# Patient Record
Sex: Male | Born: 1955 | Hispanic: No | Marital: Married | Smoking: Current some day smoker
Health system: Southern US, Community
[De-identification: ages and names within clinical notes are randomized; demographics above are authoritative.]

## PROBLEM LIST (undated history)

## (undated) ENCOUNTER — Emergency Department (HOSPITAL_COMMUNITY): Admission: EM | Payer: 59 | Source: Home / Self Care

## (undated) DIAGNOSIS — R002 Palpitations: Secondary | ICD-10-CM

## (undated) DIAGNOSIS — J189 Pneumonia, unspecified organism: Secondary | ICD-10-CM

## (undated) DIAGNOSIS — N289 Disorder of kidney and ureter, unspecified: Secondary | ICD-10-CM

## (undated) DIAGNOSIS — K219 Gastro-esophageal reflux disease without esophagitis: Secondary | ICD-10-CM

## (undated) DIAGNOSIS — J45909 Unspecified asthma, uncomplicated: Secondary | ICD-10-CM

## (undated) DIAGNOSIS — E785 Hyperlipidemia, unspecified: Secondary | ICD-10-CM

## (undated) DIAGNOSIS — R338 Other retention of urine: Secondary | ICD-10-CM

## (undated) DIAGNOSIS — I10 Essential (primary) hypertension: Secondary | ICD-10-CM

## (undated) DIAGNOSIS — M19012 Primary osteoarthritis, left shoulder: Secondary | ICD-10-CM

## (undated) DIAGNOSIS — J449 Chronic obstructive pulmonary disease, unspecified: Secondary | ICD-10-CM

## (undated) DIAGNOSIS — Z86010 Personal history of colonic polyps: Secondary | ICD-10-CM

## (undated) DIAGNOSIS — M1990 Unspecified osteoarthritis, unspecified site: Secondary | ICD-10-CM

## (undated) HISTORY — DX: Personal history of colonic polyps: Z86.010

## (undated) HISTORY — DX: Hyperlipidemia, unspecified: E78.5

## (undated) HISTORY — PX: CERVICAL FUSION: SHX112

---
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
## 1999-05-14 ENCOUNTER — Emergency Department (HOSPITAL_COMMUNITY): Admission: EM | Admit: 1999-05-14 | Discharge: 1999-05-14 | Payer: Self-pay | Admitting: Emergency Medicine

## 1999-05-14 ENCOUNTER — Encounter: Payer: Self-pay | Admitting: Emergency Medicine

## 1999-12-25 ENCOUNTER — Encounter: Payer: Self-pay | Admitting: Emergency Medicine

## 1999-12-25 ENCOUNTER — Emergency Department (HOSPITAL_COMMUNITY): Admission: EM | Admit: 1999-12-25 | Discharge: 1999-12-25 | Payer: Self-pay | Admitting: Emergency Medicine

## 2000-06-12 ENCOUNTER — Emergency Department (HOSPITAL_COMMUNITY): Admission: EM | Admit: 2000-06-12 | Discharge: 2000-06-12 | Payer: Self-pay | Admitting: Emergency Medicine

## 2001-08-30 ENCOUNTER — Encounter: Payer: Self-pay | Admitting: Emergency Medicine

## 2001-08-30 ENCOUNTER — Emergency Department (HOSPITAL_COMMUNITY): Admission: EM | Admit: 2001-08-30 | Discharge: 2001-08-30 | Payer: Self-pay | Admitting: Emergency Medicine

## 2002-02-02 ENCOUNTER — Emergency Department (HOSPITAL_COMMUNITY): Admission: EM | Admit: 2002-02-02 | Discharge: 2002-02-02 | Payer: Self-pay | Admitting: Emergency Medicine

## 2002-02-04 ENCOUNTER — Emergency Department (HOSPITAL_COMMUNITY): Admission: EM | Admit: 2002-02-04 | Discharge: 2002-02-04 | Payer: Self-pay | Admitting: Emergency Medicine

## 2002-10-16 ENCOUNTER — Encounter: Payer: Self-pay | Admitting: *Deleted

## 2002-10-16 ENCOUNTER — Emergency Department (HOSPITAL_COMMUNITY): Admission: EM | Admit: 2002-10-16 | Discharge: 2002-10-16 | Payer: Self-pay | Admitting: *Deleted

## 2006-02-07 ENCOUNTER — Encounter (INDEPENDENT_AMBULATORY_CARE_PROVIDER_SITE_OTHER): Payer: Self-pay | Admitting: Cardiology

## 2006-02-07 ENCOUNTER — Ambulatory Visit: Payer: Self-pay | Admitting: *Deleted

## 2006-02-07 ENCOUNTER — Observation Stay (HOSPITAL_COMMUNITY): Admission: EM | Admit: 2006-02-07 | Discharge: 2006-02-08 | Payer: Self-pay | Admitting: Emergency Medicine

## 2006-02-21 ENCOUNTER — Ambulatory Visit: Payer: Self-pay | Admitting: *Deleted

## 2006-03-22 ENCOUNTER — Ambulatory Visit: Payer: Self-pay | Admitting: Cardiology

## 2006-03-22 ENCOUNTER — Ambulatory Visit: Payer: Self-pay

## 2006-05-05 ENCOUNTER — Ambulatory Visit: Payer: Self-pay | Admitting: Cardiology

## 2006-05-25 ENCOUNTER — Inpatient Hospital Stay (HOSPITAL_COMMUNITY): Admission: EM | Admit: 2006-05-25 | Discharge: 2006-05-29 | Payer: Self-pay | Admitting: Emergency Medicine

## 2006-05-25 ENCOUNTER — Ambulatory Visit: Payer: Self-pay | Admitting: Internal Medicine

## 2006-06-21 ENCOUNTER — Encounter (INDEPENDENT_AMBULATORY_CARE_PROVIDER_SITE_OTHER): Payer: Self-pay | Admitting: Internal Medicine

## 2006-06-21 ENCOUNTER — Ambulatory Visit: Payer: Self-pay | Admitting: Internal Medicine

## 2006-06-21 DIAGNOSIS — H524 Presbyopia: Secondary | ICD-10-CM

## 2006-06-21 DIAGNOSIS — G4700 Insomnia, unspecified: Secondary | ICD-10-CM | POA: Insufficient documentation

## 2006-06-21 DIAGNOSIS — I10 Essential (primary) hypertension: Secondary | ICD-10-CM | POA: Insufficient documentation

## 2006-06-21 DIAGNOSIS — L03319 Cellulitis of trunk, unspecified: Secondary | ICD-10-CM

## 2006-06-21 DIAGNOSIS — E785 Hyperlipidemia, unspecified: Secondary | ICD-10-CM | POA: Insufficient documentation

## 2006-06-21 DIAGNOSIS — F17200 Nicotine dependence, unspecified, uncomplicated: Secondary | ICD-10-CM

## 2006-06-21 DIAGNOSIS — L02219 Cutaneous abscess of trunk, unspecified: Secondary | ICD-10-CM | POA: Insufficient documentation

## 2006-06-21 LAB — CONVERTED CEMR LAB
BUN: 13 mg/dL (ref 6–23)
CO2: 23 meq/L (ref 19–32)
Creatinine, Ser: 1.49 mg/dL (ref 0.40–1.50)
Hemoglobin: 13 g/dL (ref 13.0–17.0)
MCHC: 33.9 g/dL (ref 30.0–36.0)
RBC: 4.26 M/uL (ref 4.22–5.81)
RDW: 14 % (ref 11.5–14.0)
WBC: 9.2 10*3/uL (ref 4.0–10.5)

## 2006-06-28 ENCOUNTER — Emergency Department (HOSPITAL_COMMUNITY): Admission: EM | Admit: 2006-06-28 | Discharge: 2006-06-29 | Payer: Self-pay | Admitting: Emergency Medicine

## 2006-07-05 ENCOUNTER — Ambulatory Visit: Payer: Self-pay | Admitting: Gastroenterology

## 2006-07-13 ENCOUNTER — Emergency Department (HOSPITAL_COMMUNITY): Admission: EM | Admit: 2006-07-13 | Discharge: 2006-07-13 | Payer: Self-pay | Admitting: Emergency Medicine

## 2006-10-13 ENCOUNTER — Emergency Department (HOSPITAL_COMMUNITY): Admission: EM | Admit: 2006-10-13 | Discharge: 2006-10-13 | Payer: Self-pay | Admitting: Family Medicine

## 2007-06-01 ENCOUNTER — Emergency Department (HOSPITAL_COMMUNITY): Admission: EM | Admit: 2007-06-01 | Discharge: 2007-06-01 | Payer: Self-pay | Admitting: Family Medicine

## 2007-06-20 ENCOUNTER — Emergency Department (HOSPITAL_COMMUNITY): Admission: EM | Admit: 2007-06-20 | Discharge: 2007-06-20 | Payer: Self-pay | Admitting: Emergency Medicine

## 2007-06-21 ENCOUNTER — Ambulatory Visit: Payer: Self-pay | Admitting: Internal Medicine

## 2007-06-26 ENCOUNTER — Encounter: Payer: Self-pay | Admitting: Internal Medicine

## 2007-06-26 ENCOUNTER — Encounter (INDEPENDENT_AMBULATORY_CARE_PROVIDER_SITE_OTHER): Payer: Self-pay | Admitting: Internal Medicine

## 2007-06-26 ENCOUNTER — Ambulatory Visit: Payer: Self-pay | Admitting: Internal Medicine

## 2007-08-27 ENCOUNTER — Emergency Department (HOSPITAL_COMMUNITY): Admission: EM | Admit: 2007-08-27 | Discharge: 2007-08-27 | Payer: Self-pay | Admitting: Family Medicine

## 2008-06-17 ENCOUNTER — Emergency Department (HOSPITAL_COMMUNITY): Admission: EM | Admit: 2008-06-17 | Discharge: 2008-06-17 | Payer: Self-pay | Admitting: Emergency Medicine

## 2008-09-02 ENCOUNTER — Emergency Department (HOSPITAL_COMMUNITY): Admission: EM | Admit: 2008-09-02 | Discharge: 2008-09-02 | Payer: Self-pay | Admitting: Emergency Medicine

## 2008-09-04 ENCOUNTER — Emergency Department (HOSPITAL_COMMUNITY): Admission: EM | Admit: 2008-09-04 | Discharge: 2008-09-04 | Payer: Self-pay | Admitting: Emergency Medicine

## 2010-05-04 ENCOUNTER — Emergency Department (HOSPITAL_COMMUNITY)
Admission: EM | Admit: 2010-05-04 | Discharge: 2010-05-04 | Payer: Self-pay | Source: Home / Self Care | Admitting: Emergency Medicine

## 2010-05-04 LAB — DIFFERENTIAL
Neutro Abs: 3.8 10*3/uL (ref 1.7–7.7)
Neutrophils Relative %: 42 % — ABNORMAL LOW (ref 43–77)

## 2010-05-04 LAB — COMPREHENSIVE METABOLIC PANEL
AST: 25 U/L (ref 0–37)
Albumin: 3.6 g/dL (ref 3.5–5.2)
Calcium: 9.3 mg/dL (ref 8.4–10.5)
Creatinine, Ser: 1.13 mg/dL (ref 0.4–1.5)
Glucose, Bld: 82 mg/dL (ref 70–99)
Potassium: 4 mEq/L (ref 3.5–5.1)
Sodium: 140 mEq/L (ref 135–145)
Total Bilirubin: 0.3 mg/dL (ref 0.3–1.2)

## 2010-05-04 LAB — LIPASE, BLOOD: Lipase: 23 U/L (ref 11–59)

## 2010-05-04 LAB — CBC: MCH: 32.6 pg (ref 26.0–34.0)

## 2010-05-05 ENCOUNTER — Encounter (HOSPITAL_COMMUNITY): Payer: Self-pay | Attending: Oncology

## 2010-05-05 DIAGNOSIS — K625 Hemorrhage of anus and rectum: Secondary | ICD-10-CM | POA: Insufficient documentation

## 2010-05-05 DIAGNOSIS — Z8711 Personal history of peptic ulcer disease: Secondary | ICD-10-CM | POA: Insufficient documentation

## 2010-07-12 LAB — URINALYSIS, ROUTINE W REFLEX MICROSCOPIC
Bilirubin Urine: NEGATIVE
Glucose, UA: NEGATIVE mg/dL
Hgb urine dipstick: NEGATIVE
Ketones, ur: NEGATIVE mg/dL
Nitrite: NEGATIVE
Protein, ur: NEGATIVE mg/dL
Specific Gravity, Urine: 1.015 (ref 1.005–1.030)
Urobilinogen, UA: 0.2 mg/dL (ref 0.0–1.0)
pH: 5.5 (ref 5.0–8.0)

## 2010-07-12 LAB — DIFFERENTIAL
Eosinophils Absolute: 0.7 10*3/uL (ref 0.0–0.7)
Lymphocytes Relative: 26 % (ref 12–46)
Monocytes Absolute: 0.5 10*3/uL (ref 0.1–1.0)
Monocytes Relative: 4 % (ref 3–12)
Neutrophils Relative %: 62 % (ref 43–77)

## 2010-07-12 LAB — CBC
HCT: 43.9 % (ref 39.0–52.0)
Platelets: 161 10*3/uL (ref 150–400)
RBC: 4.72 MIL/uL (ref 4.22–5.81)

## 2010-07-12 LAB — BASIC METABOLIC PANEL
BUN: 10 mg/dL (ref 6–23)
CO2: 29 mEq/L (ref 19–32)
Calcium: 9.6 mg/dL (ref 8.4–10.5)
Chloride: 100 mEq/L (ref 96–112)
Creatinine, Ser: 1.24 mg/dL (ref 0.4–1.5)
GFR calc Af Amer: >60 mL/min (ref >60)
GFR calc non Af Amer: >60 mL/min (ref >60)
Glucose, Bld: 93 mg/dL (ref 70–99)
Potassium: 4.2 mEq/L (ref 3.5–5.1)
Sodium: 136 mEq/L (ref 135–145)

## 2010-07-15 LAB — CBC
MCHC: 34.1 g/dL (ref 30.0–36.0)
MCV: 94.3 fL (ref 78.0–100.0)
WBC: 7.9 10*3/uL (ref 4.0–10.5)

## 2010-07-15 LAB — URINALYSIS, ROUTINE W REFLEX MICROSCOPIC
Glucose, UA: NEGATIVE mg/dL
Ketones, ur: NEGATIVE mg/dL
Leukocytes, UA: NEGATIVE
Specific Gravity, Urine: 1.02 (ref 1.005–1.030)
Urobilinogen, UA: 0.2 mg/dL (ref 0.0–1.0)

## 2010-07-15 LAB — DIFFERENTIAL
Basophils Absolute: 0 10*3/uL (ref 0.0–0.1)
Eosinophils Relative: 11 % — ABNORMAL HIGH (ref 0–5)
Monocytes Relative: 5 % (ref 3–12)

## 2010-07-15 LAB — BASIC METABOLIC PANEL
BUN: 10 mg/dL (ref 6–23)
GFR calc Af Amer: >60 mL/min (ref >60)
Glucose, Bld: 99 mg/dL (ref 70–99)
Potassium: 4.5 mEq/L (ref 3.5–5.1)
Sodium: 138 mEq/L (ref 135–145)

## 2010-07-15 LAB — LIPASE, BLOOD: Lipase: 15 U/L (ref 11–59)

## 2010-08-17 NOTE — Assessment & Plan Note (Signed)
[facility]                         GASTROENTEROLOGY OFFICE NOTE   JODEN, BONSALL                     MRN:          [id number]  DATE:06/21/2007                            DOB:          December 22, 1955    CHIEF COMPLAINT:  Blood in stool.   HISTORY:  This 55 year old African-American man was in the emergency  room last night, complaining of melena, or black stools.  In talking to  him, he has really been having some left-sided abdominal pain off and on  and some bloating and some difficulty moving his bowels.  He has had  bright red blood per rectum and darker red blood per rectum.  I am not  really sure he has had melena.  He has not been using any significant  amount of NSAIDs.  He has been using a colon cleanse, ordered from TV,  to help himself move his bowels.  His hemoglobin was 14 when he was in  the emergency department.  They called Dr. Juanda Chance, who was on call, and  he was told to come see [place] in the office today for further evaluation.  The ER records indicate he was having a burning epigastric pain.  His  INR was normal.  His BUN was 12 and his creatinine was 1.2.  He had been  on some Zantac.  He was prescribed Protonix and Percocet last night,  which he has not yet started.  He says he has a history of ulcer disease  in 29.   MEDICATIONS:  1. Protonix 40 mg daily, not yet started.  2. Vitamin E.  3. A cardio supplement.  4. Colon cleanse intermittently.  5. Percocet p.r.n.   ALLERGIES:  He has a SHELLFISH and BEE STING allergies, but no known  drug allergies.  Also IODINE and increased pruritus after administration  of CONTRAST for CT angio.   PAST MEDICAL HISTORY:  Hypertension, epididymitis, inguinal abscess,  cellulitis.  Multiple sexually-transmitted diseases.  Dyslipidemia.  Childhood asthma.   PAST SURGICAL HISTORY:  Left-wrist tendon repair, 1998.  Left  arthroscopic knee surgery, 2001.   FAMILY HISTORY:  Grandmother  and a brother have had some type of  bleeding problems, but I do not think that was a bleeding disorder; I  think they had bleeding.   Our review is otherwise negative.   SOCIAL HISTORY:  He indicates that he is separated.  Three sons, two  daughters.  He lives with another woman.  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  He smokes one pack per day.  He denies current drug use or  alcohol.   REVIEW OF SYSTEMS:  Sinus problems, cough, joint pain, insomnia,  palpitations, eyeglasses and visual difficulty.  All other systems are  negative.   PHYSICAL EXAM:  Height 5 feet 11, weight 229 pounds.  Blood pressure  130/78, pulse 64.  EYES:  Anicteric.  ENT:  Shows poor dentition, missing some, and those that remain are  carious.  NECK:  Supple without thyromegaly or mass.  LUNGS:  Clear.  HEART:  S1, S2, no murmurs or gallops.  ABDOMEN:  Soft, nontender, without organomegaly or mass, except for some  minimal left lower quadrant tenderness.  RECTAL EXAM:  Shows no significant stool.  There is perhaps a little  scanty, brown stool.  At the tip of my finger, there may be a polypoid  lesion or perhaps even a mass.  GU:  His prostate is normal.  LYMPHATIC:  No neck, supraclavicular or groin adenopathy.  EXTREMITIES:  Free of edema.  SKIN:  No acute rash.  NEUROLOGIC:  Cranial nerves II through XII intact.  PSYCHIATRIC:  He is alert and oriented times three, appropriate mood and  affect.   ASSESSMENT:  It sounds like he is having blood in his stool and some  change in bowels, for which he started the colon cleanse.  He has had  rectal bleeding off and on for a number of years.  With the normal BUN,  I am less suspicious of the history that was gotten over the phone that  he might have an upper GI bleed.   PLAN:  Schedule colonoscopy.  An EGD cold be indicated, pending those  results.  Risks, benefits, and indications are explained.  He  understands and agrees to proceed.     Iva Boop, MD,FACG  Electronically Signed    CEG/MedQ  DD: 06/22/2007  DT: 06/22/2007  Job #: [id number]

## 2010-08-20 NOTE — Assessment & Plan Note (Signed)
[facility]                            CARDIOLOGY OFFICE NOTE   Brandon Ferrell, Brandon Ferrell                     MRN:          [id number]  DATE:05/05/2006                            DOB:          09-15-1955    Today's date is May 05, 2006.   Brandon Ferrell returns today for follow-up of his hypertension and  hyperlipidemia. Please see my note from March 22, 2006.   He had an exercise treadmill which showed physical deconditioning and an  exaggerated blood pressure response to exercise. I placed him on  lisinopril & hydrochlorothiazide and also recommended a Statin. He is  taking lisinopril & hydrochlorothiazide 25/25 daily. He is on  Atorvastatin, unknown dose daily.   He has lost 7 pounds and has begun a work out. He seems very motivated.  He is a former high school wrestler.   VITAL SIGNS: His blood pressure today is 135/87, his pulse is 64 and  regular. He is in no acute distress.  LUNGS: Clear.  NECK: There is no JVD. Carotids are full without bruits.  HEART: Reveals a regular rate and rhythm.  ABDOMEN: Soft, good bowel sounds.  EXTREMITIES: Reveal no edema. Pulses are brisk.   EKG today shows sinus bradycardia with nonspecific ST segment changes.   I have reinforced [name] today to continue his medications. I am  delighted that he has undertaken an exercise program and he is losing  weight. He is set to get lipids and a comprehensive metabolic panel in 4  weeks. I will see him back in 6 weeks.     Thomas C. Daleen Squibb, MD, [facility]  Electronically Signed    TCW/MedQ  DD: 05/05/2006  DT: 05/05/2006  Job #: [id number]

## 2010-08-20 NOTE — Op Note (Signed)
NAME:  Brandon Ferrell, Brandon Ferrell NO.:  [id number]   MEDICAL RECORD NO.:  [id number]          PATIENT TYPE:  INP   LOCATION:  5123                         FACILITY:  [facility]   PHYSICIAN:  Alfonse Ras, MD   DATE OF BIRTH:  1955-12-06   DATE OF PROCEDURE:  05/25/2006  DATE OF DISCHARGE:                               OPERATIVE REPORT   SURGEON:  Alfonse Ras, MD.   PROCEDURE:  Incision and drainage of left groin abscess.   ANESTHESIA:  Local.   DESCRIPTION:  I was asked to see the patient by the family practice  teaching service.  The patient had a superficial abscess in the left  inguinal crease, which was anesthetized with 1% lidocaine, incised, and  a small amount of purulent material was drained.  It was packed with  0.25% Iodoform.  Wound care instructions were recommended to the  teaching service to remove the packing in 2 days, continue antibiotics,  and probably discharge home within the next 24 or 48 hours.  I would be  happy to see him back p.r.n.      Alfonse Ras, MD  Electronically Signed     KRE/MEDQ  D:  05/25/2006  T:  05/26/2006  Job:  [id number]

## 2010-08-20 NOTE — Procedures (Signed)
[facility]                              EXERCISE TREADMILL   Brandon Ferrell                       MRN:          [id number]  DATE:03/22/2006                            DOB:          1955/04/10    HISTORY OF PRESENT ILLNESS:  Brandon Ferrell is a 55 year old black  gentleman who was recently admitted to [facility] with chest  pain.  He ruled out for myocardial infarction.  He has multiple risk  factors for cardiovascular disease including age, male sex, cocaine use,  and heavy tobacco use.  He also was borderline hypertensive in the  hospital.   His echocardiogram was essentially normal.   He was set up for an exercise treadmill to rule out obstructive coronary  disease.   His baseline blood pressure is 149/108, pulse was 57 and regular.  He  had just smoked a cigarette and rushed to the office and was late.   His baseline EKG was normal.  We exercised him on the standard Bruce  protocol.  He exercised for a total of 5 minutes, reaching a maximum  heart rate of 148 which is 87% of predicted maximum heart rate.  His MET  level achieved was 8.0.  Blood pressure increased to 228/95.  In  recovery, it dropped to 176/90.   There were no electrocardiographic changes of ischemia or arrhythmias.   1. Negative exercise tolerance test.  2. Baseline hypertension with exaggerated blood pressure response to      exercise.  3. Tobacco use.  4. Drug abuse.   I have had a long talk with Brandon Ferrell.  I have recommended  lisinopril/hydrochlorothiazide for his blood pressure.  He was given a  prescription for lisinopril/hydrochlorothiazide 20/25 daily.  He can get  this for several dollars a month at [facility].  He is also on generic  simvastatin which he can receive for the same price.  I will plan on  seeing him back in 2-3 weeks.     Thomas C. Daleen Squibb, MD, [facility]  Electronically Signed    TCW/MedQ  DD: 03/22/2006  DT: 03/22/2006  Job #:  [id number]

## 2010-08-20 NOTE — Discharge Summary (Signed)
NAME:  Brandon Ferrell, Brandon Ferrell NO.:  [id number]   MEDICAL RECORD NO.:  [id number]          PATIENT TYPE:  INP   LOCATION:  5123                         FACILITY:  [facility]   PHYSICIAN:  Carlus Pavlov, M.D. DATE OF BIRTH:  17-Jun-1955   DATE OF ADMISSION:  05/25/2006  DATE OF DISCHARGE:  05/29/2006                               DISCHARGE SUMMARY   DISCHARGE DIAGNOSES:  1. Inguinal abscess/cellulitis.  2. Epididymitis.  3. Ongoing drug abuse (crack, smoked).  4. Tobacco abuse.  5. History of chest pain that was ruled out for acute myocardial      infarction in November 2007 (by 2D echo, by EKG, by normal cardiac      enzymes and by a negative Cardiolite).  6. History of bright red blood per rectum with normal hemoglobin.  7. History of recurrent postcoital left testicular pain.  8. History of multiple sexually transmitted diseases.  9. Hyperlipidemia.  10.Bradycardia.  11.History of childhood asthma.  12.Status post left wrist tendon repair in 1998.  13.Status post left arthroscopic knee surgery in 2001.   ALLERGIES:  IODINE, BEE STINGS, SHELLFISH.  HE HAD INCREASED PRURITUS  AFTER ADMINISTRATION OF CONTRAST SUBSTANCE FOR A CT ANGIO.   MEDICATIONS AT DISCHARGE:  1. Pravastatin 40 mg p.o. daily.  2. Percocet 1-2 tablets every 4 hours p.r.n. pain.  The patient was      advised to take 2 tablets 30 minutes before dress changes.  3. Lasix 40 mg p.o. daily.  4. Ibuprofen 800 mg p.o. every 6 hours p.r.n. pain (not to be taken      for more than one week).  5. Doxycycline 100 mg p.o. b.i.d. for 7 days.  The patient was      provided with samples of doxycycline.   FOLLOWUP:  The patient has an appointment with Dr. Elvera Lennox in the  outpatient clinic on June 21, 2006, at 1:45 pm.  At that time, Dr.  Elvera Lennox is advised to check a BMET and a CBC.  Also she should check  resolution of the inguinal abscess.  The patient was advised by surgery  how to proceed with wound care  and himself and his girlfriend received  precise instructions about the dressings.   PROCEDURE:  A CT of the abdomen with contrast showed no acute finding in  the abdomen, and CT of the pelvis with contrast showed anterior midline  symphysis and perineal soft tissue thickening, edema and inflammation  with adjacent mild left inguinal adenopathy suspicious for an  inflammatory infectious process, less likely trauma.  This also involves  the left hemiscrotum.  An ultrasound of the scrotum showed normal testicles, right epididymal  space, increased  color flow of the left epididymis consistent with  epididymitis and left varicocele.  All these studies were done May 25, 2006.   CONSULTATIONS:  Dr. Colin Benton with surgery.   BRIEF ADMITTING HISTORY AND PHYSICAL:  Mr. Sayed is a 55 year old  African-American male with a past medical history of furunculosis,  hyperlipidemia, crack cocaine abuse, recent history of chest pain was  recently ruled out for acute MI, and  with history of repeated STDs in  the past, now presenting with a week and a half of an inguinal furuncle  that he initially drained himself.  It drained a purulent fluid and then  blood.  Four days prior to admission, he started to feel a nut-sized  mass in the same place that was increasing in size day by day.  The  night prior to admission, the patient was awoken from sleep by 25/10  inguinal pain.  He felt fever, chills, and diaphoresis.  He came to the  ED in the morning.  He also complains that the skin of the of left  testicle being indurated and painful.  The pain is burning and radiates  down his left leg.  He feels a pulling sensation when he extends the  left toe.  Pain is better when scrotum is elevated. He has no discharge  from the penis and no testicle pain.   PHYSICAL EXAMINATION:  VITAL SIGNS:  On admission, temperature 99.3.  Blood pressure 146/94.  Pulse 76.  Respiration rate 18.  Oxygen  saturation 97% on  room air.  GENERAL:  The patient was resting in bed, complaining of pain, wincing  when turning in bed.  EYES:  PERRLA with myotic pupils (the patient received Dilaudid).  Extraocular movements intact.  Arcus senilis.  ENT:  Clear oropharyngeal and membranes.  Enlarged tonsils.  Poor  dentition.  NECK:  Supple, no thyromegaly.  RESPIRATORY:  Clear to auscultation bilaterally.  CARDIOVASCULAR:  Bradycardia, no murmurs, rubs or gallops.  GI:  Soft, nontender, nondistended.  Bowel sounds positive.  EXTREMITIES:  No edema.  GU:  No CVA tenderness.  Abscess in left inguinal fold, elongated,  indurated.  Skin was broken in one place, but not draining.  The skin on  scrotum was indurated.  The pain was better with lifting the scrotum  (Prehn sign).  Marland Kitchen  SKIN:  Moist.  MENTAL STATUS:  He was alert and oriented x3.  NEURO:  Cranial nerves II-XII grossly intact. Nonfocal.  Strength 5/5 in  all four.  MUSCULOSKELETAL:  Increased muscular tone.  He has a knot in the left  lumbar area which was painful to pressing.  PSYCH:  Appropriate.   LABORATORY DATA:  Sodium 138, potassium 3.9, chloride 104, bicarbonate  24, BUN 11, creatinine 1.31, glucose 96 and anion gap 10, AST 21, ALT  16, calcium 9.1, white blood cells 12.4 with an ANC of 7.2, hemoglobin  12.7, hematocrit 37.3, thrombocytes 214, MCV 93.  Urinalysis:  The urine  was clear, pH 6.5, density 1.028.  The rest was negative.  UDS was  positive for cocaine.  GC and chlamydia in urine were negative.  RPR was  nonreactive.  Blood cultures x2 were negative and urine culture x1 was  negative.   ASSESSMENT/PLAN:  1. Left inguinal abscess/cellulitis.  On clinical exam, the inguinal      region looked inflamed, with a punctiform break in the skin      overlying the abscess, however it was not draining; on palpation,      there is an indurated mass of about 1 cm per 4 cm.  It was felt     that this was an abscess as opposed to cellulitis.  However  on the      CT angiography, it looked like a cellulitis without an abscess      collection.  Due to the fact that it was in the inguinal region and  has likely spread to the scrotum, we were concerned for Fournier      gangrene and we called surgery for possible drainage.  We have      medicated the patient with Dilaudid p.r.n. and empiric antibiotics      initially with vancomycin and Zosyn and then we switched to p.o.      doxycycline.  The patient was in considerable pain.  We had      increased the dose of Dilaudid during his hospital stay and we have      alternated with Percocet.  The patient was discharged on Percocet.      Towards the end of his stay, his pain started to diminish.  He was      incised and drained by surgery who were able to express a small      quantity of pus from the abscess; however, cultures were not sent      so we do not have sensitivities and a specific bacteria to cover.      We have discharged the patient on doxycycline for concern of      methicillin-resistant Staphylococcus aureus.   1. Epididymitis.  This is possibly a consequence of the spreading      abscess; however, we have tested for [facility] and chlamydia since the      patient had repeated sexually transmitted diseases in the past.  We      have advised him to keep scrotum elevated and to use ice packings.      He received Solu-Medrol 125 mg intravenous in the emergency      department.  We also started him in ibuprofen 800 t.i.d. and      administered Dilaudid and Percocet.  Towards the end of his stay,      his edema decreased and his pain started to resolve.   1. Ongoing substance abuse (smokes crack).  Patient was advised about      risks of this behavior especially in the context of a previous      chest pain.  He says he tries to cut down.  He was, however,      advised to stop completely.  At previous admissions, he has been      referred to Narcotics Anonymous obviously without full  effect (UDS      positive for cocaine at this admission).   1. Tobacco abuse.  We asked for a smoking cessation consult.  We are      providing him with a NicoDerm patch.   1. History of chest pain.  He was admitted in November of last year      and he was ruled out for myocardial infarction by EKG, 2D echo,      cardiac enzymes and also negative Cardiolite in outpatient.  We      checked an EKG that showed sinus bradycardia and also specific ST      and T-wave abnormalities.  This was unchanged since his EKG in      November of 2007.  One set of cardiac enzymes were normal.  The      patient was bradycardic his entire admission and this is likely      patient's baseline as he exercises on a regular basis.   1. Hyperlipidemia.  We have checked a fasting lipid profile that      showed total cholesterol 216, LDL cholesterol of 156 and HDL      cholesterol of 44 and  triglycerides of 81.  We have continued his     home Lipitor in the hospital, 40 mg daily however at discharge we      have placed him on Pravastatin secondary to monetary problems.      Pravastatin is $4.00 at [facility].   1. Hypertension.  He is on Lasix at home and we will continue this      regimen 40 mg daily.  He was very well controlled in the hospital      on Lasix 40 mg daily.   1. Prophylaxis.  He was on Protonix and he did not necessitate Lovenox      since he was ambulating well.   VITALS AT DISCHARGE:  Temperature 98.7, pulse 56, respirations 16, blood  pressure 139/60, oxygen saturation 96 on room air.  White blood count  9.5, neutrophils 46%, hemoglobin 13.8, hematocrit 39.2, platelets 242,  BMET one day before admission showed sodium 135, potassium 3.5, chloride  101, bicarbonate 29, glucose 89, BUN 9, creatinine 1.15, calcium 8.1.      Carlus Pavlov, M.D.  Electronically Signed     CG/MEDQ  D:  05/31/2006  T:  06/01/2006  Job:  [id number]   cc:   Alfonse Ras, MD

## 2010-08-20 NOTE — Discharge Summary (Signed)
NAME:  Brandon Ferrell, Brandon Ferrell NO.:  [id number]   MEDICAL RECORD NO.:  [id number]          PATIENT TYPE:  INP   LOCATION:  4705                         FACILITY:  [facility]   PHYSICIAN:  Antony Contras, M.D.     DATE OF BIRTH:  1955-08-03   DATE OF ADMISSION:  02/07/2006  DATE OF DISCHARGE:  02/08/2006                                 DISCHARGE SUMMARY   DISCHARGE DIAGNOSES:  1. Chest pain, rule out for myocardial infarction.  Needs further workup      for potential unstable angina.  2. Crack cocaine abuse.  3. Ongoing tobacco abuse.  4. History of bright red blood per rectum, with normal hemoglobin.  5. History of recurrent postcoital left testicular pain.  6. Bradycardia, asymptomatic, and potentially the patient's baseline.  7. No established health care.   DISCHARGE MEDICATIONS:  1. Zocor 20 mg p.o. daily.  2. Aspirin 81 mg p.o. daily.  3. Nitroglycerin 0.4 mg sublingual every 5 minutes x3 p.r.n. chest pain.   DISPOSITION AND FOLLOWUP:  Brandon Ferrell is being discharged from [facility] in stable condition.  His chest pain has resolved.  He is to follow  up with Dr. Manning Charity in the [facility] outpatient clinic on February 21, 2006, at 3:00 p.m.  At that time, the stress test being coordinated by  [facility] will ideally be complete and the results available for  Dr. Lyda Perone to review.  The patient will need a colonocopy at some point as  well.  Also for  Dr. Lyda Perone to decide if the patient should remain on statin  therapy given his LDL level of 122 and the risk factors the patient has.   On the day of discharge, [facility] was contacted regarding the  patient having an outpatient stress test.  Their card master will arrange  with the patient to have this set up, ideally within the next 7 days.  [facility] will call the patient with the time and date of his  appointment.   PROCEDURES PERFORMED:  1. On February 07, 2006, the patient had  a chest x-ray that revealed no      active cardiopulmonary disease.  2. On February 07, 2006, the patient had a 2-D echocardiogram performed,      which revealed the following:      a.     Left ventricular systolic function was normal with an EF between       55% and 65%.      b.     No regional wall motion abnormality was identified, but could       not be completely excluded on the basis of this study.      c.     Left ventricular wall thickness moderately to markedly       increased.      d.     Left ventricular diastolic function parameters normal.      e.     Aortic root upper limits of normal in size.      f.     Atrial size  upper limits of normal.   CONSULTATIONS:  [facility] was contacted regarding establishment of  an outpatient stress test.   BRIEF ADMISSION HISTORY AND PHYSICAL:  Brandon Ferrell is a pleasant 55 year old  African American man with a past medical history of approximately 40-pack-  year smoking history, as well as ongoing crack cocaine abuse, who presents  with a 2-day history of chest pain.  The pain is located in the central  chest with radiation into the left shoulder and left upper arm.  The patient  first noticed the chest pain beginning approximately 1 month ago after  exerting himself physically.  The pain, the patient reports, lasts  approximately 10 to 15 minutes and typically resolves on its own.  One day  prior to arrival, the patient was lying in bed when he had 12 out of 10  chest pain radiating into the left arm and shoulder.  It resolved on its own  after about 10 minutes.  He went to sleep, but awoke the next morning with  cough, chest tightness, nausea, diaphoresis, and generalized malaise.  The  chest pain was now persistent.  It had been relieved by morphine  administrated in the emergency department by the time I evaluated the  patient.   On review of systems, he also reported shortness of breath during the chest  pain, as well as a  cough that is productive of dark sputum.  No  hemoptysis.  On further review of systems, the patient reports that he has  left testicular pain postcoitally.  There is no discharge and no dysuria.  He does have a history of multiple sexually transmitted infections in the  past.  The patient also reported 1 episode of bright red blood per rectum.  He has a history of hemorrhoidal bleeds.  He has never had a colonoscopy.  He quantifies the blood as drops of blood in the toilet following a normal  caliber stool and drops of blood on the toilet paper following cleaning  himself.  For further details, please see the chart.   PAST MEDICAL HISTORY:  The patient has childhood asthma that was resolved.  He has had a left wrist tendon repair in 1998.  He had a left arthroscopic  knee surgery in 2001.  No further past medical history.   PHYSICAL EXAMINATION:  VITAL SIGNS:  Temperature 97.6.  Blood pressure  139/89.  Pulse 57.  Respiratory rate 18.  O2 sat 98% on room air.  GENERAL:  This is a well-developed, well-nourished African American man  resting comfortable in bed in no apparent distress.  HEENT:  He had 1+ conjunctival injection.  Extraocular muscles intact.  Pupils equally round and reactive to light.  He had arcus senilis  bilaterally.  Oropharynx revealed very poor dentition.  He had no erythema  and no exudate.  NECK:  Supple and nontender.  He had no notable JVD or thyromegaly.  LUNGS:  Good air movement.  Clear to auscultation bilaterally.  No wheezes,  rhonchi, or rales.  CARDIOVASCULAR:  He was bradycardic, but regular.  S1, S2.  No murmurs,  rubs, or gallops.  ABDOMEN:  Normoactive bowel sounds, soft, nontender, and nondistended.  EXTREMITIES:  No cyanosis, clubbing, or edema.  SKIN:  Warm and dry.  LYMPHATIC:  He had no lymphadenopathy appreciated. NEUROLOGIC:  He was moving all 4 extremities spontaneously.  He was alert  and oriented x3.  He had no focal neurological deficits.   He had normal  sensation  throughout.  His mood was best described as anxious. His affect  was full.   ADMISSION LABORATORY DATA:  Point-of-care cardiac enzymes were normal.  Myoglobin 89.  CK-MB 1.8.  Troponin less than 0.05.  Sodium of 139.  Potassium 4.1.  Chloride 108.  Bicarb 27.2.  BUN 13.  Creatinine 1.4.  Glucose 89.  He had a white count of 8.8.  Hemoglobin 14.  Platelets 208.  A1c of 3.7.  ANC 3.7.  MCV of 93.  His urine drug screen was positive for  opiates, as well as cocaine.  His fecal occult blood negative.  Of note, the  patient did receive morphine in the ED approximately 1/2 of an hour before  the urine drug screen.   HOSPITAL COURSE:  1. Chest pain.  The patient was admitted for 23-hour observation to a      telemetry bed.  He had several sets of cardiac markers cycled, all of      which were normal.  He had several repeat EKGs, all of which were      normal.  There were no ST changes that were concerning.  He did have      some nonspecific T-wave changes in lead 3; otherwise, it was      unremarkable, other than the bradycardia, as mentioned.  The treatment      team felt comfortable, based on the patient's cardiac risk factors, for      allowing the patient to go home, which he was quite adamant about.      Given his history of crack cocaine abuse, as well as the story of being      so concerning for angina, we set him up for a stress test.  [facility] gratefully assisted in setting this up.  Ideally, those      results will be available by the time he sees Dr. Lyda Perone at the      outpatient clinic.   1. Bradycardia.  Throughout this hospitalization, the patient's heart rate      was noted to be in the mid to low 50s.  The patient was entirely stable      throughout his hospitalization and was asymptomatic from the      bradycardia.  One could presume this is from parasympathetic tone.  The      patient is notably in shape despite his ongoing crack  cocaine abuse.      He does work out several days a week.  I think this represents his      baseline.   1. Polysubstance abuse.  The patient was counseled while in-house by      alcohol and drug services and offered materials on drug abuse and      addiction.  He was also referred to Narcotics Anonymous.  The patient      reports that this hospitalization was a scare/wake-up call for him.  I      think his involvement in Narcotics Anonymous will represent his best      chance of remaining drug free.   1. Hyperlipidemia.  The patient was discharged on statin therapy.  A      fasting lipid panel while in-house revealed the following:  total      cholesterol of 210, LDL of 122, HCL of 35, and triglycerides of 260.  I      think depending on his risk factors, he could or could not stand to  be      on a statin.  Again, I will defer to his primary care physician, which     at this time is Dr. Lyda Perone; however, I will be more than happy to see him      as a continuity patient in the outpatient clinic.   1. Bright red blood per rectum.  The patient's description of the bleeding      is most consistent with hemorrhoidal bleeding; however, given his age      and the fact that he has never had any established health care, I think      it would be best to set the patient up for an outpatient colonoscopy.      Either Dr. Lyda Perone or myself can do this.  Of note, the patient's      hemoglobin was entirely normal throughout the course of his      hospitalization.  Discharge was 13.2.   DISCHARGE LABS:  White blood cell count 10.8.  Hemoglobin 13.2.  Platelets  208.  Sodium 139.  Potassium 3.8.  Chloride 112.  Bicarb 26.  BUN 12.  Creatinine 1.5.  Glucose 96.  Cardiac markers were all normal.  GC negative.  HIV nonreactive.  Chlamydia negative.  Syphilis RPR was negative.      Antony Contras, M.D.  Electronically Signed     GL/MEDQ  D:  02/09/2006  T:  02/10/2006  Job:  [id number]

## 2010-12-27 LAB — DIFFERENTIAL
Basophils Absolute: 0.1
Basophils Relative: 1
Monocytes Relative: 6
Neutro Abs: 4.7
Neutrophils Relative %: 49

## 2010-12-27 LAB — COMPREHENSIVE METABOLIC PANEL
Alkaline Phosphatase: 59
BUN: 12
Glucose, Bld: 116 — ABNORMAL HIGH
Potassium: 4.1
Total Bilirubin: 0.5
Total Protein: 6.5

## 2010-12-27 LAB — CBC
HCT: 40.7
Hemoglobin: 14.2
MCV: 92.6
RDW: 13.6

## 2010-12-27 LAB — PROTIME-INR
INR: 0.9
Prothrombin Time: 12.7

## 2011-04-05 HISTORY — PX: LUNG BIOPSY: SHX232

## 2011-06-21 ENCOUNTER — Encounter (HOSPITAL_COMMUNITY): Payer: Self-pay | Admitting: *Deleted

## 2011-06-21 ENCOUNTER — Emergency Department (HOSPITAL_COMMUNITY)
Admission: EM | Admit: 2011-06-21 | Discharge: 2011-06-21 | Disposition: A | Discharge status: Home / Self Care | Payer: Self-pay | Attending: Emergency Medicine | Admitting: Emergency Medicine

## 2011-06-21 ENCOUNTER — Emergency Department (HOSPITAL_COMMUNITY): Payer: Self-pay

## 2011-06-21 ENCOUNTER — Other Ambulatory Visit: Payer: Self-pay

## 2011-06-21 DIAGNOSIS — R112 Nausea with vomiting, unspecified: Secondary | ICD-10-CM | POA: Insufficient documentation

## 2011-06-21 DIAGNOSIS — R142 Eructation: Secondary | ICD-10-CM | POA: Insufficient documentation

## 2011-06-21 DIAGNOSIS — R197 Diarrhea, unspecified: Secondary | ICD-10-CM | POA: Insufficient documentation

## 2011-06-21 DIAGNOSIS — M545 Low back pain, unspecified: Secondary | ICD-10-CM | POA: Insufficient documentation

## 2011-06-21 DIAGNOSIS — R141 Gas pain: Secondary | ICD-10-CM | POA: Insufficient documentation

## 2011-06-21 DIAGNOSIS — R079 Chest pain, unspecified: Secondary | ICD-10-CM | POA: Insufficient documentation

## 2011-06-21 DIAGNOSIS — R109 Unspecified abdominal pain: Secondary | ICD-10-CM | POA: Insufficient documentation

## 2011-06-21 DIAGNOSIS — R61 Generalized hyperhidrosis: Secondary | ICD-10-CM | POA: Insufficient documentation

## 2011-06-21 DIAGNOSIS — H5710 Ocular pain, unspecified eye: Secondary | ICD-10-CM | POA: Insufficient documentation

## 2011-06-21 HISTORY — DX: Palpitations: R00.2

## 2011-06-21 LAB — DIFFERENTIAL
Basophils Absolute: 0 10*3/uL (ref 0.0–0.1)
Basophils Relative: 0 % (ref 0–1)
Neutro Abs: 6.4 10*3/uL (ref 1.7–7.7)
Neutrophils Relative %: 74 % (ref 43–77)

## 2011-06-21 LAB — URINALYSIS, ROUTINE W REFLEX MICROSCOPIC
Glucose, UA: NEGATIVE mg/dL
Ketones, ur: NEGATIVE mg/dL
Leukocytes, UA: NEGATIVE
pH: 6.5 (ref 5.0–8.0)

## 2011-06-21 LAB — COMPREHENSIVE METABOLIC PANEL
AST: 21 U/L (ref 0–37)
Albumin: 3.8 g/dL (ref 3.5–5.2)
Alkaline Phosphatase: 80 U/L (ref 39–117)
Chloride: 102 mEq/L (ref 96–112)
Creatinine, Ser: 1.1 mg/dL (ref 0.50–1.35)
Potassium: 4.3 mEq/L (ref 3.5–5.1)
Total Bilirubin: 0.4 mg/dL (ref 0.3–1.2)

## 2011-06-21 LAB — CBC
MCHC: 35.4 g/dL (ref 30.0–36.0)
Platelets: 149 10*3/uL — ABNORMAL LOW (ref 150–400)
RDW: 13.6 % (ref 11.5–15.5)

## 2011-06-21 LAB — TROPONIN I: Troponin I: <0.3 ng/mL (ref <0.30)

## 2011-06-21 MED ORDER — OMEPRAZOLE 20 MG PO CPDR: 20.0000 mg | DELAYED_RELEASE_CAPSULE | Freq: Every day | ORAL | Status: AC

## 2011-06-21 MED ORDER — HYDROMORPHONE HCL PF 1 MG/ML IJ SOLN
1.0000 mg | Freq: Once | INTRAMUSCULAR | Status: AC
  Administered 2011-06-21: 1 mg via INTRAVENOUS
  Filled 2011-06-21: qty 1

## 2011-06-21 MED ORDER — ONDANSETRON HCL 4 MG/2ML IJ SOLN
4.0000 mg | Freq: Once | INTRAMUSCULAR | Status: AC
  Administered 2011-06-21: 4 mg via INTRAVENOUS
  Filled 2011-06-21: qty 2

## 2011-06-21 MED ORDER — IOHEXOL 300 MG/ML  SOLN
100.0000 mL | Freq: Once | INTRAMUSCULAR | Status: AC | PRN
  Administered 2011-06-21: 100 mL via INTRAVENOUS

## 2011-06-21 MED ORDER — SODIUM CHLORIDE 0.9 % IV BOLUS (SEPSIS)
1000.0000 mL | Freq: Once | INTRAVENOUS | Status: AC
  Administered 2011-06-21: 1000 mL via INTRAVENOUS

## 2011-06-21 MED ORDER — ONDANSETRON HCL 4 MG PO TABS: 4.0000 mg | ORAL_TABLET | Freq: Four times a day (QID) | ORAL | Status: AC

## 2011-06-21 NOTE — ED Notes (Addendum)
Pt c/o chest tightness and vomiting since 5am last night

## 2011-06-21 NOTE — ED Notes (Signed)
Vomiting ,diarrhea since 5 am.  abd pain, back pain

## 2011-06-21 NOTE — ED Provider Notes (Signed)
History   This chart was scribed for Brandon Octave, MD by Brooks Sailors. The patient was seen in room APA08/APA08. Patient's care was started at 1152.   CSN: [id number]  Arrival date & time 06/21/11  1152   First MD Initiated Contact with Patient 06/21/11 1256      Chief Complaint  Patient presents with  . Emesis    (Consider location/radiation/quality/duration/timing/severity/associated sxs/prior treatment) HPI Brandon Ferrell is a 56 y.o. male who presents to the Emergency Department complaining of constant moderate nausea, vomiting and diarrhea onset this morning approximately 9 hours ago and persistent since with associated mild lower abdominal pain radiating to back. Patient has had four episodes of vomiting with associated cold sweats and pain behind the eyes. Patient's spouse recently had cold.  Patient also notes experiencing a temporary episode of sharp chest pain which began just prior to onset of diarrhea but subsided on its own. Denies h/o fever, alcohol/drug use, abdominal surgery, HTN, and pain in the testicles.  PCP Dr. Janna Arch  Past Medical History  Diagnosis Date  . Palpitations     History reviewed. No pertinent past surgical history.  History reviewed. No pertinent family history.  History  Substance Use Topics  . Smoking status: Current Everyday Smoker  . Smokeless tobacco: Not on file  . Alcohol Use: No      Review of Systems 10 Systems reviewed and are negative for acute change except as noted in the HPI.  Allergies  Bee venom and Shellfish allergy  Home Medications   Current Outpatient Rx  Name Route Sig Dispense Refill  . ADULT MULTIVITAMIN W/MINERALS CH Oral Take 1 tablet by mouth daily.    Marland Kitchen NAPROXEN SODIUM 220 MG PO TABS Oral Take 220 mg by mouth 2 (two) times daily as needed. Pain      BP 140/89  Pulse 72  Temp(Src) 98.8 F (37.1 C) (Oral)  Resp 16  Ht 5\' 11"  (1.803 m)  Wt 225 lb (102.059 kg)  BMI 31.38 kg/m2  SpO2  96%  Physical Exam  Nursing note and vitals reviewed. Constitutional: He is oriented to person, place, and time. He appears well-developed and well-nourished. No distress.  HENT:  Head: Normocephalic and atraumatic.  Eyes: EOM are normal. Pupils are equal, round, and reactive to light.  Neck: Neck supple. No tracheal deviation present.  Cardiovascular: Normal rate, regular rhythm, normal heart sounds and intact distal pulses.  Exam reveals no gallop and no friction rub.   No murmur heard. Pulmonary/Chest: Effort normal. No respiratory distress. He has no wheezes. He has no rales.  Abdominal: Soft. He exhibits distension. There is tenderness (mild diffuse).  Genitourinary: Penis normal.        Normal external genitalia.   Musculoskeletal: Normal range of motion. He exhibits no edema.  Neurological: He is alert and oriented to person, place, and time. No sensory deficit.  Skin: Skin is warm and dry.  Psychiatric: He has a normal mood and affect. His behavior is normal.    ED Course  Procedures (including critical care time) DIAGNOSTIC STUDIES: Oxygen Saturation is 100% on room air, normal by my interpretation.    COORDINATION OF CARE: 1.05PM-Patient informed of current plan for treatment and evaluation and agrees with plan at this time.  2:23PM- Patient reports nausea improved after administration of Zforan-4mg . Labs reviewed with patient. Still awaiting CT-Abdomen.    Labs Reviewed  CBC - Abnormal; Notable for the following:    Platelets 149 (*)  All other components within normal limits  DIFFERENTIAL - Abnormal; Notable for the following:    Eosinophils Relative 6 (*)    All other components within normal limits  COMPREHENSIVE METABOLIC PANEL - Abnormal; Notable for the following:    Glucose, Bld 102 (*)    GFR calc non Af Amer 74 (*)    GFR calc Af Amer 86 (*)    All other components within normal limits  URINALYSIS, ROUTINE W REFLEX MICROSCOPIC - Abnormal; Notable for  the following:    Color, Urine AMBER (*) BIOCHEMICALS MAY BE AFFECTED BY COLOR   All other components within normal limits  LIPASE, BLOOD  LACTIC ACID, PLASMA  TROPONIN I   Dg Chest 2 View  06/21/2011  *RADIOLOGY REPORT*  Clinical Data: Nausea, vomiting and diarrhea for the past day.  CHEST - 2 VIEW  Comparison: Chest x-ray 06/29/2006.  Findings: Lung volumes are normal.  No consolidative airspace disease.  No pleural effusions.  No pneumothorax.  No pulmonary nodule or mass noted.  Pulmonary vasculature and the cardiomediastinal silhouette are within normal limits.  IMPRESSION: 1. No radiographic evidence of acute cardiopulmonary disease.  Original Report Authenticated By: Florencia Reasons, M.D.   Ct Abdomen Pelvis W Contrast  06/21/2011  *RADIOLOGY REPORT*  Clinical Data: Nausea and vomiting today.  Cold sweats.  Sharp chest pain.  Hyper lipidemia.  Hypertension.  CT ABDOMEN AND PELVIS WITH CONTRAST  Technique:  Multidetector CT imaging of the abdomen and pelvis was performed following the standard protocol during bolus administration of intravenous contrast.  Contrast: OMNIPAQUE IOHEXOL 300 MG/ML IJ SOLN  Comparison: 09/02/2008  Findings: Clear lung bases.  Borderline cardiomegaly, without pericardial or pleural effusion.  The distal esophagus is underdistended.  Appears mildly thick-walled.  This is similar to on the 09/02/2008.  Normal liver.  A too small to characterize splenic lesion is unchanged on image 22.  Normal stomach, pancreas, gallbladder, biliary tract, right adrenal gland.  A hypoattenuating left adrenal nodule measures 7 mm on image 25 and is likely similar on the prior.  Normal kidneys. No retroperitoneal or retrocrural adenopathy.  Normal colon and terminal ileum.  The appendix is normal, including on images 63 and 64.  Small bowel loops measure up to 2.7 cm, upper normal.  No evidence of obstruction, ascites, or mesenteric abnormality.  No pelvic adenopathy.    Normal urinary  bladder and prostate.  No significant free fluid.  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  Marked facet arthropathy at the lumbosacral junction.  Disc bulges at L4-L5 and L5-S1.  IMPRESSION:  1. No acute process in the abdomen or pelvis. 2.  Apparent distal esophageal wall thickening.  Mild and at least partially  due to underdistension.  Similar to 09/02/2008. Correlate with symptoms to suggest esophagitis. 3.  Minimal left adrenal nodularity is unchanged and likely due to an underlying adenoma.  Present back to 05/25/2006.  Original Report Authenticated By: Consuello Bossier, M.D.     No diagnosis found.    MDM  Nausea, vomiting, diarrhea since 5 AM associated with diffuse abdominal pain. Nontoxic appearing, stable vitals. No sick contacts. Few minutes of sharp stabbing chest pain this morning now resolved. Is not associated with vomiting.  Labs reviewed and essentially unremarkable. Patient's nausea has improved. CT scan is not show any acute process but does show stable esophageal wall thickening.  EKG nonischemic with negative troponin. Patient's chest pain is atypical for angina.  I personally performed the  services described in this documentation, which was scribed in my presence.  The recorded information has been reviewed and considered.     Date: 06/21/2011  Rate: 72  Rhythm: normal sinus rhythm  QRS Axis: normal  Intervals: normal  ST/T Wave abnormalities: nonspecific ST/T changes  Conduction Disutrbances:none  Narrative Interpretation:   Old EKG Reviewed: unchanged    Brandon Octave, MD 06/21/11 1640

## 2011-06-21 NOTE — ED Notes (Signed)
Pt is aware we need a urine sample. 

## 2011-06-21 NOTE — ED Notes (Signed)
Pt is finished with contrast and CT notified.

## 2011-06-21 NOTE — Discharge Instructions (Signed)
Nausea and Vomiting  Nausea is a sick feeling that often comes before throwing up (vomiting). Vomiting is a reflex where stomach contents come out of your mouth. Vomiting can cause severe loss of body fluids (dehydration). Children and elderly adults can become dehydrated quickly, especially if they also have diarrhea. Nausea and vomiting are symptoms of a condition or disease. It is important to find the cause of your symptoms.  CAUSES    Direct irritation of the stomach lining. This irritation can result from increased acid production (gastroesophageal reflux disease), infection, food poisoning, taking certain medicines (such as nonsteroidal anti-inflammatory drugs), alcohol use, or tobacco use.   Signals from the brain.These signals could be caused by a headache, heat exposure, an inner ear disturbance, increased pressure in the brain from injury, infection, a tumor, or a concussion, pain, emotional stimulus, or metabolic problems.   An obstruction in the gastrointestinal tract (bowel obstruction).   Illnesses such as diabetes, hepatitis, gallbladder problems, appendicitis, kidney problems, cancer, sepsis, atypical symptoms of a heart attack, or eating disorders.   Medical treatments such as chemotherapy and radiation.   Receiving medicine that makes you sleep (general anesthetic) during surgery.  DIAGNOSIS  Your caregiver may ask for tests to be done if the problems do not improve after a few days. Tests may also be done if symptoms are severe or if the reason for the nausea and vomiting is not clear. Tests may include:   Urine tests.   Blood tests.   Stool tests.   Cultures (to look for evidence of infection).   X-rays or other imaging studies.  Test results can help your caregiver make decisions about treatment or the need for additional tests.  TREATMENT  You need to stay well hydrated. Drink frequently but in small amounts.You may wish to drink water, sports drinks, clear broth, or eat frozen  ice pops or gelatin dessert to help stay hydrated.When you eat, eating slowly may help prevent nausea.There are also some antinausea medicines that may help prevent nausea.  HOME CARE INSTRUCTIONS    Take all medicine as directed by your caregiver.   If you do not have an appetite, do not force yourself to eat. However, you must continue to drink fluids.   If you have an appetite, eat a normal diet unless your caregiver tells you differently.   Eat a variety of complex carbohydrates (rice, wheat, potatoes, bread), lean meats, yogurt, fruits, and vegetables.   Avoid high-fat foods because they are more difficult to digest.   Drink enough water and fluids to keep your urine clear or pale yellow.   If you are dehydrated, ask your caregiver for specific rehydration instructions. Signs of dehydration may include:   Severe thirst.   Dry lips and mouth.   Dizziness.   Dark urine.   Decreasing urine frequency and amount.   Confusion.   Rapid breathing or pulse.  SEEK IMMEDIATE MEDICAL CARE IF:    You have blood or brown flecks (like coffee grounds) in your vomit.   You have black or bloody stools.   You have a severe headache or stiff neck.   You are confused.   You have severe abdominal pain.   You have chest pain or trouble breathing.   You do not urinate at least once every 8 hours.   You develop cold or clammy skin.   You continue to vomit for longer than 24 to 48 hours.   You have a fever.  MAKE SURE YOU:      Understand these instructions.   Will watch your condition.   Will get help right away if you are not doing well or get worse.  Document Released: 03/21/2005 Document Revised: 03/10/2011 Document Reviewed: 08/18/2010  [employer] Patient Information 2012 [employer].

## 2012-11-10 DIAGNOSIS — R079 Chest pain, unspecified: Secondary | ICD-10-CM

## 2013-05-09 ENCOUNTER — Other Ambulatory Visit (HOSPITAL_COMMUNITY): Payer: Self-pay

## 2013-05-09 DIAGNOSIS — G4730 Sleep apnea, unspecified: Secondary | ICD-10-CM

## 2013-05-15 ENCOUNTER — Ambulatory Visit: Payer: Medicaid Other | Attending: Neurology | Admitting: Sleep Medicine

## 2013-05-15 VITALS — Ht 71.0 in | Wt 245.0 lb

## 2013-05-15 DIAGNOSIS — R0609 Other forms of dyspnea: Secondary | ICD-10-CM | POA: Insufficient documentation

## 2013-05-15 DIAGNOSIS — G4710 Hypersomnia, unspecified: Secondary | ICD-10-CM | POA: Insufficient documentation

## 2013-05-15 DIAGNOSIS — E669 Obesity, unspecified: Secondary | ICD-10-CM | POA: Insufficient documentation

## 2013-05-15 DIAGNOSIS — R0989 Other specified symptoms and signs involving the circulatory and respiratory systems: Secondary | ICD-10-CM | POA: Insufficient documentation

## 2013-05-15 DIAGNOSIS — G4730 Sleep apnea, unspecified: Secondary | ICD-10-CM

## 2013-05-21 NOTE — Sleep Study (Signed)
  [place] A. Merlene Laughter, MD     [email or web address]          LOCATION: SLEEP LAB FACILITY: [facility]  PHYSICIAN: Clyde Upshaw A. Merlene Laughter, M.D.   DATE OF STUDY: 05/15/2013  NOCTURNAL POLYSOMNOGRAM   REFERRING PHYSICIAN: Willodean Leven.  INDICATIONS: This is a 58 year old who presents with hypersomnia, obesity and snoring.  MEDICATIONS:  Prior to Admission medications   Medication Sig Start Date End Date Taking? Authorizing Provider  Multiple Vitamin (MULITIVITAMIN WITH MINERALS) TABS Take 1 tablet by mouth daily.    Historical Provider, MD  naproxen sodium (ALEVE) 220 MG tablet Take 220 mg by mouth 2 (two) times daily as needed. Pain    Historical Provider, MD      EPWORTH SLEEPINESS SCALE: 13.   BMI: 38.   ARCHITECTURAL SUMMARY: Total recording time was 403 minutes. Sleep efficiency 95 %. Sleep latency 1.5 minutes. REM latency 57 minutes. Stage NI 6 %, N2 59 % and N3 12 % and REM sleep 23 %.    RESPIRATORY DATA:  Baseline oxygen saturation is 96 %. The lowest saturation is 87 %. The diagnostic AHI is 3. The RDI is 3. The REM AHI is 4.  LIMB MOVEMENT SUMMARY: PLM index 4.   ELECTROCARDIOGRAM SUMMARY: Average heart rate is 65 with no significant  dysrhythmias observed.   IMPRESSION:  1. Unremarkable nocturnal postoperatively.  Thanks for this referral.  Itali Mckendry A. Merlene Laughter, M.D. Diplomat, Tax adviser of Sleep Medicine.

## 2013-08-02 HISTORY — PX: VASECTOMY: SHX75

## 2013-12-17 ENCOUNTER — Encounter: Payer: Self-pay | Admitting: Internal Medicine

## 2014-01-13 ENCOUNTER — Other Ambulatory Visit (HOSPITAL_COMMUNITY): Payer: Self-pay | Admitting: Family Medicine

## 2014-01-13 DIAGNOSIS — M545 Low back pain, unspecified: Secondary | ICD-10-CM

## 2014-01-13 DIAGNOSIS — M79605 Pain in left leg: Principal | ICD-10-CM

## 2014-01-16 ENCOUNTER — Ambulatory Visit (HOSPITAL_COMMUNITY)
Admission: RE | Admit: 2014-01-16 | Discharge: 2014-01-16 | Disposition: A | Discharge status: Home / Self Care | Payer: Medicaid Other | Source: Ambulatory Visit | Attending: Family Medicine | Admitting: Family Medicine

## 2014-01-16 DIAGNOSIS — M545 Low back pain, unspecified: Secondary | ICD-10-CM

## 2014-01-16 DIAGNOSIS — M79605 Pain in left leg: Secondary | ICD-10-CM

## 2014-01-16 DIAGNOSIS — M5126 Other intervertebral disc displacement, lumbar region: Secondary | ICD-10-CM | POA: Insufficient documentation

## 2014-01-16 DIAGNOSIS — M5136 Other intervertebral disc degeneration, lumbar region: Secondary | ICD-10-CM | POA: Diagnosis not present

## 2014-06-23 ENCOUNTER — Emergency Department (HOSPITAL_COMMUNITY)
Admission: EM | Admit: 2014-06-23 | Discharge: 2014-06-23 | Discharge status: Against Medical Advice | Payer: Medicare Other | Attending: Emergency Medicine | Admitting: Emergency Medicine

## 2014-06-23 ENCOUNTER — Encounter (HOSPITAL_COMMUNITY): Payer: Self-pay | Admitting: Emergency Medicine

## 2014-06-23 DIAGNOSIS — J449 Chronic obstructive pulmonary disease, unspecified: Secondary | ICD-10-CM | POA: Diagnosis not present

## 2014-06-23 DIAGNOSIS — I10 Essential (primary) hypertension: Secondary | ICD-10-CM | POA: Insufficient documentation

## 2014-06-23 DIAGNOSIS — Z720 Tobacco use: Secondary | ICD-10-CM | POA: Diagnosis not present

## 2014-06-23 DIAGNOSIS — R509 Fever, unspecified: Secondary | ICD-10-CM | POA: Insufficient documentation

## 2014-06-23 HISTORY — DX: Chronic obstructive pulmonary disease, unspecified: J44.9

## 2014-06-23 HISTORY — DX: Essential (primary) hypertension: I10

## 2014-06-23 NOTE — ED Notes (Signed)
Pt told registration he was leaving that he could not wait

## 2014-06-23 NOTE — ED Notes (Signed)
Pt reports fever,n/v/d since Friday. Pt reports seen for same at [facility] and diagnosed with a virus. Pt denies any improvement.

## 2014-10-07 ENCOUNTER — Other Ambulatory Visit (HOSPITAL_COMMUNITY): Payer: Medicare Other

## 2014-10-15 ENCOUNTER — Ambulatory Visit: Payer: Self-pay | Admitting: Physician Assistant

## 2014-10-15 NOTE — H&P (Signed)
TOTAL HIP ADMISSION H&P  Patient is admitted for right total hip arthroplasty.  Subjective:  Chief Complaint: right hip pain  HPI: Brandon Ferrell, 59 y.o. male, has a history of pain and functional disability in the right hip(s) due to arthritis and patient has failed non-surgical conservative treatments for greater than 12 weeks to include NSAID's and/or analgesics, use of assistive devices and activity modification.  Onset of symptoms was gradual starting 8 years ago with gradually worsening course since that time.The patient noted no past surgery on the right hip(s).  Patient currently rates pain in the right hip at 10 out of 10 with activity. Patient has night pain, worsening of pain with activity and weight bearing, trendelenberg gait, pain that interfers with activities of daily living, pain with passive range of motion, crepitus and joint swelling. Patient has evidence of periarticular osteophytes and joint space narrowing by imaging studies. This condition presents safety issues increasing the risk of falls.  There is no current active infection.  Patient Active Problem List   Diagnosis Date Noted  . HYPERLIPIDEMIA 06/21/2006  . TOBACCO ABUSE 06/21/2006  . PRESBYOPIA 06/21/2006  . HYPERTENSION 06/21/2006  . ABSCESS, GROIN 06/21/2006  . INSOMNIA 06/21/2006   Past Medical History  Diagnosis Date  . Palpitations   . COPD (chronic obstructive pulmonary disease)   . Hypertension     No past surgical history on file.   (Not in a hospital admission) Allergies  Allergen Reactions  . Bee Venom Anaphylaxis  . Shellfish Allergy Anaphylaxis and Hives    Lobester, Spider Bites    History  Substance Use Topics  . Smoking status: Current Every Day Smoker -- 1.00 packs/day  . Smokeless tobacco: Not on file  . Alcohol Use: No    No family history on file.   Review of Systems  Constitutional: Negative.   HENT: Negative.   Eyes: Negative.   Respiratory: Positive for shortness of  breath and wheezing. Negative for cough, hemoptysis and sputum production.   Cardiovascular: Negative.   Gastrointestinal: Negative.   Genitourinary: Negative.   Musculoskeletal: Positive for joint pain. Negative for falls.  Skin: Negative.   Neurological: Negative.   Endo/Heme/Allergies: Negative.   Psychiatric/Behavioral: Negative.     Objective:  Physical Exam  Constitutional: He is oriented to person, place, and time. He appears well-developed and well-nourished. No distress.  HENT:  Head: Normocephalic and atraumatic.  Nose: Nose normal.  Eyes: Conjunctivae and EOM are normal. Pupils are equal, round, and reactive to light.  Neck: Normal range of motion. Neck supple.  Cardiovascular: Normal rate, regular rhythm, normal heart sounds and intact distal pulses.   Respiratory: Effort normal and breath sounds normal. No respiratory distress. He has no wheezes.  GI: Soft. Bowel sounds are normal. He exhibits no distension. There is no tenderness.  Musculoskeletal:       Right hip: He exhibits decreased range of motion, tenderness, bony tenderness and swelling.  Lymphadenopathy:    He has no cervical adenopathy.  Neurological: He is alert and oriented to person, place, and time. No cranial nerve deficit.  Skin: Skin is warm and dry. No rash noted. No erythema.  Psychiatric: He has a normal mood and affect. His behavior is normal.    Vital signs in last 24 hours: @VSRANGES @  Labs:   Estimated body mass index is 30.70 kg/(m^2) as calculated from the following:   Height as of 06/23/14: 5\' 11"  (1.803 m).   Weight as of 06/23/14: 99.791 kg (220  lb).   Imaging Review Plain radiographs demonstrate severe degenerative joint disease of the right hip(s). The bone quality appears to be good for age and reported activity level.  Assessment/Plan:  End stage arthritis, right hip(s)  The patient history, physical examination, clinical judgement of the provider and imaging studies are  consistent with end stage degenerative joint disease of the right hip(s) and total hip arthroplasty is deemed medically necessary. The treatment options including medical management, injection therapy, arthroscopy and arthroplasty were discussed at length. The risks and benefits of total hip arthroplasty were presented and reviewed. The risks due to aseptic loosening, infection, stiffness, dislocation/subluxation,  thromboembolic complications and other imponderables were discussed.  The patient acknowledged the explanation, agreed to proceed with the plan and consent was signed. Patient is being admitted for inpatient treatment for surgery, pain control, PT, OT, prophylactic antibiotics, VTE prophylaxis, progressive ambulation and ADL's and discharge planning.The patient is planning to be discharged home with home health services

## 2014-10-27 ENCOUNTER — Encounter (HOSPITAL_COMMUNITY)
Admission: RE | Admit: 2014-10-27 | Discharge: 2014-10-27 | Disposition: A | Discharge status: Home / Self Care | Payer: Medicare Other | Source: Ambulatory Visit | Attending: Orthopedic Surgery | Admitting: Orthopedic Surgery

## 2014-10-27 ENCOUNTER — Ambulatory Visit (HOSPITAL_COMMUNITY)
Admission: RE | Admit: 2014-10-27 | Discharge: 2014-10-27 | Disposition: A | Discharge status: Home / Self Care | Payer: Medicare Other | Source: Ambulatory Visit | Attending: Physician Assistant | Admitting: Physician Assistant

## 2014-10-27 ENCOUNTER — Encounter (HOSPITAL_COMMUNITY): Payer: Self-pay

## 2014-10-27 DIAGNOSIS — Z01812 Encounter for preprocedural laboratory examination: Secondary | ICD-10-CM | POA: Diagnosis not present

## 2014-10-27 DIAGNOSIS — M1611 Unilateral primary osteoarthritis, right hip: Secondary | ICD-10-CM | POA: Insufficient documentation

## 2014-10-27 DIAGNOSIS — E785 Hyperlipidemia, unspecified: Secondary | ICD-10-CM | POA: Diagnosis not present

## 2014-10-27 DIAGNOSIS — F17200 Nicotine dependence, unspecified, uncomplicated: Secondary | ICD-10-CM | POA: Insufficient documentation

## 2014-10-27 DIAGNOSIS — I10 Essential (primary) hypertension: Secondary | ICD-10-CM | POA: Insufficient documentation

## 2014-10-27 DIAGNOSIS — Z01818 Encounter for other preprocedural examination: Secondary | ICD-10-CM | POA: Insufficient documentation

## 2014-10-27 DIAGNOSIS — Z01810 Encounter for preprocedural cardiovascular examination: Secondary | ICD-10-CM | POA: Insufficient documentation

## 2014-10-27 HISTORY — DX: Unspecified osteoarthritis, unspecified site: M19.90

## 2014-10-27 HISTORY — DX: Gastro-esophageal reflux disease without esophagitis: K21.9

## 2014-10-27 HISTORY — DX: Pneumonia, unspecified organism: J18.9

## 2014-10-27 LAB — URINE MICROSCOPIC-ADD ON

## 2014-10-27 LAB — CBC WITH DIFFERENTIAL/PLATELET
BASOS ABS: 0 10*3/uL (ref 0.0–0.1)
Basophils Relative: 1 % (ref 0–1)
EOS ABS: 0.8 10*3/uL — AB (ref 0.0–0.7)
Eosinophils Relative: 11 % — ABNORMAL HIGH (ref 0–5)
HCT: 41.1 % (ref 39.0–52.0)
HEMOGLOBIN: 14.4 g/dL (ref 13.0–17.0)
LYMPHS ABS: 3.5 10*3/uL (ref 0.7–4.0)
LYMPHS PCT: 44 % (ref 12–46)
MCH: 30.8 pg (ref 26.0–34.0)
MCHC: 35 g/dL (ref 30.0–36.0)
MCV: 88 fL (ref 78.0–100.0)
Monocytes Absolute: 0.5 10*3/uL (ref 0.1–1.0)
Monocytes Relative: 6 % (ref 3–12)
Neutro Abs: 2.9 10*3/uL (ref 1.7–7.7)
Neutrophils Relative %: 38 % — ABNORMAL LOW (ref 43–77)
Platelets: 196 10*3/uL (ref 150–400)
RBC: 4.67 MIL/uL (ref 4.22–5.81)
RDW: 13.5 % (ref 11.5–15.5)
WBC: 7.7 10*3/uL (ref 4.0–10.5)

## 2014-10-27 LAB — SURGICAL PCR SCREEN
MRSA, PCR: NEGATIVE
STAPHYLOCOCCUS AUREUS: NEGATIVE

## 2014-10-27 LAB — COMPREHENSIVE METABOLIC PANEL
ALT: 19 U/L (ref 17–63)
ANION GAP: 5 (ref 5–15)
AST: 48 U/L — ABNORMAL HIGH (ref 15–41)
Albumin: 3.6 g/dL (ref 3.5–5.0)
Alkaline Phosphatase: 66 U/L (ref 38–126)
BUN: 16 mg/dL (ref 6–20)
CALCIUM: 8.9 mg/dL (ref 8.9–10.3)
CO2: 26 mmol/L (ref 22–32)
Chloride: 106 mmol/L (ref 101–111)
Creatinine, Ser: 1.14 mg/dL (ref 0.61–1.24)
GFR calc Af Amer: >60 mL/min (ref >60)
GLUCOSE: 78 mg/dL (ref 65–99)
Potassium: 5.5 mmol/L — ABNORMAL HIGH (ref 3.5–5.1)
Sodium: 137 mmol/L (ref 135–145)
Total Bilirubin: 1.7 mg/dL — ABNORMAL HIGH (ref 0.3–1.2)
Total Protein: 6.1 g/dL — ABNORMAL LOW (ref 6.5–8.1)

## 2014-10-27 LAB — TYPE AND SCREEN
ABO/RH(D): A POS
ANTIBODY SCREEN: NEGATIVE

## 2014-10-27 LAB — URINALYSIS, ROUTINE W REFLEX MICROSCOPIC
BILIRUBIN URINE: NEGATIVE
Glucose, UA: NEGATIVE mg/dL
Ketones, ur: NEGATIVE mg/dL
Nitrite: NEGATIVE
PROTEIN: NEGATIVE mg/dL
SPECIFIC GRAVITY, URINE: 1.027 (ref 1.005–1.030)
Urobilinogen, UA: 0.2 mg/dL (ref 0.0–1.0)
pH: 5.5 (ref 5.0–8.0)

## 2014-10-27 LAB — ABO/RH: ABO/RH(D): A POS

## 2014-10-27 LAB — APTT: aPTT: 27 seconds (ref 24–37)

## 2014-10-27 LAB — PROTIME-INR
INR: 1.05 (ref 0.00–1.49)
Prothrombin Time: 13.9 seconds (ref 11.6–15.2)

## 2014-10-27 NOTE — Pre-Procedure Instructions (Addendum)
Brandon Ferrell  10/27/2014      [facility] - [place], [place] - [address] [place] [address] [place] [address] Phone: [phone] Fax: [phone]    Your procedure is scheduled on *11/07/14  Report to [facility] short stay admitting at 530 A.M.  Call this number if you have problems the morning of surgery:  [phone]   Remember:  Do not eat food or drink liquids after midnight.  Take these medicines the morning of surgery with A SIP OF WATER inhalers if needed,amlodipine,oxycodone ,zantac    STOP all herbel meds, nsaids (aleve,naproxen,advil,ibuprofen) 5 days prior to surgery starting 11/03/14 including vitamins,aspirin   Do not wear jewelry, make-up or nail polish.  Do not wear lotions, powders, or perfumes.  You may wear deodorant.  Do not shave 48 hours prior to surgery.  Men may shave face and neck.  Do not bring valuables to the hospital.  [facility] is not responsible for any belongings or valuables.  Contacts, dentures or bridgework may not be worn into surgery.  Leave your suitcase in the car.  After surgery it may be brought to your room.  For patients admitted to the hospital, discharge time will be determined by your treatment team.  Patients discharged the day of surgery will not be allowed to drive home.   Name and phone number of your driver:    Special instructions:   Special Instructions: [place] - Preparing for Surgery  Before surgery, you can play an important role.  Because skin is not sterile, your skin needs to be as free of germs as possible.  You can reduce the number of germs on you skin by washing with CHG (chlorahexidine gluconate) soap before surgery.  CHG is an antiseptic cleaner which kills germs and bonds with the skin to continue killing germs even after washing.  Please DO NOT use if you have an allergy to CHG or antibacterial soaps.  If your skin becomes reddened/irritated stop using the CHG and inform your  nurse when you arrive at Short Stay.  Do not shave (including legs and underarms) for at least 48 hours prior to the first CHG shower.  You may shave your face.  Please follow these instructions carefully:   1.  Shower with CHG Soap the night before surgery and the morning of Surgery.  2.  If you choose to wash your hair, wash your hair first as usual with your normal shampoo.  3.  After you shampoo, rinse your hair and body thoroughly to remove the Shampoo.  4.  Use CHG as you would any other liquid soap.  You can apply chg directly  to the skin and wash gently with scrungie or a clean washcloth.  5.  Apply the CHG Soap to your body ONLY FROM THE NECK DOWN.  Do not use on open wounds or open sores.  Avoid contact with your eyes ears, mouth and genitals (private parts).  Wash genitals (private parts)       with your normal soap.  6.  Wash thoroughly, paying special attention to the area where your surgery will be performed.  7.  Thoroughly rinse your body with warm water from the neck down.  8.  DO NOT shower/wash with your normal soap after using and rinsing off the CHG Soap.  9.  Pat yourself dry with a clean towel.            10.  Wear clean  pajamas.            11.  Place clean sheets on your bed the night of your first shower and do not sleep with pets.  Day of Surgery  Do not apply any lotions/deodorants the morning of surgery.  Please wear clean clothes to the hospital/surgery center.  Please read over the following fact sheets that you were given. Pain Booklet, Coughing and Deep Breathing, Blood Transfusion Information, MRSA Information and Surgical Site Infection Prevention

## 2014-10-27 NOTE — Progress Notes (Signed)
   10/27/14 1041  OBSTRUCTIVE SLEEP APNEA  Do you snore loudly (loud enough to be heard through closed doors)?  1  Do you often feel tired, fatigued, or sleepy during the daytime? 0  Has anyone observed you stop breathing during your sleep? 1  Do you have, or are you being treated for high blood pressure? 1  BMI more than 35 kg/m2? 0  Age over 59 years old? 1  Neck circumference greater than 40 cm/16 inches? 0  Gender: 1  Obstructive Sleep Apnea Score 4

## 2014-10-28 LAB — URINE CULTURE: CULTURE: NO GROWTH

## 2014-10-28 NOTE — Progress Notes (Signed)
Anesthesia Chart Review:  Pt is 59 year old male scheduled for R total hip arthroplasty on 11/07/2014 with Dr. French Ana.   PMH includes: HTN, palpitations, COPD, GERD. Current smoker. BMI 31.  Medications include: albuteriol, amlodipine, symbicort, lisinopril-hctz  Preoperative labs reviewed.  K 5.5, specimen hemolyzed.  Will get istat 4 DOS to recheck K.   Chest x-ray 10/27/2014 reviewed. No active cardiopulmonary disease.   EKG 10/27/2014: sinus bradycardia (52 bpm).   Echo 02/07/2006: - Overall left ventricular systolic function was normal. Left ventricular ejection fraction was estimated , range being 55% to 65 %. Although no diagnostic left ventricular regional wall motion abnormality was identified, this possibility cannot be completely excluded on the basis of this study. Left ventricular wall thickness was moderately to markedly increased. Left ventricular diastolic function parameters were normal. - The aortic root was at the upper limits of normal in size. - Left atrial size was at the upper limits of normal.  If labs acceptable DOS, I anticipate pt can proceed as scheduled.   Willeen Cass, FNP-BC [facility] Short Stay Surgical Center/Anesthesiology Phone: [phone] 10/28/2014 4:06 PM

## 2014-11-06 MED ORDER — TRANEXAMIC ACID 1000 MG/10ML IV SOLN
1000.0000 mg | INTRAVENOUS | Status: AC
  Administered 2014-11-07: 1000 mg via INTRAVENOUS
  Filled 2014-11-06 (×2): qty 10

## 2014-11-07 ENCOUNTER — Inpatient Hospital Stay (HOSPITAL_COMMUNITY): Payer: Medicare Other | Admitting: Emergency Medicine

## 2014-11-07 ENCOUNTER — Inpatient Hospital Stay (HOSPITAL_COMMUNITY): Payer: Medicare Other | Admitting: Anesthesiology

## 2014-11-07 ENCOUNTER — Encounter (HOSPITAL_COMMUNITY)
Admission: RE | Disposition: A | Discharge status: Home Health | Payer: Self-pay | Source: Ambulatory Visit | Attending: Orthopedic Surgery

## 2014-11-07 ENCOUNTER — Encounter (HOSPITAL_COMMUNITY): Payer: Self-pay | Admitting: *Deleted

## 2014-11-07 ENCOUNTER — Inpatient Hospital Stay (HOSPITAL_COMMUNITY)
Admission: RE | Admit: 2014-11-07 | Discharge: 2014-11-10 | DRG: 470 | Disposition: A | Discharge status: Home Health | Payer: Medicare Other | Source: Ambulatory Visit | Attending: Orthopedic Surgery | Admitting: Orthopedic Surgery

## 2014-11-07 ENCOUNTER — Inpatient Hospital Stay (HOSPITAL_COMMUNITY): Payer: Medicare Other

## 2014-11-07 DIAGNOSIS — F17210 Nicotine dependence, cigarettes, uncomplicated: Secondary | ICD-10-CM | POA: Diagnosis present

## 2014-11-07 DIAGNOSIS — Z6830 Body mass index (BMI) 30.0-30.9, adult: Secondary | ICD-10-CM

## 2014-11-07 DIAGNOSIS — M25551 Pain in right hip: Secondary | ICD-10-CM | POA: Diagnosis present

## 2014-11-07 DIAGNOSIS — Z79899 Other long term (current) drug therapy: Secondary | ICD-10-CM | POA: Diagnosis not present

## 2014-11-07 DIAGNOSIS — N9989 Other postprocedural complications and disorders of genitourinary system: Secondary | ICD-10-CM | POA: Diagnosis not present

## 2014-11-07 DIAGNOSIS — R338 Other retention of urine: Secondary | ICD-10-CM | POA: Diagnosis not present

## 2014-11-07 DIAGNOSIS — K5900 Constipation, unspecified: Secondary | ICD-10-CM | POA: Diagnosis not present

## 2014-11-07 DIAGNOSIS — E669 Obesity, unspecified: Secondary | ICD-10-CM | POA: Diagnosis not present

## 2014-11-07 DIAGNOSIS — I10 Essential (primary) hypertension: Secondary | ICD-10-CM | POA: Diagnosis present

## 2014-11-07 DIAGNOSIS — Z96641 Presence of right artificial hip joint: Secondary | ICD-10-CM

## 2014-11-07 DIAGNOSIS — K219 Gastro-esophageal reflux disease without esophagitis: Secondary | ICD-10-CM | POA: Diagnosis present

## 2014-11-07 DIAGNOSIS — M1611 Unilateral primary osteoarthritis, right hip: Secondary | ICD-10-CM | POA: Diagnosis not present

## 2014-11-07 DIAGNOSIS — J449 Chronic obstructive pulmonary disease, unspecified: Secondary | ICD-10-CM | POA: Diagnosis present

## 2014-11-07 HISTORY — PX: TOTAL HIP ARTHROPLASTY: SHX124

## 2014-11-07 LAB — POCT I-STAT 4, (NA,K, GLUC, HGB,HCT)
GLUCOSE: 90 mg/dL (ref 65–99)
HEMATOCRIT: 43 % (ref 39.0–52.0)
Hemoglobin: 14.6 g/dL (ref 13.0–17.0)
Potassium: 4.3 mmol/L (ref 3.5–5.1)
Sodium: 138 mmol/L (ref 135–145)

## 2014-11-07 SURGERY — ARTHROPLASTY, HIP, TOTAL,POSTERIOR APPROACH
Anesthesia: General | Site: Hip | Laterality: Right

## 2014-11-07 MED ORDER — PHENYLEPHRINE 40 MCG/ML (10ML) SYRINGE FOR IV PUSH (FOR BLOOD PRESSURE SUPPORT)
PREFILLED_SYRINGE | INTRAVENOUS | Status: AC
  Filled 2014-11-07: qty 10

## 2014-11-07 MED ORDER — LIDOCAINE HCL (CARDIAC) 20 MG/ML IV SOLN
INTRAVENOUS | Status: AC
  Filled 2014-11-07: qty 5

## 2014-11-07 MED ORDER — GLYCOPYRROLATE 0.2 MG/ML IJ SOLN
INTRAMUSCULAR | Status: AC
  Filled 2014-11-07: qty 2

## 2014-11-07 MED ORDER — LISINOPRIL-HYDROCHLOROTHIAZIDE 20-12.5 MG PO TABS: 1.0000 | ORAL_TABLET | Freq: Every day | ORAL | Status: DC

## 2014-11-07 MED ORDER — OXYCODONE HCL 5 MG PO TABS
5.0000 mg | ORAL_TABLET | ORAL | Status: DC | PRN
  Administered 2014-11-07 – 2014-11-10 (×21): 10 mg via ORAL
  Filled 2014-11-07 (×20): qty 2

## 2014-11-07 MED ORDER — ONDANSETRON HCL 4 MG PO TABS: 4.0000 mg | ORAL_TABLET | Freq: Four times a day (QID) | ORAL | Status: DC | PRN

## 2014-11-07 MED ORDER — SODIUM CHLORIDE 0.9 % IR SOLN
Status: DC | PRN
  Administered 2014-11-07: 1000 mL

## 2014-11-07 MED ORDER — ALUM & MAG HYDROXIDE-SIMETH 200-200-20 MG/5ML PO SUSP: 30.0000 mL | ORAL | Status: DC | PRN

## 2014-11-07 MED ORDER — EPHEDRINE SULFATE 50 MG/ML IJ SOLN
INTRAMUSCULAR | Status: DC | PRN
  Administered 2014-11-07: 20 mg via INTRAVENOUS

## 2014-11-07 MED ORDER — AMLODIPINE BESYLATE 5 MG PO TABS
5.0000 mg | ORAL_TABLET | Freq: Every day | ORAL | Status: DC
  Administered 2014-11-07 – 2014-11-10 (×4): 5 mg via ORAL
  Filled 2014-11-07 (×4): qty 1

## 2014-11-07 MED ORDER — MEPERIDINE HCL 25 MG/ML IJ SOLN: 6.2500 mg | INTRAMUSCULAR | Status: DC | PRN

## 2014-11-07 MED ORDER — MENTHOL 3 MG MT LOZG: 1.0000 | LOZENGE | OROMUCOSAL | Status: DC | PRN

## 2014-11-07 MED ORDER — BUDESONIDE-FORMOTEROL FUMARATE 160-4.5 MCG/ACT IN AERO
2.0000 | INHALATION_SPRAY | Freq: Two times a day (BID) | RESPIRATORY_TRACT | Status: DC
  Administered 2014-11-08 – 2014-11-10 (×5): 2 via RESPIRATORY_TRACT
  Filled 2014-11-07 (×3): qty 6

## 2014-11-07 MED ORDER — LIDOCAINE HCL (CARDIAC) 20 MG/ML IV SOLN
INTRAVENOUS | Status: DC | PRN
  Administered 2014-11-07: 100 mg via INTRAVENOUS

## 2014-11-07 MED ORDER — FENTANYL CITRATE (PF) 250 MCG/5ML IJ SOLN
INTRAMUSCULAR | Status: AC
  Filled 2014-11-07: qty 5

## 2014-11-07 MED ORDER — HYDROMORPHONE HCL 1 MG/ML IJ SOLN
1.0000 mg | INTRAMUSCULAR | Status: DC | PRN
  Administered 2014-11-07 (×4): 1 mg via INTRAVENOUS
  Filled 2014-11-07 (×3): qty 1

## 2014-11-07 MED ORDER — ALBUTEROL SULFATE (2.5 MG/3ML) 0.083% IN NEBU: 3.0000 mL | INHALATION_SOLUTION | Freq: Four times a day (QID) | RESPIRATORY_TRACT | Status: DC | PRN

## 2014-11-07 MED ORDER — PROPOFOL 10 MG/ML IV BOLUS
INTRAVENOUS | Status: AC
  Filled 2014-11-07: qty 20

## 2014-11-07 MED ORDER — ROCURONIUM BROMIDE 100 MG/10ML IV SOLN
INTRAVENOUS | Status: DC | PRN
  Administered 2014-11-07: 50 mg via INTRAVENOUS
  Administered 2014-11-07: 20 mg via INTRAVENOUS

## 2014-11-07 MED ORDER — EPHEDRINE SULFATE 50 MG/ML IJ SOLN
INTRAMUSCULAR | Status: AC
  Filled 2014-11-07: qty 1

## 2014-11-07 MED ORDER — NEOSTIGMINE METHYLSULFATE 10 MG/10ML IV SOLN
INTRAVENOUS | Status: AC
  Filled 2014-11-07: qty 1

## 2014-11-07 MED ORDER — OXYCODONE HCL 5 MG PO TABS
ORAL_TABLET | ORAL | Status: AC
  Filled 2014-11-07: qty 2

## 2014-11-07 MED ORDER — HYDROMORPHONE HCL 1 MG/ML IJ SOLN
0.2500 mg | INTRAMUSCULAR | Status: DC | PRN
  Administered 2014-11-07 (×2): 1 mg via INTRAVENOUS

## 2014-11-07 MED ORDER — LISINOPRIL 20 MG PO TABS
20.0000 mg | ORAL_TABLET | Freq: Every day | ORAL | Status: DC
  Administered 2014-11-07 – 2014-11-10 (×4): 20 mg via ORAL
  Filled 2014-11-07 (×4): qty 1

## 2014-11-07 MED ORDER — ONDANSETRON HCL 4 MG/2ML IJ SOLN: 4.0000 mg | Freq: Four times a day (QID) | INTRAMUSCULAR | Status: DC | PRN

## 2014-11-07 MED ORDER — GLYCOPYRROLATE 0.2 MG/ML IJ SOLN
INTRAMUSCULAR | Status: DC | PRN
  Administered 2014-11-07: 0.4 mg via INTRAVENOUS

## 2014-11-07 MED ORDER — FLEET ENEMA 7-19 GM/118ML RE ENEM: 1.0000 | ENEMA | Freq: Once | RECTAL | Status: AC | PRN

## 2014-11-07 MED ORDER — OXYCODONE-ACETAMINOPHEN 10-325 MG PO TABS: 1.0000 | ORAL_TABLET | ORAL | Status: DC | PRN

## 2014-11-07 MED ORDER — PROMETHAZINE HCL 25 MG/ML IJ SOLN
6.2500 mg | INTRAMUSCULAR | Status: DC | PRN
  Administered 2014-11-07: 6.25 mg via INTRAVENOUS

## 2014-11-07 MED ORDER — NEOSTIGMINE METHYLSULFATE 10 MG/10ML IV SOLN
INTRAVENOUS | Status: DC | PRN
  Administered 2014-11-07: 3 mg via INTRAVENOUS

## 2014-11-07 MED ORDER — SODIUM CHLORIDE 0.9 % IV SOLN: INTRAVENOUS | Status: DC

## 2014-11-07 MED ORDER — CHLORHEXIDINE GLUCONATE 4 % EX LIQD: 60.0000 mL | Freq: Once | CUTANEOUS | Status: DC

## 2014-11-07 MED ORDER — METOCLOPRAMIDE HCL 5 MG/ML IJ SOLN: 5.0000 mg | Freq: Three times a day (TID) | INTRAMUSCULAR | Status: DC | PRN

## 2014-11-07 MED ORDER — PROMETHAZINE HCL 25 MG/ML IJ SOLN
INTRAMUSCULAR | Status: AC
  Filled 2014-11-07: qty 1

## 2014-11-07 MED ORDER — FENTANYL CITRATE (PF) 100 MCG/2ML IJ SOLN
INTRAMUSCULAR | Status: DC | PRN
  Administered 2014-11-07: 50 ug via INTRAVENOUS
  Administered 2014-11-07: 100 ug via INTRAVENOUS
  Administered 2014-11-07: 50 ug via INTRAVENOUS
  Administered 2014-11-07 (×2): 100 ug via INTRAVENOUS

## 2014-11-07 MED ORDER — SODIUM CHLORIDE 0.9 % IV SOLN
INTRAVENOUS | Status: DC
  Administered 2014-11-07: 20:00:00 via INTRAVENOUS

## 2014-11-07 MED ORDER — ACETAMINOPHEN 325 MG PO TABS
650.0000 mg | ORAL_TABLET | Freq: Four times a day (QID) | ORAL | Status: DC | PRN
  Administered 2014-11-08 – 2014-11-10 (×4): 650 mg via ORAL
  Filled 2014-11-07 (×4): qty 2

## 2014-11-07 MED ORDER — ROCURONIUM BROMIDE 50 MG/5ML IV SOLN
INTRAVENOUS | Status: AC
  Filled 2014-11-07: qty 1

## 2014-11-07 MED ORDER — APIXABAN 2.5 MG PO TABS
2.5000 mg | ORAL_TABLET | Freq: Two times a day (BID) | ORAL | Status: DC
Start: 2014-11-08 — End: 2014-11-07

## 2014-11-07 MED ORDER — DIPHENHYDRAMINE HCL 25 MG PO TABS
25.0000 mg | ORAL_TABLET | Freq: Every day | ORAL | Status: DC
  Administered 2014-11-07 – 2014-11-09 (×3): 25 mg via ORAL
  Filled 2014-11-07 (×8): qty 1

## 2014-11-07 MED ORDER — LACTATED RINGERS IV SOLN
INTRAVENOUS | Status: DC | PRN
  Administered 2014-11-07: 07:00:00 via INTRAVENOUS

## 2014-11-07 MED ORDER — ONDANSETRON HCL 4 MG/2ML IJ SOLN
INTRAMUSCULAR | Status: DC | PRN
  Administered 2014-11-07: 4 mg via INTRAVENOUS

## 2014-11-07 MED ORDER — BISACODYL 10 MG RE SUPP
10.0000 mg | Freq: Every day | RECTAL | Status: DC | PRN
  Administered 2014-11-08: 10 mg via RECTAL
  Filled 2014-11-07: qty 1

## 2014-11-07 MED ORDER — SENNOSIDES-DOCUSATE SODIUM 8.6-50 MG PO TABS
1.0000 | ORAL_TABLET | Freq: Every evening | ORAL | Status: DC | PRN
  Administered 2014-11-09: 1 via ORAL
  Filled 2014-11-07: qty 1

## 2014-11-07 MED ORDER — DOCUSATE SODIUM 100 MG PO CAPS
100.0000 mg | ORAL_CAPSULE | Freq: Two times a day (BID) | ORAL | Status: DC
  Administered 2014-11-07 – 2014-11-10 (×7): 100 mg via ORAL
  Filled 2014-11-07 (×7): qty 1

## 2014-11-07 MED ORDER — ENOXAPARIN SODIUM 30 MG/0.3ML ~~LOC~~ SOLN: 30.0000 mg | Freq: Two times a day (BID) | SUBCUTANEOUS | Status: DC

## 2014-11-07 MED ORDER — PROPOFOL 10 MG/ML IV BOLUS
INTRAVENOUS | Status: DC | PRN
  Administered 2014-11-07: 200 mg via INTRAVENOUS

## 2014-11-07 MED ORDER — ENOXAPARIN SODIUM 30 MG/0.3ML ~~LOC~~ SOLN
30.0000 mg | Freq: Two times a day (BID) | SUBCUTANEOUS | Status: DC
  Administered 2014-11-08 – 2014-11-10 (×5): 30 mg via SUBCUTANEOUS
  Filled 2014-11-07 (×5): qty 0.3

## 2014-11-07 MED ORDER — ACETAMINOPHEN 650 MG RE SUPP: 650.0000 mg | Freq: Four times a day (QID) | RECTAL | Status: DC | PRN

## 2014-11-07 MED ORDER — MIDAZOLAM HCL 2 MG/2ML IJ SOLN
INTRAMUSCULAR | Status: AC
  Filled 2014-11-07: qty 4

## 2014-11-07 MED ORDER — CEFAZOLIN SODIUM-DEXTROSE 2-3 GM-% IV SOLR
2.0000 g | Freq: Four times a day (QID) | INTRAVENOUS | Status: AC
  Administered 2014-11-07 (×2): 2 g via INTRAVENOUS
  Filled 2014-11-07 (×2): qty 50

## 2014-11-07 MED ORDER — ACETAMINOPHEN 500 MG PO TABS
1000.0000 mg | ORAL_TABLET | Freq: Four times a day (QID) | ORAL | Status: AC
  Administered 2014-11-07 – 2014-11-08 (×4): 1000 mg via ORAL
  Filled 2014-11-07 (×4): qty 2

## 2014-11-07 MED ORDER — PHENYLEPHRINE HCL 10 MG/ML IJ SOLN
INTRAMUSCULAR | Status: DC | PRN
  Administered 2014-11-07 (×5): 80 ug via INTRAVENOUS

## 2014-11-07 MED ORDER — METOCLOPRAMIDE HCL 5 MG PO TABS: 5.0000 mg | ORAL_TABLET | Freq: Three times a day (TID) | ORAL | Status: DC | PRN

## 2014-11-07 MED ORDER — DIPHENHYDRAMINE HCL 25 MG PO CAPS
25.0000 mg | ORAL_CAPSULE | Freq: Four times a day (QID) | ORAL | Status: DC | PRN
  Administered 2014-11-07 – 2014-11-10 (×3): 25 mg via ORAL
  Filled 2014-11-07: qty 1

## 2014-11-07 MED ORDER — DEXTROSE 5 % IV SOLN
500.0000 mg | Freq: Four times a day (QID) | INTRAVENOUS | Status: DC | PRN
  Administered 2014-11-07: 500 mg via INTRAVENOUS
  Filled 2014-11-07 (×2): qty 5

## 2014-11-07 MED ORDER — MIDAZOLAM HCL 5 MG/5ML IJ SOLN
INTRAMUSCULAR | Status: DC | PRN
  Administered 2014-11-07: 2 mg via INTRAVENOUS

## 2014-11-07 MED ORDER — HYDROMORPHONE HCL 1 MG/ML IJ SOLN
INTRAMUSCULAR | Status: AC
  Filled 2014-11-07: qty 2

## 2014-11-07 MED ORDER — METHOCARBAMOL 500 MG PO TABS
500.0000 mg | ORAL_TABLET | Freq: Four times a day (QID) | ORAL | Status: DC | PRN
  Administered 2014-11-07 – 2014-11-10 (×8): 500 mg via ORAL
  Filled 2014-11-07 (×9): qty 1

## 2014-11-07 MED ORDER — SODIUM CHLORIDE 0.9 % IJ SOLN
INTRAMUSCULAR | Status: AC
  Filled 2014-11-07: qty 10

## 2014-11-07 MED ORDER — HYDROCHLOROTHIAZIDE 12.5 MG PO CAPS
12.5000 mg | ORAL_CAPSULE | Freq: Every day | ORAL | Status: DC
  Administered 2014-11-07 – 2014-11-10 (×4): 12.5 mg via ORAL
  Filled 2014-11-07 (×4): qty 1

## 2014-11-07 MED ORDER — CEFAZOLIN SODIUM-DEXTROSE 2-3 GM-% IV SOLR
2.0000 g | INTRAVENOUS | Status: AC
  Administered 2014-11-07: 2 g via INTRAVENOUS

## 2014-11-07 MED ORDER — PHENOL 1.4 % MT LIQD: 1.0000 | OROMUCOSAL | Status: DC | PRN

## 2014-11-07 MED ORDER — ONDANSETRON HCL 4 MG/2ML IJ SOLN
INTRAMUSCULAR | Status: AC
  Filled 2014-11-07: qty 2

## 2014-11-07 MED ORDER — HYDROMORPHONE HCL 1 MG/ML IJ SOLN
INTRAMUSCULAR | Status: AC
  Filled 2014-11-07: qty 1

## 2014-11-07 SURGICAL SUPPLY — 57 items
BLADE SAW SAG 73X25 THK (BLADE) ×2
BLADE SAW SGTL 73X25 THK (BLADE) ×1 IMPLANT
BRUSH FEMORAL CANAL (MISCELLANEOUS) IMPLANT
CAPT HIP TOTAL 2 ×2 IMPLANT
COVER SURGICAL LIGHT HANDLE (MISCELLANEOUS) ×3 IMPLANT
DRAPE IMP U-DRAPE 54X76 (DRAPES) ×3 IMPLANT
DRAPE INCISE IOBAN 66X45 STRL (DRAPES) IMPLANT
DRAPE ORTHO SPLIT 77X108 STRL (DRAPES) ×6
DRAPE SURG ORHT 6 SPLT 77X108 (DRAPES) ×2 IMPLANT
DRAPE U-SHAPE 47X51 STRL (DRAPES) ×3 IMPLANT
DRSG ADAPTIC 3X8 NADH LF (GAUZE/BANDAGES/DRESSINGS) ×3 IMPLANT
DRSG PAD ABDOMINAL 8X10 ST (GAUZE/BANDAGES/DRESSINGS) ×4 IMPLANT
DURAPREP 26ML APPLICATOR (WOUND CARE) ×3 IMPLANT
ELECT BLADE 6.5 EXT (BLADE) IMPLANT
ELECT CAUTERY BLADE 6.4 (BLADE) ×3 IMPLANT
ELECT REM PT RETURN 9FT ADLT (ELECTROSURGICAL) ×3
ELECTRODE REM PT RTRN 9FT ADLT (ELECTROSURGICAL) ×1 IMPLANT
FACESHIELD WRAPAROUND (MASK) ×6 IMPLANT
FACESHIELD WRAPAROUND OR TEAM (MASK) ×2 IMPLANT
GAUZE SPONGE 4X4 12PLY STRL (GAUZE/BANDAGES/DRESSINGS) ×3 IMPLANT
GLOVE BIOGEL PI IND STRL 8 (GLOVE) ×2 IMPLANT
GLOVE BIOGEL PI INDICATOR 8 (GLOVE) ×4
GLOVE ORTHO TXT STRL SZ7.5 (GLOVE) ×6 IMPLANT
GLOVE SURG ORTHO 8.0 STRL STRW (GLOVE) ×6 IMPLANT
GOWN STRL REUS W/ TWL LRG LVL3 (GOWN DISPOSABLE) ×1 IMPLANT
GOWN STRL REUS W/ TWL XL LVL3 (GOWN DISPOSABLE) ×1 IMPLANT
GOWN STRL REUS W/TWL 2XL LVL3 (GOWN DISPOSABLE) ×3 IMPLANT
GOWN STRL REUS W/TWL LRG LVL3 (GOWN DISPOSABLE) ×3
GOWN STRL REUS W/TWL XL LVL3 (GOWN DISPOSABLE) ×3
HANDPIECE INTERPULSE COAX TIP (DISPOSABLE)
HOOD PEEL AWAY FACE SHEILD DIS (HOOD) ×3 IMPLANT
IMMOBILIZER KNEE 22 UNIV (SOFTGOODS) ×3 IMPLANT
KIT BASIN OR (CUSTOM PROCEDURE TRAY) ×3 IMPLANT
KIT ROOM TURNOVER OR (KITS) ×3 IMPLANT
MANIFOLD NEPTUNE II (INSTRUMENTS) ×3 IMPLANT
NDL MAYO TROCAR (NEEDLE) IMPLANT
NEEDLE 22X1 1/2 (OR ONLY) (NEEDLE) ×3 IMPLANT
NEEDLE MAYO TROCAR (NEEDLE) IMPLANT
NS IRRIG 1000ML POUR BTL (IV SOLUTION) ×3 IMPLANT
PACK TOTAL JOINT (CUSTOM PROCEDURE TRAY) ×3 IMPLANT
PACK UNIVERSAL I (CUSTOM PROCEDURE TRAY) ×3 IMPLANT
PAD ARMBOARD 7.5X6 YLW CONV (MISCELLANEOUS) ×6 IMPLANT
PRESSURIZER FEMORAL UNIV (MISCELLANEOUS) IMPLANT
SET HNDPC FAN SPRY TIP SCT (DISPOSABLE) IMPLANT
STAPLER VISISTAT 35W (STAPLE) ×3 IMPLANT
SUCTION FRAZIER TIP 10 FR DISP (SUCTIONS) ×3 IMPLANT
SUT ETHIBOND 2 V 37 (SUTURE) ×6 IMPLANT
SUT VIC AB 0 CT1 27 (SUTURE) ×3
SUT VIC AB 0 CT1 27XBRD ANBCTR (SUTURE) ×1 IMPLANT
SUT VIC AB 2-0 CT1 27 (SUTURE) ×6
SUT VIC AB 2-0 CT1 TAPERPNT 27 (SUTURE) ×2 IMPLANT
SYR CONTROL 10ML LL (SYRINGE) ×3 IMPLANT
TAPE CLOTH SURG 6X10 WHT LF (GAUZE/BANDAGES/DRESSINGS) ×2 IMPLANT
TOWEL OR 17X24 6PK STRL BLUE (TOWEL DISPOSABLE) ×3 IMPLANT
TOWEL OR 17X26 10 PK STRL BLUE (TOWEL DISPOSABLE) ×3 IMPLANT
TOWER CARTRIDGE SMART MIX (DISPOSABLE) IMPLANT
WATER STERILE IRR 1000ML POUR (IV SOLUTION) ×3 IMPLANT

## 2014-11-07 NOTE — Op Note (Signed)
NAME:  KNOXX, BOEDING NO.:  [id number]  MEDICAL RECORD NO.:  [id number]  LOCATION:  5N13C                        FACILITY:  [facility]  PHYSICIAN:  Lockie Pares, M.D.    DATE OF BIRTH:  01-06-1956  DATE OF PROCEDURE:  11/07/2014 DATE OF DISCHARGE:                              OPERATIVE REPORT   PREOPERATIVE DIAGNOSIS:  Severe osteoarthritis, right hip.  POSTOPERATIVE DIAGNOSIS:  Severe osteoarthritis, right hip.  OPERATION:  Right total hip replacement (DePuy AML stem 12 mm small stature +8.5 mm neck length with 36 mm ceramic hip ball with 56 mm Gription acetabulum with 10-degree marathon poly liner, 56 mm cup).  SURGEON:  Lockie Pares, M.D.  ASSISTANT:  Chriss Czar, PA-C.  BLOOD LOSS:  Approximately 300.  DESCRIPTION OF PROCEDURE:  Posterior approach to the hip made with the patient in lateral position, splitting of the iliotibial band, gluteus maximus, fascia.  We did a T capsulotomy of the hip, split the short external rotators, and dislocated the hip.  Severe osteoarthritis was noted, cut the head about 1 fingerbreadth above the lesser trochanter. We then progressively reamed and rasped to accept a 12 mm small statured stem.  Attention was next directed to the acetabulum, acetabular retractors placed anteriorly and inferiorly with Wing retractors superiorly and posteriorly.  There was moderate erosion into the pelvic area, but still good medial wall was present.  We reamed for a 1 mm under ream on the acetabulum, 55 up to 56 mm cup, placed in about 50 degrees of abduction with 10-15 degrees of anteversion.  Final cup was inserted with a trial liner.  We then trialed off the broach, identified, excellent stability was noted with +8.5 mm neck length with very little tendency to dislocate even extreme flexion, internal rotation, adduction.  Trial components were removed with the exception of the cup, which had already been placed.  We then placed the  final cup liner.  Prior to this, we placed 1 acetabular screw into the iliac wing area for extra stability; however, the cup was stable even without the screw, 20 mm screw was used.  The final femoral component was inserted, we trialed off that again and accepted again the +8.5 mm neck length with excellent stability as noted above.  Wound was copiously irrigated.  We closed T capsulotomy with Ethibond followed by running #1 Ethibond on the gluteus maximus fascia, iliotibial band with the subcutaneous tissues with 2-0 Vicryl.  Skin clips on the skin.  Marcaine injected with epinephrine to the skin 0.5%. Taken to the recovery room in stable condition.     Lockie Pares, M.D.     WDC/MEDQ  D:  11/07/2014  T:  11/07/2014  Job:  [id number]

## 2014-11-07 NOTE — Transfer of Care (Signed)
Immediate Anesthesia Transfer of Care Note  Patient: Brandon Ferrell  Procedure(s) Performed: Procedure(s) with comments: RIGHT  TOTAL HIP ARTHROPLASTY (Right) - Would like to flip if possible    Patient Location: PACU  Anesthesia Type:General  Level of Consciousness: awake  Airway & Oxygen Therapy: Patient Spontanous Breathing  Post-op Assessment: Report given to RN and Post -op Vital signs reviewed and stable  Post vital signs: Reviewed and stable  Last Vitals:  Filed Vitals:   11/07/14 0613  BP: 130/57  Pulse: 61  Temp: 36.9 C  Resp: 20    Complications: No apparent anesthesia complications

## 2014-11-07 NOTE — Progress Notes (Signed)
Utilization review completed.  

## 2014-11-07 NOTE — H&P (View-Only) (Signed)
TOTAL HIP ADMISSION H&P  Patient is admitted for right total hip arthroplasty.  Subjective:  Chief Complaint: right hip pain  HPI: Brandon Ferrell, 59 y.o. male, has a history of pain and functional disability in the right hip(s) due to arthritis and patient has failed non-surgical conservative treatments for greater than 12 weeks to include NSAID's and/or analgesics, use of assistive devices and activity modification.  Onset of symptoms was gradual starting 8 years ago with gradually worsening course since that time.The patient noted no past surgery on the right hip(s).  Patient currently rates pain in the right hip at 10 out of 10 with activity. Patient has night pain, worsening of pain with activity and weight bearing, trendelenberg gait, pain that interfers with activities of daily living, pain with passive range of motion, crepitus and joint swelling. Patient has evidence of periarticular osteophytes and joint space narrowing by imaging studies. This condition presents safety issues increasing the risk of falls.  There is no current active infection.  Patient Active Problem List   Diagnosis Date Noted  . HYPERLIPIDEMIA 06/21/2006  . TOBACCO ABUSE 06/21/2006  . PRESBYOPIA 06/21/2006  . HYPERTENSION 06/21/2006  . ABSCESS, GROIN 06/21/2006  . INSOMNIA 06/21/2006   Past Medical History  Diagnosis Date  . Palpitations   . COPD (chronic obstructive pulmonary disease)   . Hypertension     No past surgical history on file.   (Not in a hospital admission) Allergies  Allergen Reactions  . Bee Venom Anaphylaxis  . Shellfish Allergy Anaphylaxis and Hives    Lobester, Spider Bites    History  Substance Use Topics  . Smoking status: Current Every Day Smoker -- 1.00 packs/day  . Smokeless tobacco: Not on file  . Alcohol Use: No    No family history on file.   Review of Systems  Constitutional: Negative.   HENT: Negative.   Eyes: Negative.   Respiratory: Positive for shortness of  breath and wheezing. Negative for cough, hemoptysis and sputum production.   Cardiovascular: Negative.   Gastrointestinal: Negative.   Genitourinary: Negative.   Musculoskeletal: Positive for joint pain. Negative for falls.  Skin: Negative.   Neurological: Negative.   Endo/Heme/Allergies: Negative.   Psychiatric/Behavioral: Negative.     Objective:  Physical Exam  Constitutional: He is oriented to person, place, and time. He appears well-developed and well-nourished. No distress.  HENT:  Head: Normocephalic and atraumatic.  Nose: Nose normal.  Eyes: Conjunctivae and EOM are normal. Pupils are equal, round, and reactive to light.  Neck: Normal range of motion. Neck supple.  Cardiovascular: Normal rate, regular rhythm, normal heart sounds and intact distal pulses.   Respiratory: Effort normal and breath sounds normal. No respiratory distress. He has no wheezes.  GI: Soft. Bowel sounds are normal. He exhibits no distension. There is no tenderness.  Musculoskeletal:       Right hip: He exhibits decreased range of motion, tenderness, bony tenderness and swelling.  Lymphadenopathy:    He has no cervical adenopathy.  Neurological: He is alert and oriented to person, place, and time. No cranial nerve deficit.  Skin: Skin is warm and dry. No rash noted. No erythema.  Psychiatric: He has a normal mood and affect. His behavior is normal.    Vital signs in last 24 hours: @VSRANGES @  Labs:   Estimated body mass index is 30.70 kg/(m^2) as calculated from the following:   Height as of 06/23/14: 5\' 11"  (1.803 m).   Weight as of 06/23/14: 99.791 kg (220  lb).   Imaging Review Plain radiographs demonstrate severe degenerative joint disease of the right hip(s). The bone quality appears to be good for age and reported activity level.  Assessment/Plan:  End stage arthritis, right hip(s)  The patient history, physical examination, clinical judgement of the provider and imaging studies are  consistent with end stage degenerative joint disease of the right hip(s) and total hip arthroplasty is deemed medically necessary. The treatment options including medical management, injection therapy, arthroscopy and arthroplasty were discussed at length. The risks and benefits of total hip arthroplasty were presented and reviewed. The risks due to aseptic loosening, infection, stiffness, dislocation/subluxation,  thromboembolic complications and other imponderables were discussed.  The patient acknowledged the explanation, agreed to proceed with the plan and consent was signed. Patient is being admitted for inpatient treatment for surgery, pain control, PT, OT, prophylactic antibiotics, VTE prophylaxis, progressive ambulation and ADL's and discharge planning.The patient is planning to be discharged home with home health services

## 2014-11-07 NOTE — Progress Notes (Signed)
Subjective: Day of Surgery Procedure(s) (LRB): RIGHT  TOTAL HIP ARTHROPLASTY (Right) Patient reports pain as severe.  Received call that pt severe and uncontrolled with ordered meds.  Objective: Vital signs in last 24 hours: Temp:  [97.6 F (36.4 C)-98.8 F (37.1 C)] 98.8 F (37.1 C) (08/05 1214) Pulse Rate:  [55-88] 60 (08/05 1214) Resp:  [13-20] 16 (08/05 1214) BP: (130-148)/(57-85) 148/84 mmHg (08/05 1214) SpO2:  [95 %-100 %] 100 % (08/05 1214) Weight:  [101.152 kg (223 lb)] 101.152 kg (223 lb) (08/05 0613)  Intake/Output from previous day:   Intake/Output this shift: Total I/O In: 1500 [I.V.:1500] Out: 100 [Blood:100]   Recent Labs  11/07/14 0620  HGB 14.6    Recent Labs  11/07/14 0620  HCT 43.0    Recent Labs  11/07/14 0620  NA 138  K 4.3  GLUCOSE 90   No results for input(s): LABPT, INR in the last 72 hours.  Intact pulses distally Incision: dressing C/D/I  Assessment/Plan: Day of Surgery Procedure(s) (LRB): RIGHT  TOTAL HIP ARTHROPLASTY (Right) Up with therapy  Patient sleeping upon entering room, easily arousable but unable to keep eyes open or carry on conversation or answer simple questions without drifting back to sleep.  On chronic home narcotic pain meds, bid 10mg  oxycodone, will continue on current pain regimen of offirmev, robaxin, q3 10mg  oxycodone and q2prn breakthrough 1mg  dilaudid, close monitoring from nursing staff, will evaluate again tomorrow morning, but not to increase current regimen.   Chriss Czar 11/07/2014, 4:32 PM

## 2014-11-07 NOTE — Interval H&P Note (Signed)
History and Physical Interval Note:  11/07/2014 7:26 AM  Brandon Ferrell  has presented today for surgery, with the diagnosis of OA RIGHT  HIP  The various methods of treatment have been discussed with the patient and family. After consideration of risks, benefits and other options for treatment, the patient has consented to  Procedure(s) with comments: RIGHT  TOTAL HIP ARTHROPLASTY (Right) - Would like to flip if possible   as a surgical intervention .  The patient's history has been reviewed, patient examined, no change in status, stable for surgery.  I have reviewed the patient's chart and labs.  Questions were answered to the patient's satisfaction.     [name],W D

## 2014-11-07 NOTE — Evaluation (Signed)
Occupational Therapy Evaluation Patient Details Name: Brandon Ferrell MRN: [id number] DOB: July 08, 1955 Today's Date: 11/07/2014    History of Present Illness Rt THA   Clinical Impression   Patient presenting with deconditioning, decreased I in self care, increased pain, decreased I in functional transfers/mobility. Patient independent PTA. Patient currently functioning at min A UB ADLs and total A for LB self care. Patient will benefit from acute OT to increase overall independence in the areas of ADLs, functional mobility, safety, and pt/family education in order to safely discharge home.    Follow Up Recommendations  Home health OT    Equipment Recommendations  3 in 1 bedside comode;Tub/shower bench    Recommendations for Other Services       Precautions / Restrictions Precautions Precautions: Posterior Hip;Fall Precaution Booklet Issued: Yes (comment) Precaution Comments: reviewed posterior precautions with pt and family Restrictions Weight Bearing Restrictions: Yes RLE Weight Bearing: Weight bearing as tolerated      Mobility Bed Mobility Overal bed mobility: Needs Assistance Bed Mobility: Sit to Supine     Supine to sit: Mod assist Sit to supine: Mod assist   General bed mobility comments: Pt requiring assistance for trunk and getting R LE into bed. Pt attempting to roll over onto L side and not wanting to put weight onto R hip at all. OT attempting to position pt comfortably in supine while explaining how to not break hip precautions while in bed.  His wife was present for education.  Transfers Overall transfer level: Needs assistance Equipment used: Rolling walker (2 wheeled) Transfers: Sit to/from Stand Sit to Stand: Mod assist         General transfer comment: Pt ambulating 6 steps to return to bed from recliner chair with RW and mod A for balance.     Balance Overall balance assessment: Needs assistance Sitting-balance support: Feet supported;Single  extremity supported Sitting balance-Leahy Scale: Poor Sitting balance - Comments: Pt having extreme difficult distributing weight evenly secondary to pain and leaning to L .   Standing balance support: Bilateral upper extremity supported Standing balance-Leahy Scale: Poor Standing balance comment: RW for support                             ADL Overall ADL's : Needs assistance/impaired         Upper Body Bathing: Minimal assitance;Sitting   Lower Body Bathing: Maximal assistance;Cueing for safety;Sit to/from stand;Total assistance   Upper Body Dressing : Set up;Sitting   Lower Body Dressing: Total assistance;Sit to/from stand   Toilet Transfer: Maximal assistance;BSC;RW;Comfort height toilet   Toileting- Clothing Manipulation and Hygiene: Total assistance;Sit to/from stand;With adaptive equipment         General ADL Comments: Pt currently in 10/10 pain this session. He is grimacing and crying out during assessment. Pt found reclined in recliner chair with wife and son present in room. Pt yelling outm "Please put me back to bed".      Vision     Perception     Praxis      Pertinent Vitals/Pain Pain Assessment: 0-10 Pain Score: 10-Worst pain ever Pain Location: R hip Pain Descriptors / Indicators: Aching;Sharp;Sore Pain Intervention(s): Patient requesting pain meds-RN notified;Monitored during session;Limited activity within patient's tolerance;Repositioned     Hand Dominance Right   Extremity/Trunk Assessment Upper Extremity Assessment Upper Extremity Assessment: Overall WFL for tasks assessed   Lower Extremity Assessment Lower Extremity Assessment: Defer to PT evaluation RLE Deficits / Details:  unable to move independently       Communication Communication Communication: No difficulties   Cognition Arousal/Alertness: Awake/alert Behavior During Therapy: Restless Overall Cognitive Status: Within Functional Limits for tasks assessed                      General Comments       Exercises       Shoulder Instructions      Home Living Family/patient expects to be discharged to:: Private residence Living Arrangements: Spouse/significant other;Children Available Help at Discharge: Family Type of Home: Mobile home Home Access: Ramped entrance     [place]: One level     Bathroom Shower/Tub: Tub/shower unit;Curtain Shower/tub characteristics: Architectural technologist: Standard Bathroom Accessibility: Yes How Accessible: Accessible via walker Home Equipment: None          Prior Functioning/Environment Level of Independence: Independent             OT Diagnosis: Generalized weakness;Acute pain   OT Problem List: Decreased knowledge of use of DME or AE;Decreased knowledge of precautions;Decreased activity tolerance;Pain;Decreased safety awareness;Impaired balance (sitting and/or standing)   OT Treatment/Interventions: Self-care/ADL training;Balance training;Therapeutic exercise;Therapeutic activities;Energy conservation;DME and/or AE instruction;Patient/family education    OT Goals(Current goals can be found in the care plan section) Acute Rehab OT Goals Patient Stated Goal: Get back to moving better than before OT Goal Formulation: With patient Time For Goal Achievement: 11/21/14 Potential to Achieve Goals: Good ADL Goals Pt Will Perform Upper Body Bathing: with modified independence;sitting Pt Will Perform Lower Body Bathing: with supervision;with adaptive equipment;sit to/from stand Pt Will Perform Upper Body Dressing: with modified independence;sitting Pt Will Perform Lower Body Dressing: with supervision;with adaptive equipment;sit to/from stand Pt Will Transfer to Toilet: bedside commode;ambulating;stand pivot transfer;with modified independence Pt Will Perform Toileting - Clothing Manipulation and hygiene: with supervision;sit to/from stand Additional ADL Goal #1: Pt will verbalize posterior  hip precautions with 100% accuracy.  OT Frequency: Min 2X/week   Barriers to D/C:  (none known)          Co-evaluation              End of Session Equipment Utilized During Treatment: Rolling walker  Activity Tolerance: Patient limited by pain Patient left: in bed;with call bell/phone within reach;with nursing/sitter in room;with family/visitor present   Time: 3818-2993 OT Time Calculation (min): 14 min Charges:  OT General Charges $OT Visit: 1 Procedure OT Evaluation $Initial OT Evaluation Tier I: 1 Procedure  Phineas Semen, MS, OTR/L 11/07/2014, 3:59 PM

## 2014-11-07 NOTE — Brief Op Note (Signed)
11/07/2014  9:50 AM  PATIENT:  Brandon Ferrell  59 y.o. male  PRE-OPERATIVE DIAGNOSIS:  OA RIGHT  HIP  POST-OPERATIVE DIAGNOSIS:  OA RIGHT  HIP  PROCEDURE:  Procedure(s) with comments: RIGHT  TOTAL HIP ARTHROPLASTY (Right) - Would like to flip if possible    SURGEON:  Surgeon(s) and Role:    * Earlie Server, MD - Primary  PHYSICIAN ASSISTANT: Chriss Czar, PA-C  ASSISTANTS: x1   ANESTHESIA:   general  EBL:  Total I/O In: -  Out: 100 [Blood:100]  BLOOD ADMINISTERED:none  DRAINS: none   LOCAL MEDICATIONS USED:  NONE  SPECIMEN:  No Specimen  DISPOSITION OF SPECIMEN:  N/A  COUNTS:  YES  TOURNIQUET:  * No tourniquets in log *  DICTATION: .Other Dictation: Dictation Number unknown  PLAN OF CARE: Admit to inpatient   PATIENT DISPOSITION:  PACU - hemodynamically stable.   Delay start of Pharmacological VTE agent (>24hrs) due to surgical blood loss or risk of bleeding: yes

## 2014-11-07 NOTE — Discharge Instructions (Signed)

## 2014-11-07 NOTE — Anesthesia Preprocedure Evaluation (Addendum)
Anesthesia Evaluation  Patient identified by MRN, date of birth, ID band Patient awake    Reviewed: Allergy & Precautions, NPO status , Patient's Chart, lab work & pertinent test results  Airway Mallampati: II  TM Distance: >3 FB Neck ROM: Full    Dental no notable dental hx. (+) Edentulous Upper, Edentulous Lower   Pulmonary pneumonia -, resolved, COPDCurrent Smoker,  breath sounds clear to auscultation  Pulmonary exam normal       Cardiovascular hypertension, Pt. on medications Normal cardiovascular examRhythm:Regular Rate:Normal     Neuro/Psych PSYCHIATRIC DISORDERS negative neurological ROS  negative psych ROS   GI/Hepatic Neg liver ROS, GERD-  ,  Endo/Other  negative endocrine ROS  Renal/GU negative Renal ROS     Musculoskeletal  (+) Arthritis -,   Abdominal (+) + obese,   Peds  Hematology negative hematology ROS (+)   Anesthesia Other Findings   Reproductive/Obstetrics negative OB ROS                           Anesthesia Physical Anesthesia Plan  ASA: II  Anesthesia Plan: General and Spinal   Post-op Pain Management:    Induction: Intravenous  Airway Management Planned:   Additional Equipment:   Intra-op Plan:   Post-operative Plan: Extubation in OR  Informed Consent: I have reviewed the patients History and Physical, chart, labs and discussed the procedure including the risks, benefits and alternatives for the proposed anesthesia with the patient or authorized representative who has indicated his/her understanding and acceptance.   Dental advisory given  Plan Discussed with: CRNA  Anesthesia Plan Comments:         Anesthesia Quick Evaluation

## 2014-11-07 NOTE — Anesthesia Postprocedure Evaluation (Signed)
  Anesthesia Post-op Note  Patient: Brandon Ferrell  Procedure(s) Performed: Procedure(s) with comments: RIGHT  TOTAL HIP ARTHROPLASTY (Right) - Would like to flip if possible    Patient Location: PACU  Anesthesia Type:General  Level of Consciousness: awake  Airway and Oxygen Therapy: Patient Spontanous Breathing  Post-op Pain: mild  Post-op Assessment: Post-op Vital signs reviewed              Post-op Vital Signs: Reviewed  Last Vitals:  Filed Vitals:   11/07/14 1130  BP: 145/85  Pulse: 57  Temp:   Resp: 13    Complications: No apparent anesthesia complications

## 2014-11-07 NOTE — Anesthesia Procedure Notes (Signed)
Procedure Name: Intubation Date/Time: 11/07/2014 7:45 AM Performed by: Kyung Rudd Pre-anesthesia Checklist: Patient identified, Emergency Drugs available, Suction available, Patient being monitored and Timeout performed Patient Re-evaluated:Patient Re-evaluated prior to inductionOxygen Delivery Method: Circle system utilized Preoxygenation: Pre-oxygenation with 100% oxygen Intubation Type: IV induction Ventilation: Mask ventilation without difficulty and Oral airway inserted - appropriate to patient size Laryngoscope Size: Mac and 4 Grade View: Grade I Tube type: Oral Tube size: 7.5 mm Number of attempts: 1 Airway Equipment and Method: Stylet Placement Confirmation: ETT inserted through vocal cords under direct vision,  positive ETCO2 and breath sounds checked- equal and bilateral Secured at: 22 cm Tube secured with: Tape Dental Injury: Teeth and Oropharynx as per pre-operative assessment

## 2014-11-07 NOTE — Evaluation (Signed)
Physical Therapy Evaluation Patient Details Name: Brandon Ferrell MRN: [id number] DOB: Jul 10, 1955 Today's Date: 11/07/2014   History of Present Illness  Rt THA  Clinical Impression  Pt is s/p THA resulting in the deficits listed below (see PT Problem List). Pt will benefit from skilled PT to increase their independence and safety with mobility to allow discharge to home with family assistance. Patient's mobility currently limited by pain.      Follow Up Recommendations Home health PT    Equipment Recommendations  Rolling walker with 5" wheels    Recommendations for Other Services       Precautions / Restrictions Precautions Precautions: Posterior Hip;Fall Precaution Booklet Issued: Yes (comment) Precaution Comments: reviewed with patient and family Restrictions Weight Bearing Restrictions: Yes RLE Weight Bearing: Weight bearing as tolerated      Mobility  Bed Mobility Overal bed mobility: Needs Assistance Bed Mobility: Supine to Sit     Supine to sit: Mod assist     General bed mobility comments: slow mobility due to reports of pain  Transfers Overall transfer level: Needs assistance Equipment used: Rolling walker (2 wheeled) Transfers: Sit to/from Stand Sit to Stand: Min assist         General transfer comment: cues for hand placement and precautions  Ambulation/Gait Ambulation/Gait assistance: Min assist Ambulation Distance (Feet): 5 Feet Assistive device: Rolling walker (2 wheeled) Gait Pattern/deviations: Step-to pattern;Decreased step length - right        Stairs            Wheelchair Mobility    Modified Rankin (Stroke Patients Only)       Balance Overall balance assessment: Needs assistance Sitting-balance support: Feet supported;Single extremity supported Sitting balance-Leahy Scale: Poor     Standing balance support: Bilateral upper extremity supported Standing balance-Leahy Scale: Poor Standing balance comment: using rw for  support                             Pertinent Vitals/Pain Pain Assessment: 0-10 Pain Score: 10-Worst pain ever Pain Location: Rt hip Pain Descriptors / Indicators: Sharp Pain Intervention(s): Monitored during session;Repositioned;Patient requesting pain meds-RN notified;RN gave pain meds during session    Home Living Family/patient expects to be discharged to:: Private residence Living Arrangements: Spouse/significant other Available Help at Discharge: Family Type of Home: Mobile home Home Access: Ramped entrance     Home Layout: One level Home Equipment: None      Prior Function Level of Independence: Independent               Hand Dominance        Extremity/Trunk Assessment               Lower Extremity Assessment: Overall WFL for tasks assessed;RLE deficits/detail (LLE) RLE Deficits / Details: unable to move independently       Communication   Communication: No difficulties  Cognition Arousal/Alertness: Awake/alert Behavior During Therapy: Restless Overall Cognitive Status: Within Functional Limits for tasks assessed                      General Comments      Exercises        Assessment/Plan    PT Assessment Patient needs continued PT services  PT Diagnosis Difficulty walking   PT Problem List Decreased strength;Decreased range of motion;Decreased activity tolerance;Decreased balance;Decreased mobility;Decreased knowledge of use of DME;Pain  PT Treatment Interventions DME instruction;Gait training;Stair training;Functional mobility training;Therapeutic activities;Therapeutic  exercise;Balance training;Patient/family education   PT Goals (Current goals can be found in the Care Plan section) Acute Rehab PT Goals Patient Stated Goal: Get back to moving better than before PT Goal Formulation: With patient Time For Goal Achievement: 11/21/14 Potential to Achieve Goals: Good    Frequency 7X/week   Barriers to discharge         Co-evaluation               End of Session Equipment Utilized During Treatment: Gait belt;Right knee immobilizer (immobilizer on in bed. ) Activity Tolerance: Patient limited by pain Patient left: in chair;with call bell/phone within reach;with nursing/sitter in room;with family/visitor present Nurse Communication: Mobility status;Precautions;Weight bearing status         Time: 1434-1501 PT Time Calculation (min) (ACUTE ONLY): 27 min   Charges:   PT Evaluation $Initial PT Evaluation Tier I: 1 Procedure PT Treatments $Gait Training: 8-22 mins   PT G Codes:        Cassell Clement, PT, CSCS Pager [phone] Office [phone]  11/07/2014, 3:11 PM

## 2014-11-08 LAB — BASIC METABOLIC PANEL
Anion gap: 8 (ref 5–15)
BUN: 16 mg/dL (ref 6–20)
CALCIUM: 8.4 mg/dL — AB (ref 8.9–10.3)
CO2: 24 mmol/L (ref 22–32)
Chloride: 103 mmol/L (ref 101–111)
Creatinine, Ser: 1.58 mg/dL — ABNORMAL HIGH (ref 0.61–1.24)
GFR calc Af Amer: 54 mL/min — ABNORMAL LOW (ref >60)
GFR calc non Af Amer: 46 mL/min — ABNORMAL LOW (ref >60)
Glucose, Bld: 119 mg/dL — ABNORMAL HIGH (ref 65–99)
Potassium: 3.7 mmol/L (ref 3.5–5.1)
Sodium: 135 mmol/L (ref 135–145)

## 2014-11-08 LAB — CBC
HEMATOCRIT: 34.2 % — AB (ref 39.0–52.0)
Hemoglobin: 11.7 g/dL — ABNORMAL LOW (ref 13.0–17.0)
MCH: 30.5 pg (ref 26.0–34.0)
MCHC: 34.2 g/dL (ref 30.0–36.0)
MCV: 89.1 fL (ref 78.0–100.0)
Platelets: 129 10*3/uL — ABNORMAL LOW (ref 150–400)
RBC: 3.84 MIL/uL — ABNORMAL LOW (ref 4.22–5.81)
RDW: 13.9 % (ref 11.5–15.5)
WBC: 10 10*3/uL (ref 4.0–10.5)

## 2014-11-08 NOTE — Progress Notes (Signed)
Patient's abdomen distended; positive bowel sounds. Suppository given @1245 . According to the patient, he stated that, "he had two small clumps" and was passing gas while sitting on the toilet. Patient also stated that, "his abdomen went down but started to get distended again".  Patient no longer passing gas since having the BM. Patient continues to have positive bowel sounds. PA notified of continued distention with bowel sounds. Soap suds enema given per order. Patient also had bladder scan of >999. In and out cath done 1100 ml removed. Will continue to monitor both the voiding and the bowel issues. Patient is aware that he might have to have a foley placed if he is unable to void sufficiently throughout the night.

## 2014-11-08 NOTE — Progress Notes (Signed)
Physical Therapy Treatment Patient Details Name: Brandon Ferrell MRN: [id number] DOB: 09-Jun-1955 Today's Date: 11/08/2014    History of Present Illness Rt THA    PT Comments    Pt progressing with ambulation. Able to ambulate out into hall with min A. Pt in need of cues for safety during ambulation. Pt c/o of pain @ 10/10. RN notified about pain rating. Continue with current POC.  Follow Up Recommendations  Home health PT     Equipment Recommendations  Rolling walker with 5" wheels    Recommendations for Other Services       Precautions / Restrictions Precautions Precautions: Posterior Hip;Fall Precaution Booklet Issued: Yes (comment) Precaution Comments: reviewed posterior precautions with pt Restrictions Weight Bearing Restrictions: Yes RLE Weight Bearing: Weight bearing as tolerated    Mobility  Bed Mobility Overal bed mobility: Needs Assistance Bed Mobility: Sit to Supine       Sit to supine: Min assist   General bed mobility comments: Pt requring min A to manage RLE into bed. VC for technique and sequence.  Transfers Overall transfer level: Needs assistance Equipment used: Rolling walker (2 wheeled) Transfers: Sit to/from Stand Sit to Stand: Min assist         General transfer comment: Min A for powerup into standing from recliner. Cues for hand placement.  Ambulation/Gait Ambulation/Gait assistance: Min assist Ambulation Distance (Feet): 125 Feet Assistive device: Rolling walker (2 wheeled) Gait Pattern/deviations: Step-through pattern;Decreased step length - left;Decreased stance time - right;Trunk flexed   Gait velocity interpretation: Below normal speed for age/gender General Gait Details: Pt requires cues for safety with RW. Cues for upright posture. Min A for safety.   Stairs            Wheelchair Mobility    Modified Rankin (Stroke Patients Only)       Balance                                    Cognition  Arousal/Alertness: Awake/alert Behavior During Therapy: WFL for tasks assessed/performed Overall Cognitive Status: Within Functional Limits for tasks assessed                      Exercises Total Joint Exercises Gluteal Sets: Strengthening;Both;10 reps;Supine Hip ABduction/ADduction: AAROM;Right;5 reps;Supine    General Comments        Pertinent Vitals/Pain Pain Assessment: 0-10 Pain Score: 10-Worst pain ever Pain Location: R hip Pain Descriptors / Indicators: Aching;Constant;Sore Pain Intervention(s): Monitored during session;Repositioned;Patient requesting pain meds-RN notified    Home Living                      Prior Function            PT Goals (current goals can now be found in the care plan section) Progress towards PT goals: Progressing toward goals    Frequency  7X/week    PT Plan Current plan remains appropriate    Co-evaluation             End of Session Equipment Utilized During Treatment: Gait belt Activity Tolerance: Patient tolerated treatment well Patient left: with call bell/phone within reach;in bed     Time: 0912-0937 PT Time Calculation (min) (ACUTE ONLY): 25 min  Charges:  $Gait Training: 8-22 mins $Therapeutic Exercise: 8-22 mins  G Codes:      Jacqualyn Posey 11/08/2014, 9:51 AM

## 2014-11-08 NOTE — Care Management Note (Signed)
Case Management Note  Patient Details  Name: Brandon Ferrell MRN: [id number] Date of Birth: Aug 06, 1955  Subjective/Objective:   60 yr old male s/o right total hip arthroplasty                 Action/Plan:  Case manager spoke with patient concerning home health and DME needs. Patient was preoperatively setup with [facility], no changes. Patient will needs RW and 3in1. Case manager has ordered. Patient has family support at discharge.    Expected Discharge Date:    11/09/14              Expected Discharge Plan:  [place]  In-House Referral:  NA  Discharge planning Services  CM Consult  Post Acute Care Choice:  Durable Medical Equipment Choice offered to:  Patient  DME Arranged:  3-N-1, Walker rolling DME Agency:  [place]:  PT [place]:  [place]  Status of Service:  Completed, signed off  Medicare Important Message Given:    Date Medicare IM Given:    Medicare IM give by:    Date Additional Medicare IM Given:    Additional Medicare Important Message give by:     If discussed at [place] of Stay Meetings, dates discussed:    Additional Comments:  Ninfa Meeker, RN 11/08/2014, 11:31 AM

## 2014-11-08 NOTE — Progress Notes (Signed)
PT Cancellation Note  Patient Details Name: RAJVEER HANDLER MRN: [id number] DOB: 22-May-1955   Cancelled Treatment:    Reason Eval/Treat Not Completed: Pain limiting ability to participate. Patient stated that he was having "gas" pain and was unable to walk at this time. Patient stated that they just got done positioning in him in bed. Requested for therapy to come back in AM.    [place], Tonia Brooms 11/08/2014, 1:21 PM

## 2014-11-08 NOTE — Progress Notes (Signed)
Subjective: 1 Day Post-Op Procedure(s) (LRB): RIGHT  TOTAL HIP ARTHROPLASTY (Right) Patient reports pain as moderate and severe.    Objective: Vital signs in last 24 hours: Temp:  [97.6 F (36.4 C)-99.8 F (37.7 C)] 99.3 F (37.4 C) (08/06 0622) Pulse Rate:  [55-88] 74 (08/06 0622) Resp:  [13-19] 17 (08/06 0622) BP: (125-148)/(60-85) 137/70 mmHg (08/06 0622) SpO2:  [95 %-100 %] 96 % (08/06 0622)  Intake/Output from previous day: 08/05 0701 - 08/06 0700 In: 1500 [I.V.:1500] Out: 800 [Urine:700; Blood:100] Intake/Output this shift: Total I/O In: -  Out: 250 [Urine:250]   Recent Labs  11/07/14 0620 11/08/14 0353  HGB 14.6 11.7*    Recent Labs  11/07/14 0620 11/08/14 0353  WBC  --  10.0  RBC  --  3.84*  HCT 43.0 34.2*  PLT  --  129*    Recent Labs  11/07/14 0620 11/08/14 0353  NA 138 135  K 4.3 3.7  CL  --  103  CO2  --  24  BUN  --  16  CREATININE  --  1.58*  GLUCOSE 90 119*  CALCIUM  --  8.4*   No results for input(s): LABPT, INR in the last 72 hours.  ABD soft Neurovascular intact Sensation intact distally Intact pulses distally Dorsiflexion/Plantar flexion intact Incision: dressing C/D/I positive bowl sounds all four quadrants  Abdomen with mild-moderate distention nonTTP  Assessment/Plan: 1 Day Post-Op Procedure(s) (LRB): RIGHT  TOTAL HIP ARTHROPLASTY (Right) Up with therapy  Constipation- give suppository as ordered Encouraged plenty of po fluids Try to avoid the IV dilaudid today as much as possible  Hopeful d/c home tomorrow with HHPT lovenox teaching for home dvt proph  Chriss Czar 11/08/2014, 9:46 AM

## 2014-11-08 NOTE — Progress Notes (Signed)
CSW order received to assist patient with SNF placement.  Review of chart and PT note indicates recommendation for home health PT. Unit RNCM has arranged Home Health with [facility]. CSW services will sign off as no CSW needs identified at this time. Lorie [place]. Pauline Good, [place] (weekend coverage)

## 2014-11-08 NOTE — Progress Notes (Signed)
OT Cancellation Note  Patient Details Name: [name] MRN: [id number] DOB: [birth year]   Cancelled Treatment:    Reason Eval/Treat Not Completed: Other (comment).  Noted pt declined PT less than 15 minutes ago due to pain and just getting back to bed. Will check back later today or tomorrow.  Darrall Strey 11/08/2014, 1:35 PM  Lesle Chris, OTR/L [phone] 11/08/2014

## 2014-11-09 LAB — CBC
HCT: 32.5 % — ABNORMAL LOW (ref 39.0–52.0)
HEMOGLOBIN: 11 g/dL — AB (ref 13.0–17.0)
MCH: 29.4 pg (ref 26.0–34.0)
MCHC: 33.8 g/dL (ref 30.0–36.0)
MCV: 86.9 fL (ref 78.0–100.0)
Platelets: 122 10*3/uL — ABNORMAL LOW (ref 150–400)
RBC: 3.74 MIL/uL — ABNORMAL LOW (ref 4.22–5.81)
RDW: 13.9 % (ref 11.5–15.5)
WBC: 10.1 10*3/uL (ref 4.0–10.5)

## 2014-11-09 LAB — BASIC METABOLIC PANEL
ANION GAP: 10 (ref 5–15)
BUN: 14 mg/dL (ref 6–20)
CO2: 23 mmol/L (ref 22–32)
CREATININE: 1.29 mg/dL — AB (ref 0.61–1.24)
Calcium: 8.8 mg/dL — ABNORMAL LOW (ref 8.9–10.3)
Chloride: 98 mmol/L — ABNORMAL LOW (ref 101–111)
GFR calc Af Amer: >60 mL/min (ref >60)
GFR calc non Af Amer: 59 mL/min — ABNORMAL LOW (ref >60)
Glucose, Bld: 106 mg/dL — ABNORMAL HIGH (ref 65–99)
Potassium: 3.9 mmol/L (ref 3.5–5.1)
Sodium: 131 mmol/L — ABNORMAL LOW (ref 135–145)

## 2014-11-09 MED ORDER — TAMSULOSIN HCL 0.4 MG PO CAPS
0.4000 mg | ORAL_CAPSULE | Freq: Every day | ORAL | Status: DC
  Administered 2014-11-09 – 2014-11-10 (×2): 0.4 mg via ORAL
  Filled 2014-11-09 (×2): qty 1

## 2014-11-09 MED ORDER — DOCUSATE SODIUM 100 MG PO CAPS
100.0000 mg | ORAL_CAPSULE | Freq: Two times a day (BID) | ORAL | Status: DC
Start: 2014-11-09 — End: 2015-07-02

## 2014-11-09 NOTE — Progress Notes (Signed)
Occupational Therapy Treatment Patient Details Name: Brandon Ferrell MRN: [id number] DOB: 1955/11/26 Today's Date: 11/09/2014    History of present illness Rt THA   OT comments  Patient making good, steady progress towards OT goals. Continue plan of care for now. Pt with less pain this session, than previous OT session. Educated patient on AE (hip kit) and encouraged patient to purchase one for home use. Pt able to verbalize 2/3 posterior hip precautions, needed min verbal cues to remember no bending past 90*.    Follow Up Recommendations  Home health OT;Supervision - Intermittent    Equipment Recommendations  3 in 1 bedside comode;Tub/shower bench;Other (comment) (AE - hip kit)    Recommendations for Other Services  None at this time    Precautions / Restrictions Precautions Precautions: Posterior Hip;Fall Precaution Comments: reviewed posterior precautions with pt, handout given Restrictions Weight Bearing Restrictions: Yes RLE Weight Bearing: Weight bearing as tolerated    Mobility Bed Mobility Overal bed mobility: Needs Assistance Bed Mobility: Sit to Supine     Supine to sit: Min assist     General bed mobility comments: Min assist for RLE management. Cues to maintain hip precautions. Once in bed donned KI to prevent from right hip internal rotation  Transfers Overall transfer level: Needs assistance Equipment used: Rolling walker (2 wheeled) Transfers: Sit to/from Stand Sit to Stand: Supervision General transfer comment: Supervision for safety.     Balance Overall balance assessment: Needs assistance Sitting-balance support: No upper extremity supported;Feet supported Sitting balance-Leahy Scale: Good Sitting balance - Comments: improved sitting balance and weight distribution with less guarding/protective postures over right hip   Standing balance support: Bilateral upper extremity supported;During functional activity Standing balance-Leahy Scale:  Fair Standing balance comment: able to stand bilateral stance without support short time, but unable to single leg stand on operated leg >5 sec and highly dependent on UE support, primarily for stability but also for pain   ADL Overall ADL's : Needs assistance/impaired General ADL Comments: Introduced, educated, demonstrated, and had patient return demonstrate use of AE to increase independence with LB ADLs. Pt receptive and good carryover with education regarding AE. Encouraged pt to purchase kit downstairs, handout given. Pt ambulated <> BR with min guard assistance. Discussed importance of patient not getting up without assistance. Pt states his wife will be home to assist, recommending [facility] for safety at home.      Cognition   Behavior During Therapy: WFL for tasks assessed/performed Overall Cognitive Status: Within Functional Limits for tasks assessed      Exercises Total Joint Exercises Ankle Circles/Pumps: AROM;Strengthening;Right;10 reps;Seated Quad Sets: AROM;Strengthening;10 reps;Seated Hip ABduction/ADduction: AROM;Strengthening;Right;10 reps;Standing (abduction only in RW in standing) Long Arc Quad: AROM;Right;Strengthening;10 reps;Seated Knee Flexion: AROM;Right;5 reps;10 reps;Standing Standing Hip Extension: AROM;Right;10 reps;Standing           Pertinent Vitals/ Pain       Pain Assessment: 0-10 Pain Score: 7  Pain Location: right hip Pain Descriptors / Indicators: Aching;Discomfort;Grimacing Pain Intervention(s): Limited activity within patient's tolerance;Monitored during session;Repositioned   Frequency Min 2X/week     Progress Toward Goals  OT Goals(current goals can now befound in the care plan section)  Progress towards OT goals: Progressing toward goals  Acute Rehab OT Goals Patient Stated Goal: Get back to moving better than before  Plan Discharge plan remains appropriate    End of Session Equipment Utilized During Treatment: Rolling walker;Right knee  immobilizer (R KI at end of session to prevent breaking hip precautions while in bed)  Activity Tolerance Patient tolerated treatment well   Patient Left in bed;with call bell/phone within reach    Time: 4155-1614 OT Time Calculation (min): 20 min  Charges: OT General Charges $OT Visit: 1 Procedure OT Treatments $Self Care/Home Management : 8-22 mins  Drae Mitzel , MS, OTR/L, CLT Pager: [phone]  11/09/2014, 11:43 AM

## 2014-11-09 NOTE — Progress Notes (Signed)
Physical Therapy Treatment Patient Details Name: Brandon Ferrell MRN: [id number] DOB: 06/12/1955 Today's Date: 11/09/2014    History of Present Illness Rt THA    PT Comments    Pt with less restrictions due to pain today. Improving with mobility and gait mechanics. See below for details of session and progress.  Ready for d/c from PT perspective, await medical readiness.  Thanks,   Follow Up Recommendations  Home health PT     Equipment Recommendations       Recommendations for Other Services       Precautions / Restrictions Precautions Precautions: Posterior Hip;Fall Precaution Comments: reviewed posterior precautions with pt Restrictions Weight Bearing Restrictions: Yes RLE Weight Bearing: Weight bearing as tolerated    Mobility  Bed Mobility Overal bed mobility:  (up in /back to chair)                Transfers Overall transfer level: Modified independent Equipment used: Rolling walker (2 wheeled) Transfers: Sit to/from Stand Sit to Stand: Supervision;Modified independent (Device/Increase time)         General transfer comment: supportive and confirmatory cues for technique to stand from/sit to recliner, uses armrests, protects surgical hip, controlled up and down  Ambulation/Gait Ambulation/Gait assistance: Supervision Ambulation Distance (Feet): 225 Feet Assistive device: Rolling walker (2 wheeled) Gait Pattern/deviations: Step-through pattern;Decreased stance time - right;Decreased stride length (decr pelvic rotation right) Gait velocity: still decr   General Gait Details: gait retraining instruction focused on posture, proximity to RW, adjust height of RW to promote shoulder depression and prevent shoulder/neck injury; noted improved WB tolerance with time, pt able to begin rudimentary pelvic rotation and more normalized gait mechanics esp at knee/ankle;    Stairs            Wheelchair Mobility    Modified Rankin (Stroke Patients Only)        Balance Overall balance assessment: Needs assistance Sitting-balance support: Feet supported;No upper extremity supported Sitting balance-Leahy Scale: Good Sitting balance - Comments: improved sitting balance and weight distribution with less guarding/protective postures over right hip   Standing balance support: Bilateral upper extremity supported;During functional activity Standing balance-Leahy Scale: Fair Standing balance comment: able to stand bilateral stance without support short time, but unable to single leg stand on operated leg >5 sec and highly dependent on UE support, primarily for stability but also for pain                    Cognition Arousal/Alertness: Awake/alert Behavior During Therapy: WFL for tasks assessed/performed Overall Cognitive Status: Within Functional Limits for tasks assessed                      Exercises Total Joint Exercises Ankle Circles/Pumps: AROM;Strengthening;Right;10 reps;Seated Quad Sets: AROM;Strengthening;10 reps;Seated Hip ABduction/ADduction: AROM;Strengthening;Right;10 reps;Standing (abduction only in RW in standing) Long Arc Quad: AROM;Right;Strengthening;10 reps;Seated Knee Flexion: AROM;Right;5 reps;10 reps;Standing Standing Hip Extension: AROM;Right;10 reps;Standing    General Comments        Pertinent Vitals/Pain Pain Assessment: 0-10 Pain Score: 7  Pain Location: right hip>abdomen Pain Intervention(s): Limited activity within patient's tolerance;Monitored during session;Premedicated before session (sched meds 5-10 min before session)    Home Living                      Prior Function            PT Goals (current goals can now be found in the care plan section) Acute Rehab PT  Goals Patient Stated Goal: Get back to moving better than before Progress towards PT goals: Progressing toward goals    Frequency  7X/week    PT Plan Current plan remains appropriate    Co-evaluation              End of Session Equipment Utilized During Treatment: Gait belt Activity Tolerance: Patient tolerated treatment well Patient left: with call bell/phone within reach;in chair     Time: 6256-3893 PT Time Calculation (min) (ACUTE ONLY): 31 min  Charges:  $Gait Training: 8-22 mins $Therapeutic Exercise: 8-22 mins                    G Codes:      [name] 11/09/2014, 10:35 AM

## 2014-11-09 NOTE — Progress Notes (Signed)
Subjective: 2 Days Post-Op Procedure(s) (LRB): RIGHT  TOTAL HIP ARTHROPLASTY (Right) Patient reports pain as moderate.   No BM today, abdomen feeling better, +flatus, still with urinary retention currently foley.    Objective: Vital signs in last 24 hours: Temp:  [98.5 F (36.9 C)-99.5 F (37.5 C)] 98.9 F (37.2 C) (08/07 0558) Pulse Rate:  [82-85] 84 (08/07 0558) Resp:  [16-18] 16 (08/07 0558) BP: (115-136)/(68-69) 126/69 mmHg (08/07 0558) SpO2:  [94 %] 94 % (08/07 0914)  Intake/Output from previous day: 08/06 0701 - 08/07 0700 In: 720 [P.O.:720] Out: 2750 [Urine:2750] Intake/Output this shift: Total I/O In: 240 [P.O.:240] Out: -    Recent Labs  11/07/14 0620 11/08/14 0353 11/09/14 0556  HGB 14.6 11.7* 11.0*    Recent Labs  11/08/14 0353 11/09/14 0556  WBC 10.0 10.1  RBC 3.84* 3.74*  HCT 34.2* 32.5*  PLT 129* 122*    Recent Labs  11/07/14 0620 11/08/14 0353  NA 138 135  K 4.3 3.7  CL  --  103  CO2  --  24  BUN  --  16  CREATININE  --  1.58*  GLUCOSE 90 119*  CALCIUM  --  8.4*   No results for input(s): LABPT, INR in the last 72 hours.  Neurovascular intact Sensation intact distally Intact pulses distally Dorsiflexion/Plantar flexion intact Incision: scant drainage and dressing changed No cellulitis present  Assessment/Plan: 2 Days Post-Op Procedure(s) (LRB): RIGHT  TOTAL HIP ARTHROPLASTY (Right)  Urinary retention post op  Up with therapy Plan for discharge tomorrow Discharge home with home health  D/c foley voiding trials Start flomax for retention    Chriss Czar 11/09/2014, 12:44 PM

## 2014-11-09 NOTE — Progress Notes (Signed)
Patient voided and had a more substantial BM on 11/08/14 at 2150 however he did not void again even though he ambulated and we placed a warm pack on his lower abd. Bladder scan done at 0012. Showed 525 ml of urine.  Patient abd was slightly uncomfortable and distended.  Foley catheter placed at 0045 and over 550 ml of urine came out.  His abd is more comfortable and less distended.

## 2014-11-09 NOTE — Discharge Summary (Signed)
PATIENT ID: Brandon Ferrell        MRN:  [id number]          DOB/AGE: 1956-02-23 / 59 y.o.    DISCHARGE SUMMARY  ADMISSION DATE:    11/07/2014 DISCHARGE DATE:   11/10/2014  ADMISSION DIAGNOSIS: OA LEFT HIP    DISCHARGE DIAGNOSIS:  OA RIGHT  HIP    ADDITIONAL DIAGNOSIS: Active Problems:   Primary localized osteoarthritis of right hip Post op urinary retention  Past Medical History  Diagnosis Date  . Palpitations   . Hypertension   . COPD (chronic obstructive pulmonary disease)   . Pneumonia     hx  . GERD (gastroesophageal reflux disease)     occ zantac  . Arthritis     PROCEDURE: Procedure(s): RIGHT  TOTAL HIP ARTHROPLASTY Right on 11/07/2014  CONSULTS: PT/OT      HISTORY:  See H&P in chart  HOSPITAL COURSE:  Brandon Ferrell is a 59 y.o. admitted on 11/07/2014 and found to have a diagnosis of OA RIGHT  HIP.  After appropriate laboratory studies were obtained  they were taken to the operating room on 11/07/2014 and underwent  Procedure(s): RIGHT  TOTAL HIP ARTHROPLASTY  Right.   They were given perioperative antibiotics:      Anti-infectives    Start     Dose/Rate Route Frequency Ordered Stop   11/07/14 1400  ceFAZolin (ANCEF) IVPB 2 g/50 mL premix     2 g 100 mL/hr over 30 Minutes Intravenous Every 6 hours 11/07/14 1156 11/07/14 2054   11/07/14 0608  ceFAZolin (ANCEF) IVPB 2 g/50 mL premix     2 g 100 mL/hr over 30 Minutes Intravenous On call to O.R. 11/07/14 9211 11/07/14 0758    .  Tolerated the procedure well.    POD #1, allowed out of bed to a chair.  PT for ambulation and exercise program.  Constipation and urine retention, given suppository and enema +BM and flatus abdomen distention improved.  IV saline locked.  O2 discontionued.  POD #2, continued PT and ambulation.  Still with urinary retention placed with foley overnight.  Foley d/c voiding trials.   Marland Kitchen POD#3, voiding better, no abdominal pain. The remainder of the hospital course was dedicated to  ambulation and strengthening.   The patient was discharged on 3 days post op in  Stable condition.  Blood products given:none  DIAGNOSTIC STUDIES: Recent vital signs:  Patient Vitals for the past 24 hrs:  BP Temp Temp src Pulse Resp SpO2  11/10/14 0541 139/69 mmHg 99.4 F (37.4 C) Oral 80 16 99 %  11/09/14 2108 - - - 88 18 -  11/09/14 2053 128/75 mmHg 99.8 F (37.7 C) Oral 89 18 95 %  11/09/14 1315 116/68 mmHg 98.8 F (37.1 C) - 82 16 94 %  11/09/14 0914 - - - - - 94 %       Recent laboratory studies:  Recent Labs  11/07/14 0620 11/08/14 0353 11/09/14 0556 11/10/14 0556  WBC  --  10.0 10.1 8.6  HGB 14.6 11.7* 11.0* 10.9*  HCT 43.0 34.2* 32.5* 32.3*  PLT  --  129* 122* 132*    Recent Labs  11/07/14 0620 11/08/14 0353 11/09/14 1420  NA 138 135 131*  K 4.3 3.7 3.9  CL  --  103 98*  CO2  --  24 23  BUN  --  16 14  CREATININE  --  1.58* 1.29*  GLUCOSE 90 119* 106*  CALCIUM  --  8.4* 8.8*   Lab Results  Component Value Date   INR 1.05 10/27/2014   INR 0.9 06/20/2007     Recent Radiographic Studies :  Dg Chest 2 View  10/27/2014   CLINICAL DATA:  Preop osteoarthritis right hip.  EXAM: CHEST  2 VIEW  COMPARISON:  06/20/2014  FINDINGS: The heart size and mediastinal contours are within normal limits. Both lungs are clear. The visualized skeletal structures are unremarkable.  IMPRESSION: No active cardiopulmonary disease.   Electronically Signed   By: Rolm Baptise M.D.   On: 10/27/2014 12:15   Dg Hip Port Unilat With Pelvis 1v Right  11/07/2014   CLINICAL DATA:  Post right total hip replacement  EXAM: DG HIP (WITH OR WITHOUT PELVIS) 1V PORT RIGHT  COMPARISON:  None.  FINDINGS: Right hip replacement. Normal alignment. No hardware or bony complicating feature. Moderate degenerative changes in the left hip.  IMPRESSION: Right hip replacement without complicating feature.   Electronically Signed   By: Rolm Baptise M.D.   On: 11/07/2014 10:58    DISCHARGE  INSTRUCTIONS:   DISCHARGE MEDICATIONS:     Medication List    STOP taking these medications        ALEVE 220 MG tablet  Generic drug:  naproxen sodium     Oxycodone HCl 10 MG Tabs      TAKE these medications        albuterol 108 (90 BASE) MCG/ACT inhaler  Commonly known as:  PROVENTIL HFA;VENTOLIN HFA  Inhale 2 puffs into the lungs every 6 (six) hours as needed for wheezing or shortness of breath.     amLODipine 5 MG tablet  Commonly known as:  NORVASC  Take 5 mg by mouth daily.     budesonide-formoterol 160-4.5 MCG/ACT inhaler  Commonly known as:  SYMBICORT  Inhale 2 puffs into the lungs 2 (two) times daily.     diphenhydrAMINE 25 MG tablet  Commonly known as:  BENADRYL  Take 25 mg by mouth at bedtime.     docusate sodium 100 MG capsule  Commonly known as:  COLACE  Take 1 capsule (100 mg total) by mouth 2 (two) times daily.     enoxaparin 30 MG/0.3ML injection  Commonly known as:  LOVENOX  Inject 0.3 mLs (30 mg total) into the skin every 12 (twelve) hours.     lisinopril-hydrochlorothiazide 20-12.5 MG per tablet  Commonly known as:  PRINZIDE,ZESTORETIC  Take 1 tablet by mouth daily.     oxyCODONE-acetaminophen 10-325 MG per tablet  Commonly known as:  PERCOCET  Take 1 tablet by mouth every 4 (four) hours as needed for pain.        FOLLOW UP VISIT:   Follow-up Information    Follow up with CAFFREY JR,W D, MD. Schedule an appointment as soon as possible for a visit in 2 weeks.   Specialty:  Orthopedic Surgery   Contact information:   [place] [address] [phone]       Follow up with [facility].   Why:  Someone from [facility] will consult you concerning start date and time for therapy.   Contact information:   [address] [place] [address] [phone]       DISPOSITION:   Home  CONDITION:  Stable   Chriss Czar, PA-C  11/10/2014 8:46 AM

## 2014-11-09 NOTE — Progress Notes (Signed)
While assessing the pt his E-cigarette fell to the floor I informed him that this is against hospital policy. He wanted me to place in bag I told him no because day shift RN had this conversation with him and gave it to his wife with the promise she would not give it to him. I told him that it will be at front desk when discharged. I labeled it place in bag and told charge RN that on discharge it is at front desk.

## 2014-11-10 ENCOUNTER — Encounter (HOSPITAL_COMMUNITY): Payer: Self-pay | Admitting: Orthopedic Surgery

## 2014-11-10 LAB — CBC
HCT: 32.3 % — ABNORMAL LOW (ref 39.0–52.0)
HEMOGLOBIN: 10.9 g/dL — AB (ref 13.0–17.0)
MCH: 29.6 pg (ref 26.0–34.0)
MCHC: 33.7 g/dL (ref 30.0–36.0)
MCV: 87.8 fL (ref 78.0–100.0)
PLATELETS: 132 10*3/uL — AB (ref 150–400)
RBC: 3.68 MIL/uL — AB (ref 4.22–5.81)
RDW: 13.8 % (ref 11.5–15.5)
WBC: 8.6 10*3/uL (ref 4.0–10.5)

## 2014-11-10 MED ORDER — TAMSULOSIN HCL 0.4 MG PO CAPS: 0.4000 mg | ORAL_CAPSULE | Freq: Every day | ORAL | Status: DC

## 2014-11-10 MED ORDER — POLYETHYLENE GLYCOL 3350 17 G PO PACK
17.0000 g | PACK | Freq: Every day | ORAL | Status: DC
  Administered 2014-11-10: 17 g via ORAL
  Filled 2014-11-10: qty 1

## 2014-11-10 MED ORDER — ENOXAPARIN (LOVENOX) PATIENT EDUCATION KIT
PACK | Freq: Once | Status: AC
  Administered 2014-11-10: 15:00:00
  Filled 2014-11-10: qty 1

## 2014-11-10 NOTE — Progress Notes (Signed)
Reviewed d/c print out regarding medications, instructions, exercises and additional information with patient and his wife along with d/c Rx's - answered all questions. Provided and reviewed Lovenox instruction box and demonstration/ return demonstration r/t Lovenox self administration; took Lovenox box teaching tool with personal belongings. States they have a family relative who is an Therapist, sports they could call if they should have questions. All personal items collected. Changed dressing d/t small amount serosanguineous drainage - surgical site intact, well proxmated and w/o SxS of infection. Medicated for pain for the ride home. Taken by RN with home equipment via w/c to [employer] exit to meet his wife and son for drive home.

## 2014-11-10 NOTE — Progress Notes (Signed)
Physical Therapy Treatment Patient Details Name: Brandon Ferrell MRN: [id number] DOB: [birth year] Today's Date: 11/10/2014    History of Present Illness Rt THA    PT Comments    Patient is making good progress with PT.  From a mobility standpoint anticipate patient will be ready for DC home. Patient denies any questions or concerns.     Follow Up Recommendations  Home health PT     Equipment Recommendations  Rolling walker with 5" wheels    Recommendations for Other Services       Precautions / Restrictions Precautions Precautions: Posterior Hip Precaution Booklet Issued: Yes (comment) Precaution Comments: precautions/HEP, patient 3/3 with precautions recall Restrictions Weight Bearing Restrictions: Yes RLE Weight Bearing: Weight bearing as tolerated    Mobility  Bed Mobility                  Transfers Overall transfer level: Needs assistance Equipment used: Rolling walker (2 wheeled) Transfers: Sit to/from Stand Sit to Stand: Modified independent (Device/Increase time)            Ambulation/Gait Ambulation/Gait assistance: Supervision Ambulation Distance (Feet): 250 Feet Assistive device: Rolling walker (2 wheeled) Gait Pattern/deviations: Step-through pattern         Stairs Stairs:  (declined, has ramp to enter)          Wheelchair Mobility    Modified Rankin (Stroke Patients Only)       Balance Overall balance assessment: Needs assistance Sitting-balance support: No upper extremity supported Sitting balance-Leahy Scale: Normal     Standing balance support: No upper extremity supported Standing balance-Leahy Scale: Fair                      Cognition Arousal/Alertness: Awake/alert Behavior During Therapy: WFL for tasks assessed/performed Overall Cognitive Status: Within Functional Limits for tasks assessed                      Exercises Total Joint Exercises Ankle Circles/Pumps: AROM;Strengthening;Right;10  reps;Seated Quad Sets: AROM;Strengthening;10 reps;Seated Short Arc Quad: Strengthening;Right;5 reps Heel Slides: AROM;Right;10 reps Hip ABduction/ADduction: Strengthening;Right;10 reps Long Arc Quad:  (verbal review) Marching in Standing: 5 reps;Right;Strengthening Standing Hip Extension:  (verbal review)    General Comments        Pertinent Vitals/Pain Pain Assessment: 0-10 Pain Score: 7  Pain Descriptors / Indicators: Aching;Sharp Pain Intervention(s): Monitored during session    Home Living                      Prior Function            PT Goals (current goals can now be found in the care plan section) Acute Rehab PT Goals Patient Stated Goal: Go home PT Goal Formulation: With patient Time For Goal Achievement: 11/21/14 Potential to Achieve Goals: Good Progress towards PT goals: Progressing toward goals    Frequency  7X/week    PT Plan Current plan remains appropriate    Co-evaluation             End of Session Equipment Utilized During Treatment: Gait belt Activity Tolerance: Patient tolerated treatment well Patient left: with call bell/phone within reach;in chair     Time: 8099-8338 PT Time Calculation (min) (ACUTE ONLY): 25 min  Charges:  $Gait Training: 8-22 mins $Therapeutic Exercise: 8-22 mins                    G Codes:      Marland Kitchen  Cassell Clement, PT, CSCS Pager [phone] Office [phone]  11/10/2014, 10:05 AM

## 2014-11-10 NOTE — Progress Notes (Signed)
Subjective: 3 Days Post-Op Procedure(s) (LRB): RIGHT  TOTAL HIP ARTHROPLASTY (Right) Patient reports pain as mild and moderate.  +flatus, no abdominal pain but still with some constipation, voiding better, states he is not having burning pain with urination but does have some groin pain on the right.  Objective: Vital signs in last 24 hours: Temp:  [98.8 F (37.1 C)-99.8 F (37.7 C)] 99.4 F (37.4 C) (08/08 0541) Pulse Rate:  [80-89] 80 (08/08 0541) Resp:  [16-18] 16 (08/08 0541) BP: (116-139)/(68-75) 139/69 mmHg (08/08 0541) SpO2:  [94 %-99 %] 99 % (08/08 0541)  Intake/Output from previous day: 08/07 0701 - 08/08 0700 In: 920 [P.O.:920] Out: 1750 [Urine:1750] Intake/Output this shift:     Recent Labs  11/08/14 0353 11/09/14 0556 11/10/14 0556  HGB 11.7* 11.0* 10.9*    Recent Labs  11/09/14 0556 11/10/14 0556  WBC 10.1 8.6  RBC 3.74* 3.68*  HCT 32.5* 32.3*  PLT 122* 132*    Recent Labs  11/08/14 0353 11/09/14 1420  NA 135 131*  K 3.7 3.9  CL 103 98*  CO2 24 23  BUN 16 14  CREATININE 1.58* 1.29*  GLUCOSE 119* 106*  CALCIUM 8.4* 8.8*   No results for input(s): LABPT, INR in the last 72 hours.  Neurovascular intact Sensation intact distally Intact pulses distally Dorsiflexion/Plantar flexion intact Incision: dressing C/D/I Compartment soft  Assessment/Plan: 3 Days Post-Op Procedure(s) (LRB): RIGHT  TOTAL HIP ARTHROPLASTY (Right) Up with therapy Discharge home with home health  lovenox teaching miralax for bowels before he goes today   Chriss Czar 11/10/2014, 8:41 AM

## 2014-11-10 NOTE — Progress Notes (Signed)
OT Cancellation Note  Patient Details Name: ACETON KINNEAR MRN: [id number] DOB: 05-09-1955   Cancelled Treatment:    Reason Eval/Treat Not Completed: Patient declined, no reason specified. Pt found in supine like position in recliner. Attempted to see patient for education on transferring in/out of tub/shower using BSC vs tub transfer bench, and further education on posterior hip precautions & use of AE for LB ADLs. Pt refused education and OT therapy session secondary to "my bowels are acting up" and "I'm sleepy". Therapist encouraged patient to work for safety education. Patient continued to refuse. Encouraged patient not to attempt shower transfer until therapist assisted him and educated him at home, pt verbalized understanding of this. Discharge orders are in chart and signed, will attempt to see patient as time allows.   Phynix Horton , MS, OTR/L, CLT Pager: [phone]  11/10/2014, 9:28 AM

## 2014-11-26 ENCOUNTER — Ambulatory Visit: Payer: Medicare Other | Attending: Orthopedic Surgery | Admitting: Physical Therapy

## 2014-11-26 DIAGNOSIS — R29898 Other symptoms and signs involving the musculoskeletal system: Secondary | ICD-10-CM | POA: Insufficient documentation

## 2014-11-26 DIAGNOSIS — M25551 Pain in right hip: Secondary | ICD-10-CM | POA: Diagnosis not present

## 2014-11-26 NOTE — Therapy (Signed)
[facility] [place], [place], [address] Phone: [phone]   Fax:  [phone]  Physical Therapy Evaluation  Patient Details  Name: Brandon Ferrell MRN: [id number] Date of Birth: Dec 24, 1955 Referring Provider:  Earlie Server, MD  Encounter Date: 11/26/2014      PT End of Session - 11/26/14 1913    Visit Number 1   Number of Visits 12   Date for PT Re-Evaluation 01/07/15   PT Start Time 0946   PT Stop Time 1032   PT Time Calculation (min) 46 min   Activity Tolerance Patient tolerated treatment well   Behavior During Therapy [facility] for tasks assessed/performed      Past Medical History  Diagnosis Date  . Palpitations   . Hypertension   . COPD (chronic obstructive pulmonary disease)   . Pneumonia     hx  . GERD (gastroesophageal reflux disease)     occ zantac  . Arthritis     Past Surgical History  Procedure Laterality Date  . Lung biopsy  13    pneum, dx copd  . Vasectomy  5/15  . 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 Total hip arthroplasty Right 11/07/2014    Procedure: RIGHT  TOTAL HIP ARTHROPLASTY;  Surgeon: Earlie Server, MD;  Location: [place];  Service: Orthopedics;  Laterality: Right;  Would like to flip if possible      There were no vitals filed for this visit.  Visit Diagnosis:  Right hip pain - Plan: PT plan of care cert/re-cert  Weakness of right hip - Plan: PT plan of care cert/re-cert      Subjective Assessment - 11/26/14 1911    Subjective I need surgery on my left hip also.   Limitations Walking   Patient Stated Goals Walk and do things without pain.   Pain Score 6    Pain Location Hip   Pain Orientation Right   Pain Descriptors / Indicators Aching   Pain Type Surgical pain   Pain Frequency Intermittent            [facility] PT Assessment - 11/26/14 0001    Assessment   Medical Diagnosis Right total hip replacement.   Onset Date/Surgical Date --  11/07/14.   Precautions   Precaution Comments Hip precautions.  No ultrasound.   Restrictions   Weight Bearing Restrictions No   Balance Screen   Has the patient fallen in the past 6 months No   Has the patient had a decrease in activity level because of a fear of falling?  Yes   Is the patient reluctant to leave their home because of a fear of falling?  No   Home Environment   Living Environment Private residence   ROM / Strength   AROM / PROM / Strength Strength   Strength   Overall Strength Comments Right hip abduction= 3+ to 4-/5.   Palpation   Palpation comment Tender on either side of the patient's right hip incisonal site.   Special Tests    Special Tests --  Equal leg lengths.   Ambulation/Gait   Gait Comments Antalgic Trendelenburg gait pattern.                   [facility] Adult PT Treatment/Exercise - 11/26/14 0001    Modalities   Modalities Electrical Stimulation   Electrical Stimulation   Electrical Stimulation Location Constant IFC to right hip x 15 mins on either side of incisional site.  PT Short Term Goals - 12-12-14 1921    PT SHORT TERM GOAL #1   Title Ind with HEP.   Time 2   Period Weeks   Status New           PT Long Term Goals - 2014-12-12 1921    PT LONG TERM GOAL #1   Title Walk without a Trendelenburg gait pattern.   Time 6   Period Weeks   Status New   PT LONG TERM GOAL #2   Title Perform ADL's with right hip pain not > 3/10.               Plan - 2014/12/12 1914    Clinical Impression Statement The patient underwent a right total hip replacement on 11/07/14.  His current pain-level is a 6/10.  He has progressed to using a cane now.  He is well educated regarding his hip precautions.   Pt will benefit from skilled therapeutic intervention in order to improve on the following deficits Pain;Decreased activity tolerance;Decreased strength   PT Frequency 2x / week   PT Duration 6 weeks   PT Treatment/Interventions Electrical  Stimulation;Cryotherapy;Therapeutic activities;Therapeutic exercise;Neuromuscular re-education   PT Next Visit Plan Right hip strengthening and gait related activities.   Consulted and Agree with Plan of Care Patient          G-Codes - December 12, 2014 1922    Functional Assessment Tool Used FOTO.   Functional Limitation Mobility: Walking and moving around   Mobility: Walking and Moving Around Current Status [phone]) At least 40 percent but less than 60 percent impaired, limited or restricted   Mobility: Walking and Moving Around Goal Status [phone]) At least 1 percent but less than 20 percent impaired, limited or restricted       Problem List Patient Active Problem List   Diagnosis Date Noted  . Primary localized osteoarthritis of right hip 11/07/2014  . HYPERLIPIDEMIA 06/21/2006  . TOBACCO ABUSE 06/21/2006  . PRESBYOPIA 06/21/2006  . HYPERTENSION 06/21/2006  . ABSCESS, GROIN 06/21/2006  . INSOMNIA 06/21/2006    Janith Nielson, [place]  MPT  12/12/14, 7:25 PM  [facility] [address] [place], [place], [address] Phone: [phone]   Fax:  [phone]

## 2014-11-27 ENCOUNTER — Ambulatory Visit: Payer: Medicare Other | Admitting: *Deleted

## 2014-11-27 ENCOUNTER — Encounter: Payer: Self-pay | Admitting: *Deleted

## 2014-11-27 DIAGNOSIS — M25551 Pain in right hip: Secondary | ICD-10-CM | POA: Diagnosis not present

## 2014-11-27 DIAGNOSIS — R29898 Other symptoms and signs involving the musculoskeletal system: Secondary | ICD-10-CM

## 2014-11-27 NOTE — Therapy (Signed)
[facility] [place], [place], [address] Phone: [phone]   Fax:  [phone]  Physical Therapy Treatment  Patient Details  Name: ADALID BECKMANN MRN: [id number] Date of Birth: [birth year] Referring Provider:  Lucia Gaskins, MD  Encounter Date: 11/27/2014      PT End of Session - 11/27/14 1446    Visit Number 2   Number of Visits 12   Date for PT Re-Evaluation 01/07/15   PT Start Time 6962   PT Stop Time 1435   PT Time Calculation (min) 47 min      Past Medical History  Diagnosis Date  . Palpitations   . Hypertension   . COPD (chronic obstructive pulmonary disease)   . Pneumonia     hx  . GERD (gastroesophageal reflux disease)     occ zantac  . Arthritis     Past Surgical History  Procedure Laterality Date  . Lung biopsy  13    pneum, dx copd  . Vasectomy  5/15  . 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 Total hip arthroplasty Right 11/07/2014    Procedure: RIGHT  TOTAL HIP ARTHROPLASTY;  Surgeon: Earlie Server, MD;  Location: [place];  Service: Orthopedics;  Laterality: Right;  Would like to flip if possible      There were no vitals filed for this visit.  Visit Diagnosis:  Right hip pain  Weakness of right hip      Subjective Assessment - 11/27/14 1353    Subjective I need surgery on my left hip also., RT hip 6/10. electrodes helped   Limitations Walking   Patient Stated Goals Walk and do things without pain.   Pain Score 6    Pain Orientation Right   Pain Descriptors / Indicators Aching   Pain Type Surgical pain   Pain Frequency Intermittent                         [facility] Adult PT Treatment/Exercise - 11/27/14 0001    Exercises   Exercises Knee/Hip   Knee/Hip Exercises: Aerobic   Nustep L5,6,7 x 10 mins seat 13 V/Cs not to overdue it   Knee/Hip Exercises: Standing   Heel Raises Both;3 sets;10 reps   Hip Flexion Both;3 sets;10 reps   Hip ADduction  Strengthening;Right;3 sets;10 reps   Forward Step Up Right;Step Height: 6";3 sets;10 reps   Rocker Board 5 minutes;3 minutes  calf stretching/ balance, F/B and side to side   Modalities   Modalities Electrical Stimulation   Electrical Stimulation   Electrical Stimulation Location Constant IFC to right hip x 6 mins on either side of incisional site.                  PT Short Term Goals - 11/26/14 1921    PT SHORT TERM GOAL #1   Title Ind with HEP.   Time 2   Period Weeks   Status New           PT Long Term Goals - 11/26/14 1921    PT LONG TERM GOAL #1   Title Walk without a Trendelenburg gait pattern.   Time 6   Period Weeks   Status New   PT LONG TERM GOAL #2   Title Perform ADL's with right hip pain not > 3/10.               Plan - 11/27/14 1447    Clinical Impression Statement The pt  did fairly well with todays Rx, but goes fairly intense with ex.'s and needs a reminder that he is only 3 weeks out from surgery. He is independent with current HEP   Pt will benefit from skilled therapeutic intervention in order to improve on the following deficits Pain;Decreased activity tolerance;Decreased strength   PT Frequency 2x / week   PT Duration 6 weeks   PT Treatment/Interventions Electrical Stimulation;Cryotherapy;Therapeutic activities;Therapeutic exercise;Neuromuscular re-education   PT Next Visit Plan Right hip strengthening and gait related activities. No charge for IFC due to only 6 mins pt had to leave early   Consulted and Agree with Plan of Care Patient          G-Codes - 12-07-2014 1922    Functional Assessment Tool Used FOTO.   Functional Limitation Mobility: Walking and moving around   Mobility: Walking and Moving Around Current Status [phone]) At least 40 percent but less than 60 percent impaired, limited or restricted   Mobility: Walking and Moving Around Goal Status [phone]) At least 1 percent but less than 20 percent impaired, limited or  restricted      Problem List Patient Active Problem List   Diagnosis Date Noted  . Primary localized osteoarthritis of right hip 11/07/2014  . HYPERLIPIDEMIA 06/21/2006  . TOBACCO ABUSE 06/21/2006  . PRESBYOPIA 06/21/2006  . HYPERTENSION 06/21/2006  . ABSCESS, GROIN 06/21/2006  . INSOMNIA 06/21/2006    Chey Rachels,CHRIS, PTA 11/27/2014, 2:59 PM  [facility] [address] [place], [place], [address] Phone: [phone]   Fax:  [phone]

## 2014-12-02 ENCOUNTER — Encounter: Payer: Self-pay | Admitting: Physical Therapy

## 2014-12-02 ENCOUNTER — Ambulatory Visit: Payer: Medicare Other | Admitting: Physical Therapy

## 2014-12-02 DIAGNOSIS — R29898 Other symptoms and signs involving the musculoskeletal system: Secondary | ICD-10-CM

## 2014-12-02 DIAGNOSIS — M25551 Pain in right hip: Secondary | ICD-10-CM

## 2014-12-02 NOTE — Therapy (Signed)
[facility] [place], [place], [address] Phone: [phone]   Fax:  [phone]  Physical Therapy Treatment  Patient Details  Name: Brandon Ferrell MRN: [id number] Date of Birth: 16-Mar-1956 Referring Provider:  Lucia Gaskins, MD  Encounter Date: 12/02/2014      PT End of Session - 12/02/14 1215    Visit Number 3   Number of Visits 12   Date for PT Re-Evaluation 01/07/15   PT Start Time 0949   PT Stop Time 1036   PT Time Calculation (min) 47 min   Activity Tolerance Patient tolerated treatment well   Behavior During Therapy [facility] for tasks assessed/performed      Past Medical History  Diagnosis Date  . Palpitations   . Hypertension   . COPD (chronic obstructive pulmonary disease)   . Pneumonia     hx  . GERD (gastroesophageal reflux disease)     occ zantac  . Arthritis     Past Surgical History  Procedure Laterality Date  . Lung biopsy  13    pneum, dx copd  . Vasectomy  5/15  . 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  Visit Diagnosis:  Right hip pain  Weakness of right hip      Subjective Assessment - 12/02/14 1213    Subjective States that today he hurts mostly in R anterior thigh and that his balance is improving.   Limitations Walking   Patient Stated Goals Walk and do things without pain.   Currently in Pain? Yes   Pain Score 6    Pain Location Hip   Pain Orientation Right;Anterior   Pain Descriptors / Indicators Dull;Aching   Pain Type Surgical pain            [facility] PT Assessment - 12/02/14 0001    Assessment   Medical Diagnosis Right total hip replacement.   Onset Date/Surgical Date 11/07/14   Next MD Visit 12/15/2014   Precautions   Precaution Comments Hip precautions.  No ultrasound.   Restrictions   Weight Bearing Restrictions No                     [facility] Adult PT Treatment/Exercise - 12/02/14 0001    Knee/Hip Exercises: Aerobic   Nustep L5,6,7 x 10 mins seat 13   Knee/Hip Exercises: Standing   Heel Raises Both;3 sets;10 reps   Hip Flexion AROM;Right;3 sets;10 reps;Knee bent;Other (comment)  Below 90 deg per posterior hip precautions   Hip Abduction AROM;Right;3 sets;10 reps;Knee straight   Forward Step Up Right;3 sets;10 reps;Hand Hold: 2;Step Height: 6";Other (comment)  2x10 reps on 6" step; 1x10 rep on 10" step that was <90 deg   Forward Step Up Limitations Patient requested to try 1x10 reps on 10" step but was less than 90 deg R hip flexion   Rocker Board 3 minutes   Knee/Hip Exercises: Seated   Long Arc Quad Strengthening;Right;3 sets;10 reps;Weights   Long Arc Quad Weight 4 lbs.   Ball Squeeze x30 reps   Modalities   Modalities Buyer, retail Surrounding R hip incision   Herbalist  Parameters 1-10 Hz x15 min   Electrical Stimulation Goals Pain                  PT Short Term Goals - 12/02/14 1218    PT SHORT TERM GOAL #1   Title Ind with HEP.   Time 2   Period Weeks   Status Achieved           PT Long Term Goals - 12/02/14 1219    PT LONG TERM GOAL #1   Title Walk without a Trendelenburg gait pattern.   Time 6   Period Weeks   Status On-going   PT LONG TERM GOAL #2   Title Perform ADL's with right hip pain not > 3/10.   Status On-going               Plan - 12/02/14 1215    Clinical Impression Statement Patient tolerated treatment well today although he had to be instructed in being careful with activities secondary to hip precautions. Completed all exercises well following initial instructions in technique and was cautioned  regarding not being as intense with exercises. Normal modalities response noted following removal of the modalities. Had pain patch applied over the R hip incision. Epxerienced 5/10 soreness in R hip joint following treatment.   Pt will benefit from skilled therapeutic intervention in order to improve on the following deficits Pain;Decreased activity tolerance;Decreased strength   PT Frequency 2x / week   PT Duration 6 weeks   PT Treatment/Interventions Electrical Stimulation;Cryotherapy;Therapeutic activities;Therapeutic exercise;Neuromuscular re-education   PT Next Visit Plan Right hip strengthening and gait related activities.    Consulted and Agree with Plan of Care Patient        Problem List Patient Active Problem List   Diagnosis Date Noted  . Primary localized osteoarthritis of right hip 11/07/2014  . HYPERLIPIDEMIA 06/21/2006  . TOBACCO ABUSE 06/21/2006  . PRESBYOPIA 06/21/2006  . HYPERTENSION 06/21/2006  . ABSCESS, GROIN 06/21/2006  . INSOMNIA 06/21/2006    Wynelle Fanny, PTA 12/02/2014, 12:24 PM  [facility] [address] [place], [place], [address] Phone: [phone]   Fax:  [phone]

## 2014-12-04 ENCOUNTER — Ambulatory Visit: Payer: Medicare Other | Attending: Orthopedic Surgery | Admitting: Physical Therapy

## 2014-12-04 DIAGNOSIS — M25551 Pain in right hip: Secondary | ICD-10-CM

## 2014-12-04 DIAGNOSIS — R29898 Other symptoms and signs involving the musculoskeletal system: Secondary | ICD-10-CM | POA: Diagnosis present

## 2014-12-04 NOTE — Therapy (Signed)
[facility] [place], [place], [address] Phone: [phone]   Fax:  [phone]  Physical Therapy Treatment  Patient Details  Name: Brandon Ferrell MRN: [id number] Date of Birth: February 21, 1956 Referring Provider:  Lucia Gaskins, MD  Encounter Date: 12/04/2014      PT End of Session - 12/04/14 0947    Visit Number 4   Number of Visits 12   Date for PT Re-Evaluation 01/07/15   PT Start Time 0947   PT Stop Time 1038   PT Time Calculation (min) 51 min      Past Medical History  Diagnosis Date  . Palpitations   . Hypertension   . COPD (chronic obstructive pulmonary disease)   . Pneumonia     hx  . GERD (gastroesophageal reflux disease)     occ zantac  . Arthritis     Past Surgical History  Procedure Laterality Date  . Lung biopsy  13    pneum, dx copd  . Vasectomy  5/15  . 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 Total hip arthroplasty Right 11/07/2014    Procedure: RIGHT  TOTAL HIP ARTHROPLASTY;  Surgeon: Earlie Server, MD;  Location: [place];  Service: Orthopedics;  Laterality: Right;  Would like to flip if possible      There were no vitals filed for this visit.  Visit Diagnosis:  Weakness of right hip  Right hip pain      Subjective Assessment - 12/04/14 0950    Subjective States pain is about a 5/10 today.   Currently in Pain? Yes   Pain Score 5    Pain Location Hip   Pain Orientation Right;Anterior   Pain Descriptors / Indicators Aching;Dull   Pain Type Surgical pain                         [facility] Adult PT Treatment/Exercise - 12/04/14 0001    Knee/Hip Exercises: Aerobic   Nustep L6,  x 10   Knee/Hip Exercises: Standing   Heel Raises Both;3 sets;10 reps  toes straight and toes pointed outward   Hip Flexion AROM;Right;5 sets;10 reps;Knee bent;Other (comment)  Below 90 deg per posterior hip precautions   Hip Abduction AROM;Right;3 sets;10 reps;Knee straight  bil   Forward  Step Up Right;3 sets;10 reps;Hand Hold: 0;Other (comment)  12 inch step within hip flexion precautions   SLS on blue foam, no hand hold; 2 trials   Walking with Sports Cord sideways bil with pink x 5 ea; fwd/bwd with orange x 10 ea   Knee/Hip Exercises: Supine   Other Supine Knee/Hip Exercises Bridge 10 second hold x 20  while on stim   Modalities   Modalities Electrical Stimulation   Electrical Stimulation   Electrical Stimulation Location Surrounding R hip incision   Electrical Stimulation Action IFC   Electrical Stimulation Parameters 80-150 Hz   Electrical Stimulation Goals Pain                  PT Short Term Goals - 12/02/14 1218    PT SHORT TERM GOAL #1   Title Ind with HEP.   Time 2   Period Weeks   Status Achieved           PT Long Term Goals - 12/02/14 1219    PT LONG TERM GOAL #1   Title Walk without a Trendelenburg gait pattern.   Time 6   Period Weeks   Status On-going  PT LONG TERM GOAL #2   Title Perform ADL's with right hip pain not > 3/10.   Status On-going               Plan - 12/04/14 1027    Clinical Impression Statement Patient did well with new therex. He has good balance on foam and could progress to more advanced balance activities. Decreased pain to 3.5-4/10 after exercise. No LTGs met at this time.   PT Next Visit Plan Right hip strengthening and gait related activities. Can add advanced balance activites and functional sit to stand.        Problem List Patient Active Problem List   Diagnosis Date Noted  . Primary localized osteoarthritis of right hip 11/07/2014  . HYPERLIPIDEMIA 06/21/2006  . TOBACCO ABUSE 06/21/2006  . PRESBYOPIA 06/21/2006  . HYPERTENSION 06/21/2006  . ABSCESS, GROIN 06/21/2006  . INSOMNIA 06/21/2006    Madelyn Flavors PT  12/04/2014, 10:32 AM  [facility] [address] [place], [place], [address] Phone: [phone]   Fax:  [phone]

## 2014-12-11 ENCOUNTER — Ambulatory Visit: Payer: Medicare Other | Admitting: *Deleted

## 2014-12-11 ENCOUNTER — Encounter: Payer: Self-pay | Admitting: *Deleted

## 2014-12-11 DIAGNOSIS — R29898 Other symptoms and signs involving the musculoskeletal system: Secondary | ICD-10-CM

## 2014-12-11 DIAGNOSIS — M25551 Pain in right hip: Secondary | ICD-10-CM

## 2014-12-11 NOTE — Therapy (Signed)
[facility] [place], [place], [address] Phone: [phone]   Fax:  [phone]  Physical Therapy Treatment  Patient Details  Name: CHUKWUEBUKA CHURCHILL MRN: [id number] Date of Birth: 28-Jan-1956 Referring Provider:  Lucia Gaskins, MD  Encounter Date: 12/11/2014      PT End of Session - 12/11/14 0900    Visit Number 5   Number of Visits 12   Date for PT Re-Evaluation 01/07/15   PT Start Time 0900   PT Stop Time 0958   PT Time Calculation (min) 58 min      Past Medical History  Diagnosis Date  . Palpitations   . Hypertension   . COPD (chronic obstructive pulmonary disease)   . Pneumonia     hx  . GERD (gastroesophageal reflux disease)     occ zantac  . Arthritis     Past Surgical History  Procedure Laterality Date  . Lung biopsy  13    pneum, dx copd  . Vasectomy  5/15  . 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 Total hip arthroplasty Right 11/07/2014    Procedure: RIGHT  TOTAL HIP ARTHROPLASTY;  Surgeon: Earlie Server, MD;  Location: [place];  Service: Orthopedics;  Laterality: Right;  Would like to flip if possible      There were no vitals filed for this visit.  Visit Diagnosis:  Weakness of right hip  Right hip pain      Subjective Assessment - 12/11/14 0858    Subjective States pain is about a 5/10 today. RT hip stays sore   Limitations Walking   Patient Stated Goals Walk and do things without pain.   Currently in Pain? Yes   Pain Score 5    Pain Location Hip   Pain Orientation Right;Anterior   Pain Descriptors / Indicators Aching;Dull   Pain Type Surgical pain   Pain Frequency Intermittent   Aggravating Factors  Doing too much   Pain Relieving Factors Estim, ice                         [facility] Adult PT Treatment/Exercise - 12/11/14 0001    Exercises   Exercises Knee/Hip   Knee/Hip Exercises: Aerobic   Nustep L6,  x 19m with seat at 12 for hip precautions   Knee/Hip  Exercises: Standing   Hip Flexion AROM;Right;5 sets;10 reps;Knee bent;Other (comment)  Below 90 deg per posterior hip precautions   Hip Abduction AROM;Right;3 sets;10 reps;Knee straight  bil   Forward Step Up Right;3 sets;10 reps;Hand Hold: 0;Other (comment)  12 inch step within hip flexion precautions   Rocker Board 3 minutes   SLS on blue foam, no hand hold; 2 trials   Walking with Sports Cord sideways bil with pink x 5 ea; fwd/bwd with orange x 10 ea   Modalities   Modalities Electrical Stimulation   Electrical Stimulation   Electrical Stimulation Location Surrounding R hip incision IFC x 15 mins 80-150hz    Electrical Stimulation Goals Pain  Hip abduction with 2# and balance on BOSU              PT Short Term Goals - 12/02/14 1218    PT SHORT TERM GOAL #1   Title Ind with HEP.   Time 2   Period Weeks   Status Achieved           PT Long Term Goals - 12/02/14 1219    PT LONG TERM GOAL #1   Title Walk without a Trendelenburg gait pattern.   Time 6   Period Weeks   Status On-going   PT LONG TERM GOAL #2   Title Perform ADL's with right hip pain not > 3/10.   Status On-going               Plan - 12/11/14 0900    Clinical Impression Statement Pt did well again with Rx. He still has pain 5/10 at times and was unable to meet    Pt will benefit from skilled therapeutic intervention in order to improve on the following deficits Pain;Decreased activity tolerance;Decreased strength   PT Frequency 2x / week   PT Duration 6 weeks   PT Treatment/Interventions Electrical Stimulation;Cryotherapy;Therapeutic activities;Therapeutic exercise;Neuromuscular re-education   PT Next Visit Plan Right hip strengthening and gait related activities. Can add advanced balance activites and functional sit to stand.   Consulted and Agree with Plan of Care Patient        Problem  List Patient Active Problem List   Diagnosis Date Noted  . Primary localized osteoarthritis of right hip 11/07/2014  . HYPERLIPIDEMIA 06/21/2006  . TOBACCO ABUSE 06/21/2006  . PRESBYOPIA 06/21/2006  . HYPERTENSION 06/21/2006  . ABSCESS, GROIN 06/21/2006  . INSOMNIA 06/21/2006    RAMSEUR,CHRIS, PTA 12/11/2014, 10:28 AM  [facility] [address] [place], [place], [address] Phone: [phone]   Fax:  [phone]

## 2014-12-12 ENCOUNTER — Encounter: Payer: Medicare Other | Admitting: Physical Therapy

## 2014-12-15 ENCOUNTER — Ambulatory Visit: Payer: Medicare Other | Admitting: Physical Therapy

## 2014-12-15 ENCOUNTER — Encounter: Payer: Self-pay | Admitting: Physical Therapy

## 2014-12-15 DIAGNOSIS — R29898 Other symptoms and signs involving the musculoskeletal system: Secondary | ICD-10-CM

## 2014-12-15 DIAGNOSIS — M25551 Pain in right hip: Secondary | ICD-10-CM

## 2014-12-15 NOTE — Therapy (Signed)
[facility] [place], [place], [address] Phone: [phone]   Fax:  [phone]  Physical Therapy Treatment  Patient Details  Name: Brandon Ferrell MRN: [id number] Date of Birth: [birth year] Referring Provider:  Lucia Gaskins, MD  Encounter Date: 12/15/2014      PT End of Session - 12/15/14 1446    Visit Number 6   Number of Visits 12   Date for PT Re-Evaluation 01/07/15   PT Start Time 5009   PT Stop Time 1529   PT Time Calculation (min) 55 min   Activity Tolerance Patient tolerated treatment well   Behavior During Therapy [facility] for tasks assessed/performed      Past Medical History  Diagnosis Date  . Palpitations   . Hypertension   . COPD (chronic obstructive pulmonary disease)   . Pneumonia     hx  . GERD (gastroesophageal reflux disease)     occ zantac  . Arthritis     Past Surgical History  Procedure Laterality Date  . Lung biopsy  13    pneum, dx copd  . Vasectomy  5/15  . 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 Total hip arthroplasty Right 11/07/2014    Procedure: RIGHT  TOTAL HIP ARTHROPLASTY;  Surgeon: Earlie Server, MD;  Location: [place];  Service: Orthopedics;  Laterality: Right;  Would like to flip if possible      There were no vitals filed for this visit.  Visit Diagnosis:  Weakness of right hip  Right hip pain      Subjective Assessment - 12/15/14 1442    Subjective tolerated treatment well today   Limitations Walking   Patient Stated Goals Walk and do things without pain.   Currently in Pain? Yes   Pain Score 4    Pain Location Hip   Pain Orientation Right;Anterior   Pain Descriptors / Indicators Nagging   Pain Type Surgical pain   Aggravating Factors  increased activity   Pain Relieving Factors rest                         [facility] Adult PT Treatment/Exercise - 12/15/14 0001    Knee/Hip Exercises: Aerobic   Elliptical L5 R6 x 67min   Nustep L6,  x 44m  with seat at 12 for hip precautions   Knee/Hip Exercises: Standing   Other Standing Knee Exercises mini squat with upside down bosu 3x10   Other Standing Knee Exercises 4" heel dot 2x10   Knee/Hip Exercises: Supine   Straight Leg Raises Strengthening;Right;3 sets;10 reps  2#   Knee/Hip Exercises: Sidelying   Hip ABduction Strengthening;Right;3 sets;10 reps  2   Electrical Stimulation   Electrical Stimulation Location Surrounding R hip incision IFC x 15 mins 80-150hz    Electrical Stimulation Action IFC   Electrical Stimulation Parameters 1-10hz    Electrical Stimulation Goals Pain                  PT Short Term Goals - 12/02/14 1218    PT SHORT TERM GOAL #1   Title Ind with HEP.   Time 2   Period Weeks   Status Achieved           PT Long Term Goals - 12/02/14 1219    PT LONG TERM GOAL #1   Title Walk without a Trendelenburg gait pattern.   Time 6   Period Weeks   Status On-going   PT LONG TERM GOAL #2  Title Perform ADL's with right hip pain not > 3/10.   Status On-going               Plan - 12/15/14 1504    Clinical Impression Statement Patient progressing with strength activities and has progressed with Elliptical today per MPT. Patient has reported no pain with exercises today. Patient continues to abide by hip precations. Goals ongoing due to pain deficits.   Pt will benefit from skilled therapeutic intervention in order to improve on the following deficits Pain;Decreased activity tolerance;Decreased strength   PT Frequency 2x / week   PT Duration 6 weeks   PT Treatment/Interventions Electrical Stimulation;Cryotherapy;Therapeutic activities;Therapeutic exercise;Neuromuscular re-education   PT Next Visit Plan Right hip strengthening and gait related activities. Can add advanced balance activites and functional sit to stand.   Consulted and Agree with Plan of Care Patient        Problem List Patient Active Problem List   Diagnosis Date Noted   . Primary localized osteoarthritis of right hip 11/07/2014  . HYPERLIPIDEMIA 06/21/2006  . TOBACCO ABUSE 06/21/2006  . PRESBYOPIA 06/21/2006  . HYPERTENSION 06/21/2006  . ABSCESS, GROIN 06/21/2006  . INSOMNIA 06/21/2006    Tobi Groesbeck P, PTA 12/15/2014, 3:29 PM  [facility] [address] [place], [place], [address] Phone: [phone]   Fax:  [phone]

## 2014-12-17 ENCOUNTER — Encounter: Payer: Medicare Other | Admitting: Physical Therapy

## 2015-02-18 DIAGNOSIS — M545 Low back pain: Secondary | ICD-10-CM | POA: Diagnosis not present

## 2015-02-18 DIAGNOSIS — M7552 Bursitis of left shoulder: Secondary | ICD-10-CM | POA: Diagnosis not present

## 2015-02-18 DIAGNOSIS — M2550 Pain in unspecified joint: Secondary | ICD-10-CM | POA: Diagnosis not present

## 2015-02-18 DIAGNOSIS — J45909 Unspecified asthma, uncomplicated: Secondary | ICD-10-CM | POA: Diagnosis not present

## 2015-03-16 ENCOUNTER — Other Ambulatory Visit (HOSPITAL_COMMUNITY): Payer: Self-pay | Admitting: Family Medicine

## 2015-03-16 ENCOUNTER — Ambulatory Visit (HOSPITAL_COMMUNITY)
Admission: RE | Admit: 2015-03-16 | Discharge: 2015-03-16 | Disposition: A | Discharge status: Home / Self Care | Payer: Medicare Other | Source: Ambulatory Visit | Attending: Family Medicine | Admitting: Family Medicine

## 2015-03-16 DIAGNOSIS — M150 Primary generalized (osteo)arthritis: Principal | ICD-10-CM

## 2015-03-16 DIAGNOSIS — M159 Polyosteoarthritis, unspecified: Secondary | ICD-10-CM

## 2015-03-16 DIAGNOSIS — M25512 Pain in left shoulder: Secondary | ICD-10-CM | POA: Diagnosis not present

## 2015-03-16 DIAGNOSIS — S4992XA Unspecified injury of left shoulder and upper arm, initial encounter: Secondary | ICD-10-CM | POA: Diagnosis not present

## 2015-03-19 DIAGNOSIS — E784 Other hyperlipidemia: Secondary | ICD-10-CM | POA: Diagnosis not present

## 2015-03-19 DIAGNOSIS — M2550 Pain in unspecified joint: Secondary | ICD-10-CM | POA: Diagnosis not present

## 2015-03-19 DIAGNOSIS — M545 Low back pain: Secondary | ICD-10-CM | POA: Diagnosis not present

## 2015-03-19 DIAGNOSIS — J45909 Unspecified asthma, uncomplicated: Secondary | ICD-10-CM | POA: Diagnosis not present

## 2015-04-03 ENCOUNTER — Encounter (HOSPITAL_COMMUNITY): Payer: Self-pay | Admitting: *Deleted

## 2015-04-03 ENCOUNTER — Emergency Department (HOSPITAL_COMMUNITY)
Admission: EM | Admit: 2015-04-03 | Discharge: 2015-04-03 | Disposition: A | Discharge status: Home / Self Care | Payer: Medicare Other | Attending: Emergency Medicine | Admitting: Emergency Medicine

## 2015-04-03 DIAGNOSIS — J449 Chronic obstructive pulmonary disease, unspecified: Secondary | ICD-10-CM | POA: Diagnosis not present

## 2015-04-03 DIAGNOSIS — F17210 Nicotine dependence, cigarettes, uncomplicated: Secondary | ICD-10-CM | POA: Insufficient documentation

## 2015-04-03 DIAGNOSIS — Z8701 Personal history of pneumonia (recurrent): Secondary | ICD-10-CM | POA: Insufficient documentation

## 2015-04-03 DIAGNOSIS — Z8719 Personal history of other diseases of the digestive system: Secondary | ICD-10-CM | POA: Insufficient documentation

## 2015-04-03 DIAGNOSIS — Z79899 Other long term (current) drug therapy: Secondary | ICD-10-CM | POA: Diagnosis not present

## 2015-04-03 DIAGNOSIS — Z7951 Long term (current) use of inhaled steroids: Secondary | ICD-10-CM | POA: Diagnosis not present

## 2015-04-03 DIAGNOSIS — I10 Essential (primary) hypertension: Secondary | ICD-10-CM | POA: Diagnosis not present

## 2015-04-03 DIAGNOSIS — L02211 Cutaneous abscess of abdominal wall: Secondary | ICD-10-CM | POA: Diagnosis not present

## 2015-04-03 DIAGNOSIS — Z8739 Personal history of other diseases of the musculoskeletal system and connective tissue: Secondary | ICD-10-CM | POA: Diagnosis not present

## 2015-04-03 DIAGNOSIS — Z7901 Long term (current) use of anticoagulants: Secondary | ICD-10-CM | POA: Insufficient documentation

## 2015-04-03 MED ORDER — DOXYCYCLINE HYCLATE 100 MG PO CAPS: 100.0000 mg | ORAL_CAPSULE | Freq: Two times a day (BID) | ORAL | Status: DC

## 2015-04-03 MED ORDER — HYDROCODONE-ACETAMINOPHEN 5-325 MG PO TABS: 1.0000 | ORAL_TABLET | ORAL | Status: DC | PRN

## 2015-04-03 MED ORDER — DOXYCYCLINE HYCLATE 100 MG PO TABS
100.0000 mg | ORAL_TABLET | Freq: Once | ORAL | Status: AC
  Administered 2015-04-03: 100 mg via ORAL
  Filled 2015-04-03: qty 1

## 2015-04-03 MED ORDER — ONDANSETRON HCL 4 MG PO TABS
4.0000 mg | ORAL_TABLET | Freq: Once | ORAL | Status: AC
  Administered 2015-04-03: 4 mg via ORAL
  Filled 2015-04-03: qty 1

## 2015-04-03 MED ORDER — HYDROCODONE-ACETAMINOPHEN 5-325 MG PO TABS
2.0000 | ORAL_TABLET | Freq: Once | ORAL | Status: AC
  Administered 2015-04-03: 2 via ORAL
  Filled 2015-04-03: qty 2

## 2015-04-03 NOTE — Discharge Instructions (Signed)
Your abscess area is not a candidate for incision and drainage in the emergency department at this time. Your vital signs are well within normal limits. Please see Dr. Arnoldo Morale for surgical evaluation. Please use warm tub soaks daily until seen by the surgeon. Please use doxycycline 2 times daily with food until all taken. Use Tylenol or ibuprofen for mild pain, use Norco for more severe pain. Norco may cause drowsiness, please use with caution. Abscess An abscess (boil or furuncle) is an infected area on or under the skin. This area is filled with yellowish-white fluid (pus) and other material (debris). HOME CARE   Only take medicines as told by your doctor.  If you were given antibiotic medicine, take it as directed. Finish the medicine even if you start to feel better.  If gauze is used, follow your doctor's directions for changing the gauze.  To avoid spreading the infection:  Keep your abscess covered with a bandage.  Wash your hands well.  Do not share personal care items, towels, or whirlpools with others.  Avoid skin contact with others.  Keep your skin and clothes clean around the abscess.  Keep all doctor visits as told. GET HELP RIGHT AWAY IF:   You have more pain, puffiness (swelling), or redness in the wound site.  You have more fluid or blood coming from the wound site.  You have muscle aches, chills, or you feel sick.  You have a fever. MAKE SURE YOU:   Understand these instructions.  Will watch your condition.  Will get help right away if you are not doing well or get worse.   This information is not intended to replace advice given to you by your health care provider. Make sure you discuss any questions you have with your health care provider.   Document Released: 09/07/2007 Document Revised: 09/20/2011 Document Reviewed: 06/04/2011 Elsevier Interactive Patient Education [employer].

## 2015-04-03 NOTE — ED Provider Notes (Signed)
CSN: [id number]     Arrival date & time 04/03/15  2107 History   None    Chief Complaint  Patient presents with  . Abscess     (Consider location/radiation/quality/duration/timing/severity/associated sxs/prior Treatment) HPI Comments: Patient is a 59 year old male who presents to the emergency department with a complaint of abscess of the abdominal wall. Patient states that he has had a small abscess that is recurrent on the lower abdomen wall along his belt line. He states that if the area gets irritated will swell, and will usually go down on its own. Occasionally it will drain on its own. The patient states that time was causing him some discomfort, and he thought he should get it checked. He's not had any high fever. He has not noted any red streaks noted. The patient denies being a diabetic, and he has no medical conditions that would interfere with his immune system.`    The history is provided by the patient.    Past Medical History  Diagnosis Date  . Palpitations   . Hypertension   . COPD (chronic obstructive pulmonary disease) ([place])   . Pneumonia     hx  . GERD (gastroesophageal reflux disease)     occ zantac  . Arthritis    Past Surgical History  Procedure Laterality Date  . Lung biopsy  13    pneum, dx copd  . Vasectomy  5/15  . 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 No pertinent family history. Social History  Substance Use Topics  . Smoking status: Current Every Day Smoker -- 1.00 packs/day for 40 years    Types: Cigarettes  . Smokeless tobacco: Never Used  . Alcohol Use: No    Review of Systems  Skin:       Abscess  All other systems reviewed and are negative.     Allergies  Bee venom and Shellfish allergy  Home  Medications   Prior to Admission medications   Medication Sig Start Date End Date Taking? Authorizing Provider  albuterol (PROVENTIL HFA;VENTOLIN HFA) 108 (90 BASE) MCG/ACT inhaler Inhale 2 puffs into the lungs every 6 (six) hours as needed for wheezing or shortness of breath.    Historical Provider, MD  amLODipine (NORVASC) 5 MG tablet Take 5 mg by mouth daily.    Historical Provider, MD  budesonide-formoterol (SYMBICORT) 160-4.5 MCG/ACT inhaler Inhale 2 puffs into the lungs 2 (two) times daily.    Historical Provider, MD  diphenhydrAMINE (BENADRYL) 25 MG tablet Take 25 mg by mouth at bedtime.    Historical Provider, MD  docusate sodium (COLACE) 100 MG capsule Take 1 capsule (100 mg total) by mouth 2 (two) times daily. 11/09/14   Joshua Chadwell, PA-C  enoxaparin (LOVENOX) 30 MG/0.3ML injection Inject 0.3 mLs (30 mg total) into the skin every 12 (twelve) hours. 11/07/14   Chriss Czar, PA-C  lisinopril-hydrochlorothiazide (PRINZIDE,ZESTORETIC) 20-12.5 MG per tablet Take 1 tablet by mouth daily.    Historical Provider, MD  oxyCODONE-acetaminophen (PERCOCET) 10-325 MG per tablet Take 1 tablet by mouth every 4 (four) hours as needed for pain. 11/07/14   Chriss Czar, PA-C  tamsulosin (FLOMAX) 0.4 MG CAPS capsule Take 1 capsule (0.4 mg total) by mouth daily after lunch. 11/10/14   Chriss Czar, PA-C  BP 160/88 mmHg  Pulse 73  Temp(Src) 98.3 F (36.8 C) (Oral)  Resp 18  Ht 5\' 11"  (1.803 m)  Wt 106.595 kg  BMI 32.79 kg/m2  SpO2 98% Physical Exam  Constitutional: He is oriented to person, place, and time. He appears well-developed and well-nourished.  Non-toxic appearance.  HENT:  Head: Normocephalic.  Right Ear: Tympanic membrane and external ear normal.  Left Ear: Tympanic membrane and external ear normal.  Eyes: EOM and lids are normal. Pupils are equal, round, and reactive to light.  Neck: Normal range of motion. Neck supple. Carotid bruit is not present.  Cardiovascular: Normal rate,  regular rhythm, normal heart sounds, intact distal pulses and normal pulses.   Pulmonary/Chest: Breath sounds normal. No respiratory distress.  Abdominal: Soft. Bowel sounds are normal. There is no tenderness. There is no guarding.  Musculoskeletal: Normal range of motion.  Lymphadenopathy:       Head (right side): No submandibular adenopathy present.       Head (left side): No submandibular adenopathy present.    He has no cervical adenopathy.  Neurological: He is alert and oriented to person, place, and time. He has normal strength. No cranial nerve deficit or sensory deficit.  Skin: Skin is warm and dry.  There is a small nonfluctuant abscess at the mid lower abdominal wall along the belt line. The area is tender to palpation. No red streaking noted. No drainage at this time.  Psychiatric: He has a normal mood and affect. His speech is normal.  Nursing note and vitals reviewed.   ED Course  Procedures (including critical care time) Labs Review Labs Reviewed - No data to display  Imaging Review No results found. I have personally reviewed and evaluated these images and lab results as part of my medical decision-making.   EKG Interpretation None      MDM  The patient has a small abscess of the mid lower abdominal wall along the belt line. The abscess is not a candidate for incision and drainage at this time. The plan at this time is for the patient to use warm tub soaks daily. Prescription is given for doxycycline. The patient is referred to Dr. Arnoldo Morale for surgical evaluation. Patient will use Tylenol or ibuprofen for mild pain, use Norco for more severe pain.    Final diagnoses:  Abscess of abdominal wall    **I have reviewed nursing notes, vital signs, and all appropriate lab and imaging results for this patient.Lily Kocher, PA-C 04/03/15 [place], MD 04/04/15 [phone]

## 2015-04-03 NOTE — ED Notes (Signed)
Pt reporting abscess on abdomen for a couple weeks.  Reporting increased redness and swelling.

## 2015-04-05 DIAGNOSIS — J189 Pneumonia, unspecified organism: Secondary | ICD-10-CM

## 2015-04-05 HISTORY — DX: Pneumonia, unspecified organism: J18.9

## 2015-04-16 DIAGNOSIS — M2550 Pain in unspecified joint: Secondary | ICD-10-CM | POA: Diagnosis not present

## 2015-04-16 DIAGNOSIS — J45909 Unspecified asthma, uncomplicated: Secondary | ICD-10-CM | POA: Diagnosis not present

## 2015-04-16 DIAGNOSIS — M545 Low back pain: Secondary | ICD-10-CM | POA: Diagnosis not present

## 2015-04-16 DIAGNOSIS — E662 Morbid (severe) obesity with alveolar hypoventilation: Secondary | ICD-10-CM | POA: Diagnosis not present

## 2015-04-29 ENCOUNTER — Emergency Department (HOSPITAL_COMMUNITY): Payer: Medicare Other

## 2015-04-29 ENCOUNTER — Emergency Department (HOSPITAL_COMMUNITY)
Admission: EM | Admit: 2015-04-29 | Discharge: 2015-04-29 | Disposition: A | Discharge status: Home / Self Care | Payer: Medicare Other | Attending: Emergency Medicine | Admitting: Emergency Medicine

## 2015-04-29 ENCOUNTER — Encounter (HOSPITAL_COMMUNITY): Payer: Self-pay | Admitting: Emergency Medicine

## 2015-04-29 DIAGNOSIS — K219 Gastro-esophageal reflux disease without esophagitis: Secondary | ICD-10-CM | POA: Diagnosis not present

## 2015-04-29 DIAGNOSIS — R0789 Other chest pain: Secondary | ICD-10-CM | POA: Diagnosis not present

## 2015-04-29 DIAGNOSIS — Z79899 Other long term (current) drug therapy: Secondary | ICD-10-CM | POA: Diagnosis not present

## 2015-04-29 DIAGNOSIS — J441 Chronic obstructive pulmonary disease with (acute) exacerbation: Secondary | ICD-10-CM | POA: Insufficient documentation

## 2015-04-29 DIAGNOSIS — R079 Chest pain, unspecified: Secondary | ICD-10-CM | POA: Diagnosis not present

## 2015-04-29 DIAGNOSIS — R0602 Shortness of breath: Secondary | ICD-10-CM | POA: Diagnosis not present

## 2015-04-29 DIAGNOSIS — Z7951 Long term (current) use of inhaled steroids: Secondary | ICD-10-CM | POA: Diagnosis not present

## 2015-04-29 DIAGNOSIS — R05 Cough: Secondary | ICD-10-CM | POA: Diagnosis not present

## 2015-04-29 DIAGNOSIS — I10 Essential (primary) hypertension: Secondary | ICD-10-CM | POA: Insufficient documentation

## 2015-04-29 DIAGNOSIS — M1990 Unspecified osteoarthritis, unspecified site: Secondary | ICD-10-CM | POA: Insufficient documentation

## 2015-04-29 DIAGNOSIS — Z8701 Personal history of pneumonia (recurrent): Secondary | ICD-10-CM | POA: Insufficient documentation

## 2015-04-29 DIAGNOSIS — F17210 Nicotine dependence, cigarettes, uncomplicated: Secondary | ICD-10-CM | POA: Diagnosis not present

## 2015-04-29 LAB — CBC
HEMATOCRIT: 44.7 % (ref 39.0–52.0)
Hemoglobin: 15.1 g/dL (ref 13.0–17.0)
MCH: 29.9 pg (ref 26.0–34.0)
MCHC: 33.8 g/dL (ref 30.0–36.0)
MCV: 88.5 fL (ref 78.0–100.0)
PLATELETS: 179 10*3/uL (ref 150–400)
RBC: 5.05 MIL/uL (ref 4.22–5.81)
RDW: 14.5 % (ref 11.5–15.5)
WBC: 10.3 10*3/uL (ref 4.0–10.5)

## 2015-04-29 LAB — COMPREHENSIVE METABOLIC PANEL
ALBUMIN: 4.2 g/dL (ref 3.5–5.0)
ALT: 27 U/L (ref 17–63)
AST: 28 U/L (ref 15–41)
Alkaline Phosphatase: 88 U/L (ref 38–126)
Anion gap: 9 (ref 5–15)
BUN: 19 mg/dL (ref 6–20)
CHLORIDE: 101 mmol/L (ref 101–111)
CO2: 29 mmol/L (ref 22–32)
CREATININE: 1.23 mg/dL (ref 0.61–1.24)
Calcium: 10 mg/dL (ref 8.9–10.3)
GFR calc Af Amer: >60 mL/min (ref >60)
GLUCOSE: 99 mg/dL (ref 65–99)
POTASSIUM: 4.2 mmol/L (ref 3.5–5.1)
Sodium: 139 mmol/L (ref 135–145)
Total Bilirubin: 0.5 mg/dL (ref 0.3–1.2)
Total Protein: 7.3 g/dL (ref 6.5–8.1)

## 2015-04-29 LAB — TROPONIN I
Troponin I: <0.03 ng/mL (ref <0.031)
Troponin I: <0.03 ng/mL (ref <0.031)

## 2015-04-29 LAB — D-DIMER, QUANTITATIVE: D-Dimer, Quant: 0.48 ug/mL-FEU (ref 0.00–0.50)

## 2015-04-29 MED ORDER — SODIUM CHLORIDE 0.9 % IV SOLN
INTRAVENOUS | Status: DC
  Administered 2015-04-29: 17:00:00 via INTRAVENOUS

## 2015-04-29 MED ORDER — ASPIRIN 81 MG PO CHEW
324.0000 mg | CHEWABLE_TABLET | Freq: Once | ORAL | Status: AC
  Administered 2015-04-29: 324 mg via ORAL
  Filled 2015-04-29: qty 4

## 2015-04-29 MED ORDER — PANTOPRAZOLE SODIUM 20 MG PO TBEC: 20.0000 mg | DELAYED_RELEASE_TABLET | Freq: Every day | ORAL | Status: DC

## 2015-04-29 NOTE — Discharge Instructions (Signed)

## 2015-04-29 NOTE — ED Notes (Signed)
Pt c/o of SOB and generalized chest tightness that began approx 4 hours ago. Pt has hx of COPD.

## 2015-04-29 NOTE — ED Provider Notes (Signed)
CSN: [id number]     Arrival date & time 04/29/15  1603 History   First MD Initiated Contact with Patient 04/29/15 1613     Chief Complaint  Patient presents with  . Shortness of Breath  . Chest Pain     (Consider location/radiation/quality/duration/timing/severity/associated sxs/prior Treatment) The history is provided by the patient.   Brandon Ferrell is a 60 y.o. male with a history of HTN, COPD and GERD presenting with a 4 hour history of right sided chest pressure along with shortness of breath which he states is not similar to his COPD or his GERD.  His symptoms occurred at rest, shortly after waking. He smoked a cigarette, then ate a meal consisting of mild hot wings and key lime pie, after which his symptoms began.  He denies nausea, vomiting, palpitations, weakness or abdominal pain.  He endorses mild increased symptoms upon walking to the car to come here.  He has had no fevers, chills, cough or recent respiratory infections.  He denies cardiac history, but states he did have a problems with palpitations which are exercise induced.  He has had no treatments prior to arrival.    Past Medical History  Diagnosis Date  . Palpitations   . Hypertension   . COPD (chronic obstructive pulmonary disease) ([place])   . Pneumonia     hx  . GERD (gastroesophageal reflux disease)     occ zantac  . Arthritis    Past Surgical History  Procedure Laterality Date  . Lung biopsy  13    pneum, dx copd  . Vasectomy  5/15  . 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 Social History  Substance Use Topics  . Smoking status: Current Every Day Smoker -- 1.00 packs/day for 40 years    Types: Cigarettes  . Smokeless tobacco: Never Used  . Alcohol Use: No     Review of Systems  Constitutional: Negative for fever.  HENT: Negative for congestion and sore throat.   Eyes: Negative.   Respiratory: Positive for chest tightness and shortness of breath.   Cardiovascular: Negative for chest pain and palpitations.  Gastrointestinal: Negative for nausea, vomiting and abdominal pain.  Genitourinary: Negative.   Musculoskeletal: Negative for joint swelling, arthralgias and neck pain.  Skin: Negative.  Negative for rash and wound.  Neurological: Negative for dizziness, weakness, light-headedness, numbness and headaches.  Psychiatric/Behavioral: Negative.       Allergies  Bee venom and Shellfish allergy  Home Medications   Prior to Admission medications   Medication Sig Start Date End Date Taking? Authorizing Provider  amLODipine (NORVASC) 5 MG tablet Take 5 mg by mouth daily.   Yes Historical Provider, MD  budesonide-formoterol (SYMBICORT) 160-4.5 MCG/ACT inhaler Inhale 2 puffs into the lungs 2 (two) times daily.   Yes Historical Provider, MD  lisinopril-hydrochlorothiazide (PRINZIDE,ZESTORETIC) 20-12.5 MG per tablet Take 1 tablet by mouth daily.   Yes Historical Provider, MD  Oxycodone HCl 10 MG TABS Take 10 mg by mouth every 6 (six) hours as needed. pain 04/16/15  Yes Historical Provider, MD  albuterol (PROVENTIL HFA;VENTOLIN HFA) 108 (90 BASE) MCG/ACT inhaler Inhale 2 puffs into the lungs every 6 (six) hours as needed for wheezing or shortness of breath.  Historical Provider, MD  diphenhydrAMINE (BENADRYL) 25 MG tablet Take 25 mg by mouth at bedtime.    Historical Provider, MD  docusate sodium (COLACE) 100 MG capsule Take 1 capsule (100 mg total) by mouth 2 (two) times daily. Patient not taking: Reported on 04/29/2015 11/09/14   Chriss Czar, PA-C  doxycycline (VIBRAMYCIN) 100 MG capsule Take 1 capsule (100 mg total) by mouth 2 (two) times daily. Patient not taking: Reported on 04/29/2015 04/03/15   Lily Kocher, PA-C  enoxaparin (LOVENOX) 30  MG/0.3ML injection Inject 0.3 mLs (30 mg total) into the skin every 12 (twelve) hours. Patient not taking: Reported on 04/29/2015 11/07/14   Chriss Czar, PA-C  HYDROcodone-acetaminophen (NORCO/VICODIN) 5-325 MG tablet Take 1 tablet by mouth every 4 (four) hours as needed. Patient not taking: Reported on 04/29/2015 04/03/15   Lily Kocher, PA-C  oxyCODONE-acetaminophen (PERCOCET) 10-325 MG per tablet Take 1 tablet by mouth every 4 (four) hours as needed for pain. Patient not taking: Reported on 04/29/2015 11/07/14   Chriss Czar, PA-C  pantoprazole (PROTONIX) 20 MG tablet Take 1 tablet (20 mg total) by mouth daily. 04/29/15   Evalee Jefferson, PA-C  tamsulosin (FLOMAX) 0.4 MG CAPS capsule Take 1 capsule (0.4 mg total) by mouth daily after lunch. Patient not taking: Reported on 04/29/2015 11/10/14   Chriss Czar, PA-C   BP 131/93 mmHg  Pulse 52  Temp(Src) 98.4 F (36.9 C) (Oral)  Resp 11  Ht 5\' 11"  (1.803 m)  Wt 106.595 kg  BMI 32.79 kg/m2  SpO2 100% Physical Exam  Constitutional: He appears well-developed and well-nourished.  HENT:  Head: Normocephalic and atraumatic.  Eyes: Conjunctivae are normal.  Neck: Normal range of motion.  Cardiovascular: Normal rate, regular rhythm, normal heart sounds and intact distal pulses.   Pulmonary/Chest: Effort normal and breath sounds normal. He has no wheezes. He exhibits no tenderness.  Abdominal: Soft. Bowel sounds are normal. There is no tenderness. There is no guarding.  Musculoskeletal: Normal range of motion. He exhibits no edema or tenderness.  Neurological: He is alert.  Skin: Skin is warm and dry.  Psychiatric: He has a normal mood and affect.  Nursing note and vitals reviewed.   ED Course  Procedures (including critical care time) Labs Review Labs Reviewed  CBC  COMPREHENSIVE METABOLIC PANEL  TROPONIN I  TROPONIN I  D-DIMER, QUANTITATIVE (NOT AT [facility])    Imaging Review Dg Chest 2 View  04/29/2015  CLINICAL DATA:  Shortness of  breath, generalized chest tightness and nonproductive cough beginning 4 hours ago. EXAM: CHEST  2 VIEW COMPARISON:  10/27/2014 FINDINGS: Heart and mediastinal contours are within normal limits. No focal opacities or effusions. No acute bony abnormality. IMPRESSION: No active cardiopulmonary disease. Electronically Signed   By: Rolm Baptise M.D.   On: 04/29/2015 17:12   I have personally reviewed and evaluated these images and lab results as part of my medical decision-making.   EKG Interpretation   Date/Time:  Wednesday April 29 2015 16:10:54 EST Ventricular Rate:  70 PR Interval:  134 QRS Duration: 82 QT Interval:  357 QTC Calculation: 385 R Axis:   43 Text Interpretation:  Sinus rhythm Borderline T wave abnormalities  Nonspecific T wave abnormality Confirmed by Wyvonnia Dusky  MD, STEPHEN [id number])  on 04/29/2015 4:16:49 PM      MDM   Final diagnoses:  Chest pain, unspecified chest pain type    Patients  labs reviewed.  Radiological studies were viewed, interpreted and considered during the medical decision making and disposition  process. I agree with radiologists reading.  Results were also discussed with patient. Pt endorses he had an exercise treadmill test in his pcp's office one month ago without findings.   Pt was referred to [facility] cardiology here in [place] for further eval of sx.  No acute findings tonight, atypical chest pain sx, mild ttp right chest wall, but vague.  Pt prescribed protonix also, in event this is GERD as sx started after eating hot wings.    Pt was seen by Dr. Wyvonnia Dusky during this visit.  Heart score 3 - low risk.  Outpatient f/u appropriate.   Evalee Jefferson, PA-C 04/30/15 [id number]  Ezequiel Essex, MD 04/30/15 [phone]

## 2015-05-15 DIAGNOSIS — M2550 Pain in unspecified joint: Secondary | ICD-10-CM | POA: Diagnosis not present

## 2015-05-15 DIAGNOSIS — I110 Hypertensive heart disease with heart failure: Secondary | ICD-10-CM | POA: Diagnosis not present

## 2015-05-15 DIAGNOSIS — M545 Low back pain: Secondary | ICD-10-CM | POA: Diagnosis not present

## 2015-05-15 DIAGNOSIS — E784 Other hyperlipidemia: Secondary | ICD-10-CM | POA: Diagnosis not present

## 2015-05-28 DIAGNOSIS — M1612 Unilateral primary osteoarthritis, left hip: Secondary | ICD-10-CM | POA: Diagnosis not present

## 2015-06-03 NOTE — Therapy (Signed)
[facility] [place], [place], [address] Phone: [phone]   Fax:  [phone]  Physical Therapy Treatment  Patient Details  Name: Brandon Ferrell MRN: [id number] Date of Birth: 1955-06-11 No Data Recorded  Encounter Date: 12/15/2014    Past Medical History  Diagnosis Date  . Palpitations   . Hypertension   . COPD (chronic obstructive pulmonary disease) ([place])   . Pneumonia     hx  . GERD (gastroesophageal reflux disease)     occ zantac  . Arthritis     Past Surgical History  Procedure Laterality Date  . Lung biopsy  13    pneum, dx copd  . Vasectomy  5/15  . 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  Visit Diagnosis:  Weakness of right hip  Right hip pain                                 PT Short Term Goals - 12/02/14 1218    PT SHORT TERM GOAL #1   Title Ind with HEP.   Time 2   Period Weeks   Status Achieved           PT Long Term Goals - 12/02/14 1219    PT LONG TERM GOAL #1   Title Walk without a Trendelenburg gait pattern.   Time 6   Period Weeks   Status On-going   PT LONG TERM GOAL #2   Title Perform ADL's with right hip pain not > 3/10.   Status On-going               Problem List Patient Active Problem List   Diagnosis Date Noted  . Primary localized osteoarthritis of right hip 11/07/2014  . HYPERLIPIDEMIA 06/21/2006  . TOBACCO ABUSE 06/21/2006  . PRESBYOPIA 06/21/2006  . HYPERTENSION 06/21/2006  . ABSCESS, GROIN 06/21/2006  . INSOMNIA 06/21/2006   PHYSICAL THERAPY DISCHARGE SUMMARY  Visits from Start of Care:   Current functional level related to goals / functional  outcomes: Please see above.   Remaining deficits: Gait deviation.   Education / Equipment: HEP.  Plan: Patient agrees to discharge.  Patient goals were not met. Patient is being discharged due to not returning since the last visit.  ?????      Tylik Treese, [place] MPT 06/03/2015, 5:13 PM  [facility] [address] [place], [place], [address] Phone: [phone]   Fax:  [phone]  Name: PISTOL KESSENICH MRN: [id number] Date of Birth: 09-12-1955

## 2015-06-12 DIAGNOSIS — J45909 Unspecified asthma, uncomplicated: Secondary | ICD-10-CM | POA: Diagnosis not present

## 2015-06-12 DIAGNOSIS — I110 Hypertensive heart disease with heart failure: Secondary | ICD-10-CM | POA: Diagnosis not present

## 2015-06-12 DIAGNOSIS — M545 Low back pain: Secondary | ICD-10-CM | POA: Diagnosis not present

## 2015-06-12 DIAGNOSIS — M2550 Pain in unspecified joint: Secondary | ICD-10-CM | POA: Diagnosis not present

## 2015-06-12 DIAGNOSIS — M7062 Trochanteric bursitis, left hip: Secondary | ICD-10-CM | POA: Diagnosis not present

## 2015-06-19 DIAGNOSIS — I110 Hypertensive heart disease with heart failure: Secondary | ICD-10-CM | POA: Diagnosis not present

## 2015-06-19 DIAGNOSIS — R5383 Other fatigue: Secondary | ICD-10-CM | POA: Diagnosis not present

## 2015-06-19 DIAGNOSIS — R972 Elevated prostate specific antigen [PSA]: Secondary | ICD-10-CM | POA: Diagnosis not present

## 2015-06-19 DIAGNOSIS — D510 Vitamin B12 deficiency anemia due to intrinsic factor deficiency: Secondary | ICD-10-CM | POA: Diagnosis not present

## 2015-06-19 DIAGNOSIS — R784 Finding of other drugs of addictive potential in blood: Secondary | ICD-10-CM | POA: Diagnosis not present

## 2015-06-23 DIAGNOSIS — M2550 Pain in unspecified joint: Secondary | ICD-10-CM | POA: Diagnosis not present

## 2015-06-23 DIAGNOSIS — M1612 Unilateral primary osteoarthritis, left hip: Secondary | ICD-10-CM | POA: Diagnosis not present

## 2015-06-23 DIAGNOSIS — M7062 Trochanteric bursitis, left hip: Secondary | ICD-10-CM | POA: Diagnosis not present

## 2015-06-23 DIAGNOSIS — R5382 Chronic fatigue, unspecified: Secondary | ICD-10-CM | POA: Diagnosis not present

## 2015-06-23 DIAGNOSIS — I110 Hypertensive heart disease with heart failure: Secondary | ICD-10-CM | POA: Diagnosis not present

## 2015-06-24 DIAGNOSIS — L0291 Cutaneous abscess, unspecified: Secondary | ICD-10-CM | POA: Diagnosis not present

## 2015-06-26 ENCOUNTER — Ambulatory Visit: Payer: Self-pay | Admitting: Physician Assistant

## 2015-06-26 NOTE — H&P (Signed)
TOTAL HIP ADMISSION H&P  Patient is admitted for left total hip arthroplasty.  Subjective:  Chief Complaint: left hip pain  HPI: Brandon Ferrell, 60 y.o. 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  Onset of symptoms was gradual starting 8 years ago with gradually worsening course since that time.The patient noted no past surgery on the left hip(s).  Patient currently rates pain in the left hip at 10 out of 10 with activity. Patient has night pain, worsening of pain with activity and weight bearing, pain that interfers with activities of daily living and pain with passive range of motion. Patient has evidence of periarticular osteophytes and joint space narrowing by imaging studies. This condition presents safety issues increasing the risk of falls. There is no current active infection.  Patient Active Problem List   Diagnosis Date Noted  . Primary localized osteoarthritis of right hip 11/07/2014  . HYPERLIPIDEMIA 06/21/2006  . TOBACCO ABUSE 06/21/2006  . PRESBYOPIA 06/21/2006  . HYPERTENSION 06/21/2006  . ABSCESS, GROIN 06/21/2006  . INSOMNIA 06/21/2006   Past Medical History  Diagnosis Date  . Palpitations   . Hypertension   . COPD (chronic obstructive pulmonary disease) ([place])   . Pneumonia     hx  . GERD (gastroesophageal reflux disease)     occ zantac  . Arthritis     Past Surgical History  Procedure Laterality Date  . Lung biopsy  13    pneum, dx copd  . Vasectomy  5/15  . 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 Total hip arthroplasty Right 11/07/2014    Procedure: RIGHT  TOTAL HIP ARTHROPLASTY;  Surgeon: Earlie Server, MD;  Location: [place];  Service: Orthopedics;  Laterality: Right;  Would like to flip if possible       (Not in a hospital admission) Allergies  Allergen Reactions  . Bee Venom Anaphylaxis  . Shellfish Allergy Anaphylaxis and Hives    Lobester, Spider Bites    Social History  Substance Use Topics  . Smoking status: Current Every Day Smoker -- 1.00 packs/day for 40 years    Types: Cigarettes  . Smokeless tobacco: Never Used  . Alcohol Use: No    No family history on file.   Review of Systems  HENT: Positive for hearing loss.   Respiratory: Positive for shortness of breath.   Cardiovascular: Positive for palpitations.  Gastrointestinal: Positive for constipation, blood in stool and melena.  Musculoskeletal: Positive for joint pain.  Neurological: Positive for headaches.  All other systems reviewed and are negative.   Objective:  Physical Exam  Constitutional: He is oriented to person, place, and time. He appears well-developed and well-nourished. No distress.  HENT:  Head: Normocephalic and atraumatic.  Nose: Nose normal.  Eyes: Conjunctivae and EOM are normal. Pupils are equal, round, and reactive to light.  Neck: Normal range of motion. Neck supple.  Cardiovascular: Normal rate, regular rhythm, normal heart sounds and intact distal pulses.   Respiratory: Effort normal and breath sounds normal. No respiratory distress. He has no wheezes.  GI: Soft. Bowel sounds are normal. He exhibits no distension. There is no tenderness.  Musculoskeletal:       Left hip: He exhibits decreased range of motion and tenderness.  Lymphadenopathy:    He has no cervical adenopathy.  Neurological: He is alert and oriented to person, place, and time. No cranial nerve deficit.  Skin: Skin is warm and dry.     Psychiatric: He has a normal mood and affect. His behavior is normal.    Vital signs in last 24 hours: @VSRANGES @  Labs:   Estimated body mass index is 32.79 kg/(m^2) as calculated from the following:   Height as of 04/29/15: 5\' 11"  (1.803 m).   Weight as of 04/29/15: 106.595  kg (235 lb).   Imaging Review Plain radiographs demonstrate moderate degenerative joint disease of the left hip(s). The bone quality appears to be good for age and reported activity level.  Assessment/Plan:  End stage arthritis, left hip(s)  The patient history, physical examination, clinical judgement of the provider and imaging studies are consistent with end stage degenerative joint disease of the left hip(s) and total hip arthroplasty is deemed medically necessary. The treatment options including medical management, injection therapy, arthroscopy and arthroplasty were discussed at length. The risks and benefits of total hip arthroplasty were presented and reviewed. The risks due to aseptic loosening, infection, stiffness, dislocation/subluxation,  thromboembolic complications and other imponderables were discussed.  The patient acknowledged the explanation, agreed to proceed with the plan and consent was signed. Patient is being admitted for inpatient treatment for surgery, pain control, PT, OT, prophylactic antibiotics, VTE prophylaxis, progressive ambulation and ADL's and discharge planning.The patient is planning to be discharged home with home health services

## 2015-07-02 DIAGNOSIS — L729 Follicular cyst of the skin and subcutaneous tissue, unspecified: Secondary | ICD-10-CM | POA: Diagnosis not present

## 2015-07-03 DIAGNOSIS — L720 Epidermal cyst: Secondary | ICD-10-CM | POA: Diagnosis not present

## 2015-07-03 DIAGNOSIS — D235 Other benign neoplasm of skin of trunk: Secondary | ICD-10-CM | POA: Diagnosis not present

## 2015-07-03 DIAGNOSIS — L729 Follicular cyst of the skin and subcutaneous tissue, unspecified: Secondary | ICD-10-CM | POA: Diagnosis not present

## 2015-07-03 HISTORY — PX: GROIN EXPLORATION: SHX1713

## 2015-07-06 ENCOUNTER — Encounter (HOSPITAL_COMMUNITY): Payer: Self-pay

## 2015-07-06 ENCOUNTER — Encounter (HOSPITAL_COMMUNITY)
Admission: RE | Admit: 2015-07-06 | Discharge: 2015-07-06 | Disposition: A | Discharge status: Home / Self Care | Payer: Medicare Other | Source: Ambulatory Visit | Attending: Orthopedic Surgery | Admitting: Orthopedic Surgery

## 2015-07-06 ENCOUNTER — Other Ambulatory Visit (HOSPITAL_COMMUNITY): Payer: Self-pay | Admitting: *Deleted

## 2015-07-06 DIAGNOSIS — M1612 Unilateral primary osteoarthritis, left hip: Secondary | ICD-10-CM | POA: Insufficient documentation

## 2015-07-06 DIAGNOSIS — Z0183 Encounter for blood typing: Secondary | ICD-10-CM | POA: Diagnosis not present

## 2015-07-06 DIAGNOSIS — Z01812 Encounter for preprocedural laboratory examination: Secondary | ICD-10-CM | POA: Diagnosis not present

## 2015-07-06 LAB — PROTIME-INR
INR: 1.04 (ref 0.00–1.49)
Prothrombin Time: 13.8 seconds (ref 11.6–15.2)

## 2015-07-06 LAB — COMPREHENSIVE METABOLIC PANEL
ALT: 30 U/L (ref 17–63)
AST: 35 U/L (ref 15–41)
Albumin: 3.7 g/dL (ref 3.5–5.0)
Alkaline Phosphatase: 70 U/L (ref 38–126)
Anion gap: 10 (ref 5–15)
BILIRUBIN TOTAL: 0.6 mg/dL (ref 0.3–1.2)
BUN: 15 mg/dL (ref 6–20)
CHLORIDE: 105 mmol/L (ref 101–111)
CO2: 24 mmol/L (ref 22–32)
Calcium: 9.7 mg/dL (ref 8.9–10.3)
Creatinine, Ser: 1.17 mg/dL (ref 0.61–1.24)
GFR calc Af Amer: >60 mL/min (ref >60)
GFR calc non Af Amer: >60 mL/min (ref >60)
GLUCOSE: 102 mg/dL — AB (ref 65–99)
POTASSIUM: 4.5 mmol/L (ref 3.5–5.1)
SODIUM: 139 mmol/L (ref 135–145)
Total Protein: 7 g/dL (ref 6.5–8.1)

## 2015-07-06 LAB — CBC WITH DIFFERENTIAL/PLATELET
BASOS ABS: 0 10*3/uL (ref 0.0–0.1)
Basophils Relative: 0 %
EOS ABS: 0.9 10*3/uL — AB (ref 0.0–0.7)
EOS PCT: 9 %
HCT: 42.4 % (ref 39.0–52.0)
HEMOGLOBIN: 15.1 g/dL (ref 13.0–17.0)
LYMPHS ABS: 3.8 10*3/uL (ref 0.7–4.0)
LYMPHS PCT: 36 %
MCH: 31.3 pg (ref 26.0–34.0)
MCHC: 35.6 g/dL (ref 30.0–36.0)
MCV: 87.8 fL (ref 78.0–100.0)
Monocytes Absolute: 0.8 10*3/uL (ref 0.1–1.0)
Monocytes Relative: 7 %
NEUTROS PCT: 48 %
Neutro Abs: 5 10*3/uL (ref 1.7–7.7)
PLATELETS: 183 10*3/uL (ref 150–400)
RBC: 4.83 MIL/uL (ref 4.22–5.81)
RDW: 13.8 % (ref 11.5–15.5)
WBC: 10.5 10*3/uL (ref 4.0–10.5)

## 2015-07-06 LAB — SURGICAL PCR SCREEN
MRSA, PCR: NEGATIVE
Staphylococcus aureus: NEGATIVE

## 2015-07-06 LAB — URINALYSIS, ROUTINE W REFLEX MICROSCOPIC
BILIRUBIN URINE: NEGATIVE
Glucose, UA: NEGATIVE mg/dL
Hgb urine dipstick: NEGATIVE
KETONES UR: NEGATIVE mg/dL
Leukocytes, UA: NEGATIVE
NITRITE: NEGATIVE
Protein, ur: NEGATIVE mg/dL
Specific Gravity, Urine: 1.015 (ref 1.005–1.030)
pH: 5 (ref 5.0–8.0)

## 2015-07-06 LAB — TYPE AND SCREEN
ABO/RH(D): A POS
ANTIBODY SCREEN: NEGATIVE

## 2015-07-06 LAB — APTT: APTT: 26 s (ref 24–37)

## 2015-07-06 NOTE — Pre-Procedure Instructions (Addendum)
Brandon Ferrell  07/06/2015      [facility] - [place], [place] - [place] [address] Phone: [phone] Fax: [phone]    Your procedure is scheduled on Friday, July 17, 2015 at 7:30 AM.   Report to [facility] Entrance "A" Admitting Office at 5:30 AM.   Call this number if you have problems the morning of surgery: [phone]   Any questions prior to day of surgery, please call [phone] between 8 & 4 PM.   Remember:  Do not eat food or drink liquids after midnight Thursday, 07/16/15.  Take these medicines the morning of surgery with A SIP OF WATER: Amlodipine (Norvasc), Symbicort inhaler, Oxycodone - if needed, Albuterol inhaler - if needed. Bring inhaler with you day of surgery.   STOP all herbel meds, nsaids (aleve,naproxen,advil,ibuprofen) 5 days prior to surgery starting 07/12/15 including aspirin,vitamins   Do not wear jewelry.  Do not wear lotions, powders, or cologne.  You may wear deodorant.  Men may shave face and neck.  Do not bring valuables to the hospital.  [facility] is not responsible for any belongings or valuables.  Contacts, dentures or bridgework may not be worn into surgery.  Leave your suitcase in the car.  After surgery it may be brought to your room.  For patients admitted to the hospital, discharge time will be determined by your treatment team.  Special instructions:  [facility] - Preparing for Surgery  Before surgery, you can play an important role.  Because skin is not sterile, your skin needs to be as free of germs as possible.  You can reduce the number of germs on you skin by washing with CHG (chlorahexidine gluconate) soap before surgery.  CHG is an antiseptic cleaner which kills germs and bonds with the skin to continue killing germs even after washing.  Please DO NOT use if you have an allergy to CHG or antibacterial soaps.  If your skin becomes reddened/irritated stop using the CHG and inform your  nurse when you arrive at Short Stay.  Do not shave (including legs and underarms) for at least 48 hours prior to the first CHG shower.  You may shave your face.  Please follow these instructions carefully:   1.  Shower with CHG Soap the night before surgery and the                                morning of Surgery.  2.  If you choose to wash your hair, wash your hair first as usual with your       normal shampoo.  3.  After you shampoo, rinse your hair and body thoroughly to remove the                      Shampoo.  4.  Use CHG as you would any other liquid soap.  You can apply chg directly       to the skin and wash gently with scrungie or a clean washcloth.  5.  Apply the CHG Soap to your body ONLY FROM THE NECK DOWN.        Do not use on open wounds or open sores.  Avoid contact with your eyes, ears, mouth and genitals (private parts).  Wash genitals (private parts) with your normal soap.  6.  Wash thoroughly, paying special attention to the  area where your surgery        will be performed.  7.  Thoroughly rinse your body with warm water from the neck down.  8.  DO NOT shower/wash with your normal soap after using and rinsing off       the CHG Soap.  9.  Pat yourself dry with a clean towel.            10.  Wear clean pajamas.            11.  Place clean sheets on your bed the night of your first shower and do not        sleep with pets.  Day of Surgery  Do not apply any lotions the morning of surgery.  Please wear clean clothes to the hospital.   Please read over the following fact sheets that you were given. Pain Booklet, Coughing and Deep Breathing, Blood Transfusion Information, MRSA Information and Surgical Site Infection Prevention

## 2015-07-07 LAB — URINE CULTURE: Culture: NO GROWTH

## 2015-07-13 DIAGNOSIS — M2550 Pain in unspecified joint: Secondary | ICD-10-CM | POA: Diagnosis not present

## 2015-07-13 DIAGNOSIS — R5382 Chronic fatigue, unspecified: Secondary | ICD-10-CM | POA: Diagnosis not present

## 2015-07-13 DIAGNOSIS — M545 Low back pain: Secondary | ICD-10-CM | POA: Diagnosis not present

## 2015-07-16 MED ORDER — SODIUM CHLORIDE 0.9 % IV SOLN
1000.0000 mg | INTRAVENOUS | Status: AC
  Administered 2015-07-17: 1000 mg via INTRAVENOUS
  Filled 2015-07-16: qty 10

## 2015-07-16 MED ORDER — CEFAZOLIN SODIUM-DEXTROSE 2-4 GM/100ML-% IV SOLN
2.0000 g | INTRAVENOUS | Status: AC
  Administered 2015-07-17: 2 g via INTRAVENOUS
  Filled 2015-07-16: qty 100

## 2015-07-16 MED ORDER — SODIUM CHLORIDE 0.9 % IV SOLN: INTRAVENOUS | Status: DC

## 2015-07-16 NOTE — Anesthesia Preprocedure Evaluation (Addendum)
Anesthesia Evaluation  Patient identified by MRN, date of birth, ID band Patient awake    Reviewed: Allergy & Precautions, NPO status , Patient's Chart, lab work & pertinent test results  History of Anesthesia Complications Negative for: history of anesthetic complications  Airway Mallampati: I  TM Distance: >3 FB Neck ROM: Full    Dental  (+) Edentulous Upper, Edentulous Lower   Pulmonary COPD,  COPD inhaler, Current Smoker,    breath sounds clear to auscultation- rhonchi       Cardiovascular hypertension, Pt. on medications (-) angina Rhythm:Regular Rate:Normal  '07 ECHO: EF 55-60%, valves OK   Neuro/Psych negative neurological ROS     GI/Hepatic Neg liver ROS, GERD  Medicated and Controlled,  Endo/Other  Morbid obesity  Renal/GU negative Renal ROS     Musculoskeletal  (+) Arthritis , Osteoarthritis,    Abdominal (+) + obese,  Abdomen: soft.    Peds  Hematology negative hematology ROS (+)   Anesthesia Other Findings   Reproductive/Obstetrics                          Anesthesia Physical Anesthesia Plan  ASA: II  Anesthesia Plan: General   Post-op Pain Management:    Induction: Intravenous  Airway Management Planned: Oral ETT  Additional Equipment:   Intra-op Plan:   Post-operative Plan: Extubation in OR  Informed Consent: I have reviewed the patients History and Physical, chart, labs and discussed the procedure including the risks, benefits and alternatives for the proposed anesthesia with the patient or authorized representative who has indicated his/her understanding and acceptance.   Dental advisory given  Plan Discussed with: Anesthesiologist, Surgeon and CRNA  Anesthesia Plan Comments: (Plan routine monitors, GETA)       Anesthesia Quick Evaluation

## 2015-07-17 ENCOUNTER — Inpatient Hospital Stay (HOSPITAL_COMMUNITY): Payer: Medicare Other

## 2015-07-17 ENCOUNTER — Inpatient Hospital Stay (HOSPITAL_COMMUNITY): Payer: Medicare Other | Admitting: Anesthesiology

## 2015-07-17 ENCOUNTER — Encounter (HOSPITAL_COMMUNITY)
Admission: RE | Disposition: A | Discharge status: Home / Self Care | Payer: Self-pay | Source: Ambulatory Visit | Attending: Orthopedic Surgery

## 2015-07-17 ENCOUNTER — Inpatient Hospital Stay (HOSPITAL_COMMUNITY)
Admission: RE | Admit: 2015-07-17 | Discharge: 2015-07-20 | DRG: 470 | Disposition: A | Discharge status: Home / Self Care | Payer: Medicare Other | Source: Ambulatory Visit | Attending: Orthopedic Surgery | Admitting: Orthopedic Surgery

## 2015-07-17 DIAGNOSIS — H9190 Unspecified hearing loss, unspecified ear: Secondary | ICD-10-CM | POA: Diagnosis present

## 2015-07-17 DIAGNOSIS — Z91013 Allergy to seafood: Secondary | ICD-10-CM | POA: Diagnosis not present

## 2015-07-17 DIAGNOSIS — Z471 Aftercare following joint replacement surgery: Secondary | ICD-10-CM | POA: Diagnosis not present

## 2015-07-17 DIAGNOSIS — K219 Gastro-esophageal reflux disease without esophagitis: Secondary | ICD-10-CM | POA: Diagnosis not present

## 2015-07-17 DIAGNOSIS — Z91030 Bee allergy status: Secondary | ICD-10-CM | POA: Diagnosis not present

## 2015-07-17 DIAGNOSIS — M879 Osteonecrosis, unspecified: Secondary | ICD-10-CM | POA: Diagnosis not present

## 2015-07-17 DIAGNOSIS — E785 Hyperlipidemia, unspecified: Secondary | ICD-10-CM | POA: Diagnosis not present

## 2015-07-17 DIAGNOSIS — K5900 Constipation, unspecified: Secondary | ICD-10-CM | POA: Diagnosis present

## 2015-07-17 DIAGNOSIS — G4700 Insomnia, unspecified: Secondary | ICD-10-CM | POA: Diagnosis present

## 2015-07-17 DIAGNOSIS — I10 Essential (primary) hypertension: Secondary | ICD-10-CM | POA: Diagnosis present

## 2015-07-17 DIAGNOSIS — J449 Chronic obstructive pulmonary disease, unspecified: Secondary | ICD-10-CM | POA: Diagnosis present

## 2015-07-17 DIAGNOSIS — Z6832 Body mass index (BMI) 32.0-32.9, adult: Secondary | ICD-10-CM

## 2015-07-17 DIAGNOSIS — R339 Retention of urine, unspecified: Secondary | ICD-10-CM | POA: Diagnosis not present

## 2015-07-17 DIAGNOSIS — Z96641 Presence of right artificial hip joint: Secondary | ICD-10-CM | POA: Diagnosis not present

## 2015-07-17 DIAGNOSIS — M1612 Unilateral primary osteoarthritis, left hip: Secondary | ICD-10-CM | POA: Diagnosis not present

## 2015-07-17 DIAGNOSIS — M169 Osteoarthritis of hip, unspecified: Secondary | ICD-10-CM | POA: Diagnosis not present

## 2015-07-17 DIAGNOSIS — F17200 Nicotine dependence, unspecified, uncomplicated: Secondary | ICD-10-CM | POA: Diagnosis present

## 2015-07-17 DIAGNOSIS — F17210 Nicotine dependence, cigarettes, uncomplicated: Secondary | ICD-10-CM | POA: Diagnosis not present

## 2015-07-17 DIAGNOSIS — R410 Disorientation, unspecified: Secondary | ICD-10-CM | POA: Diagnosis not present

## 2015-07-17 DIAGNOSIS — N289 Disorder of kidney and ureter, unspecified: Secondary | ICD-10-CM | POA: Diagnosis not present

## 2015-07-17 DIAGNOSIS — M1611 Unilateral primary osteoarthritis, right hip: Secondary | ICD-10-CM | POA: Diagnosis present

## 2015-07-17 DIAGNOSIS — Z96642 Presence of left artificial hip joint: Secondary | ICD-10-CM | POA: Diagnosis not present

## 2015-07-17 DIAGNOSIS — R140 Abdominal distension (gaseous): Secondary | ICD-10-CM

## 2015-07-17 DIAGNOSIS — R338 Other retention of urine: Secondary | ICD-10-CM | POA: Diagnosis not present

## 2015-07-17 HISTORY — PX: TOTAL HIP ARTHROPLASTY: SHX124

## 2015-07-17 HISTORY — DX: Disorder of kidney and ureter, unspecified: N28.9

## 2015-07-17 HISTORY — DX: Other retention of urine: R33.8

## 2015-07-17 LAB — CBC
HCT: 37.2 % — ABNORMAL LOW (ref 39.0–52.0)
HEMOGLOBIN: 12.5 g/dL — AB (ref 13.0–17.0)
MCH: 29.8 pg (ref 26.0–34.0)
MCHC: 33.6 g/dL (ref 30.0–36.0)
MCV: 88.6 fL (ref 78.0–100.0)
Platelets: 172 10*3/uL (ref 150–400)
RBC: 4.2 MIL/uL — AB (ref 4.22–5.81)
RDW: 14 % (ref 11.5–15.5)
WBC: 12.4 10*3/uL — ABNORMAL HIGH (ref 4.0–10.5)

## 2015-07-17 LAB — CREATININE, SERUM
CREATININE: 1.33 mg/dL — AB (ref 0.61–1.24)
GFR calc Af Amer: >60 mL/min (ref >60)
GFR, EST NON AFRICAN AMERICAN: 57 mL/min — AB (ref >60)

## 2015-07-17 SURGERY — ARTHROPLASTY, HIP, TOTAL,POSTERIOR APPROACH
Anesthesia: General | Laterality: Left

## 2015-07-17 MED ORDER — CEFAZOLIN SODIUM-DEXTROSE 2-4 GM/100ML-% IV SOLN
2.0000 g | Freq: Four times a day (QID) | INTRAVENOUS | Status: AC
  Administered 2015-07-17 (×2): 2 g via INTRAVENOUS
  Filled 2015-07-17 (×2): qty 100

## 2015-07-17 MED ORDER — OXYCODONE HCL 5 MG PO TABS
ORAL_TABLET | ORAL | Status: AC
  Filled 2015-07-17: qty 2

## 2015-07-17 MED ORDER — MEPERIDINE HCL 25 MG/ML IJ SOLN: 6.2500 mg | INTRAMUSCULAR | Status: DC | PRN

## 2015-07-17 MED ORDER — ENOXAPARIN SODIUM 30 MG/0.3ML ~~LOC~~ SOLN
30.0000 mg | Freq: Two times a day (BID) | SUBCUTANEOUS | Status: DC
  Administered 2015-07-18 – 2015-07-20 (×5): 30 mg via SUBCUTANEOUS
  Filled 2015-07-17 (×5): qty 0.3

## 2015-07-17 MED ORDER — METOCLOPRAMIDE HCL 5 MG PO TABS: 5.0000 mg | ORAL_TABLET | Freq: Three times a day (TID) | ORAL | Status: DC | PRN

## 2015-07-17 MED ORDER — FENTANYL CITRATE (PF) 100 MCG/2ML IJ SOLN
INTRAMUSCULAR | Status: DC | PRN
  Administered 2015-07-17 (×3): 50 ug via INTRAVENOUS
  Administered 2015-07-17: 250 ug via INTRAVENOUS
  Administered 2015-07-17 (×2): 50 ug via INTRAVENOUS

## 2015-07-17 MED ORDER — SODIUM CHLORIDE 0.9 % IJ SOLN
INTRAMUSCULAR | Status: AC
  Filled 2015-07-17: qty 10

## 2015-07-17 MED ORDER — MIDAZOLAM HCL 2 MG/2ML IJ SOLN
0.5000 mg | Freq: Once | INTRAMUSCULAR | Status: AC | PRN
  Administered 2015-07-17: 1.25 mg via INTRAVENOUS

## 2015-07-17 MED ORDER — SUGAMMADEX SODIUM 200 MG/2ML IV SOLN
INTRAVENOUS | Status: DC | PRN
  Administered 2015-07-17: 200 mg via INTRAVENOUS

## 2015-07-17 MED ORDER — ONDANSETRON HCL 4 MG/2ML IJ SOLN: 4.0000 mg | Freq: Four times a day (QID) | INTRAMUSCULAR | Status: DC | PRN

## 2015-07-17 MED ORDER — DIPHENHYDRAMINE HCL 25 MG PO CAPS
25.0000 mg | ORAL_CAPSULE | Freq: Every day | ORAL | Status: DC
  Administered 2015-07-17 – 2015-07-19 (×3): 25 mg via ORAL
  Filled 2015-07-17 (×3): qty 1

## 2015-07-17 MED ORDER — CHLORHEXIDINE GLUCONATE 4 % EX LIQD
60.0000 mL | Freq: Once | CUTANEOUS | Status: AC
  Administered 2015-07-17: 4 via TOPICAL
  Filled 2015-07-17: qty 60

## 2015-07-17 MED ORDER — OXYCODONE HCL ER 10 MG PO T12A
10.0000 mg | EXTENDED_RELEASE_TABLET | Freq: Two times a day (BID) | ORAL | Status: DC
  Administered 2015-07-17 – 2015-07-20 (×6): 10 mg via ORAL
  Filled 2015-07-17 (×6): qty 1

## 2015-07-17 MED ORDER — PHENOL 1.4 % MT LIQD: 1.0000 | OROMUCOSAL | Status: DC | PRN

## 2015-07-17 MED ORDER — METHOCARBAMOL 750 MG PO TABS
750.0000 mg | ORAL_TABLET | Freq: Four times a day (QID) | ORAL | Status: DC | PRN
  Administered 2015-07-17 – 2015-07-20 (×10): 750 mg via ORAL
  Filled 2015-07-17 (×9): qty 1

## 2015-07-17 MED ORDER — BISACODYL 10 MG RE SUPP
10.0000 mg | Freq: Every day | RECTAL | Status: DC | PRN
  Filled 2015-07-17: qty 1

## 2015-07-17 MED ORDER — ENOXAPARIN SODIUM 30 MG/0.3ML ~~LOC~~ SOLN: 30.0000 mg | Freq: Two times a day (BID) | SUBCUTANEOUS | Status: DC

## 2015-07-17 MED ORDER — ROCURONIUM BROMIDE 50 MG/5ML IV SOLN
INTRAVENOUS | Status: AC
  Filled 2015-07-17: qty 1

## 2015-07-17 MED ORDER — METHOCARBAMOL 500 MG PO TABS
ORAL_TABLET | ORAL | Status: AC
  Filled 2015-07-17: qty 1

## 2015-07-17 MED ORDER — OXYCODONE HCL 5 MG PO TABS
10.0000 mg | ORAL_TABLET | ORAL | Status: DC | PRN
  Administered 2015-07-17 (×3): 10 mg via ORAL
  Filled 2015-07-17 (×2): qty 2

## 2015-07-17 MED ORDER — POLYETHYLENE GLYCOL 3350 17 G PO PACK: 17.0000 g | PACK | Freq: Every day | ORAL | Status: DC | PRN

## 2015-07-17 MED ORDER — ZOLPIDEM TARTRATE 5 MG PO TABS: 5.0000 mg | ORAL_TABLET | Freq: Every evening | ORAL | Status: DC | PRN

## 2015-07-17 MED ORDER — METHOCARBAMOL 750 MG PO TABS: 750.0000 mg | ORAL_TABLET | Freq: Four times a day (QID) | ORAL | Status: DC | PRN

## 2015-07-17 MED ORDER — PROPOFOL 10 MG/ML IV BOLUS
INTRAVENOUS | Status: DC | PRN
  Administered 2015-07-17: 150 mg via INTRAVENOUS
  Administered 2015-07-17: 50 mg via INTRAVENOUS

## 2015-07-17 MED ORDER — HYDROMORPHONE HCL 1 MG/ML IJ SOLN
INTRAMUSCULAR | Status: AC
  Administered 2015-07-17: 1 mg
  Filled 2015-07-17: qty 2

## 2015-07-17 MED ORDER — ROCURONIUM BROMIDE 100 MG/10ML IV SOLN
INTRAVENOUS | Status: DC | PRN
  Administered 2015-07-17: 30 mg via INTRAVENOUS
  Administered 2015-07-17: 50 mg via INTRAVENOUS
  Administered 2015-07-17: 20 mg via INTRAVENOUS

## 2015-07-17 MED ORDER — METOCLOPRAMIDE HCL 5 MG/ML IJ SOLN: 5.0000 mg | Freq: Three times a day (TID) | INTRAMUSCULAR | Status: DC | PRN

## 2015-07-17 MED ORDER — BUPIVACAINE-EPINEPHRINE (PF) 0.5% -1:200000 IJ SOLN
INTRAMUSCULAR | Status: AC
  Filled 2015-07-17: qty 30

## 2015-07-17 MED ORDER — SODIUM CHLORIDE 0.9 % IR SOLN
Status: DC | PRN
  Administered 2015-07-17: 1000 mL

## 2015-07-17 MED ORDER — SUCCINYLCHOLINE CHLORIDE 20 MG/ML IJ SOLN
INTRAMUSCULAR | Status: AC
  Filled 2015-07-17: qty 1

## 2015-07-17 MED ORDER — DIPHENHYDRAMINE HCL 25 MG PO TABS
25.0000 mg | ORAL_TABLET | Freq: Every day | ORAL | Status: DC
  Filled 2015-07-17: qty 1

## 2015-07-17 MED ORDER — MIDAZOLAM HCL 2 MG/2ML IJ SOLN
INTRAMUSCULAR | Status: AC
  Filled 2015-07-17: qty 2

## 2015-07-17 MED ORDER — SODIUM CHLORIDE 0.9 % IV SOLN
INTRAVENOUS | Status: DC
  Administered 2015-07-17: 14:00:00 via INTRAVENOUS

## 2015-07-17 MED ORDER — LACTATED RINGERS IV SOLN
INTRAVENOUS | Status: DC | PRN
  Administered 2015-07-17: 07:00:00 via INTRAVENOUS

## 2015-07-17 MED ORDER — MIDAZOLAM HCL 5 MG/5ML IJ SOLN
INTRAMUSCULAR | Status: DC | PRN
  Administered 2015-07-17: 2 mg via INTRAVENOUS

## 2015-07-17 MED ORDER — OXYCODONE-ACETAMINOPHEN 10-325 MG PO TABS: 1.0000 | ORAL_TABLET | ORAL | Status: DC | PRN

## 2015-07-17 MED ORDER — FENTANYL CITRATE (PF) 250 MCG/5ML IJ SOLN
INTRAMUSCULAR | Status: AC
  Filled 2015-07-17: qty 5

## 2015-07-17 MED ORDER — HYDROMORPHONE HCL 1 MG/ML IJ SOLN
0.2500 mg | INTRAMUSCULAR | Status: DC | PRN
  Administered 2015-07-17 (×5): 0.5 mg via INTRAVENOUS

## 2015-07-17 MED ORDER — DIPHENHYDRAMINE HCL 25 MG PO CAPS: 25.0000 mg | ORAL_CAPSULE | Freq: Every evening | ORAL | Status: DC | PRN

## 2015-07-17 MED ORDER — SUGAMMADEX SODIUM 200 MG/2ML IV SOLN
INTRAVENOUS | Status: AC
  Filled 2015-07-17: qty 2

## 2015-07-17 MED ORDER — DOCUSATE SODIUM 100 MG PO CAPS
100.0000 mg | ORAL_CAPSULE | Freq: Two times a day (BID) | ORAL | Status: DC
  Administered 2015-07-17 – 2015-07-20 (×6): 100 mg via ORAL
  Filled 2015-07-17 (×7): qty 1

## 2015-07-17 MED ORDER — ALBUTEROL SULFATE (2.5 MG/3ML) 0.083% IN NEBU: 3.0000 mL | INHALATION_SOLUTION | Freq: Four times a day (QID) | RESPIRATORY_TRACT | Status: DC | PRN

## 2015-07-17 MED ORDER — AMLODIPINE BESYLATE 5 MG PO TABS
5.0000 mg | ORAL_TABLET | Freq: Every day | ORAL | Status: DC
  Administered 2015-07-17 – 2015-07-20 (×4): 5 mg via ORAL
  Filled 2015-07-17 (×4): qty 1

## 2015-07-17 MED ORDER — ONDANSETRON HCL 4 MG PO TABS: 4.0000 mg | ORAL_TABLET | Freq: Four times a day (QID) | ORAL | Status: DC | PRN

## 2015-07-17 MED ORDER — EPHEDRINE SULFATE 50 MG/ML IJ SOLN
INTRAMUSCULAR | Status: AC
  Filled 2015-07-17: qty 1

## 2015-07-17 MED ORDER — LIDOCAINE HCL (CARDIAC) 20 MG/ML IV SOLN
INTRAVENOUS | Status: AC
  Filled 2015-07-17: qty 5

## 2015-07-17 MED ORDER — ONDANSETRON HCL 4 MG/2ML IJ SOLN
INTRAMUSCULAR | Status: DC | PRN
  Administered 2015-07-17: 4 mg via INTRAVENOUS

## 2015-07-17 MED ORDER — TRANEXAMIC ACID 1000 MG/10ML IV SOLN
1000.0000 mg | Freq: Once | INTRAVENOUS | Status: AC
  Administered 2015-07-17: 1000 mg via INTRAVENOUS
  Filled 2015-07-17: qty 10

## 2015-07-17 MED ORDER — MENTHOL 3 MG MT LOZG: 1.0000 | LOZENGE | OROMUCOSAL | Status: DC | PRN

## 2015-07-17 MED ORDER — LIDOCAINE HCL (CARDIAC) 20 MG/ML IV SOLN
INTRAVENOUS | Status: DC | PRN
  Administered 2015-07-17: 20 mg via INTRAVENOUS

## 2015-07-17 MED ORDER — PHENYLEPHRINE 40 MCG/ML (10ML) SYRINGE FOR IV PUSH (FOR BLOOD PRESSURE SUPPORT)
PREFILLED_SYRINGE | INTRAVENOUS | Status: AC
  Filled 2015-07-17: qty 10

## 2015-07-17 MED ORDER — OXYCODONE HCL 5 MG PO TABS
5.0000 mg | ORAL_TABLET | ORAL | Status: DC | PRN
  Administered 2015-07-17 – 2015-07-20 (×9): 15 mg via ORAL
  Filled 2015-07-17 (×9): qty 3

## 2015-07-17 MED ORDER — ACETAMINOPHEN 325 MG PO TABS
650.0000 mg | ORAL_TABLET | Freq: Four times a day (QID) | ORAL | Status: DC | PRN
  Administered 2015-07-17 – 2015-07-19 (×3): 650 mg via ORAL
  Filled 2015-07-17 (×3): qty 2

## 2015-07-17 MED ORDER — METHOCARBAMOL 1000 MG/10ML IJ SOLN: 500.0000 mg | Freq: Four times a day (QID) | INTRAMUSCULAR | Status: DC | PRN

## 2015-07-17 MED ORDER — FLEET ENEMA 7-19 GM/118ML RE ENEM: 1.0000 | ENEMA | Freq: Once | RECTAL | Status: DC | PRN

## 2015-07-17 MED ORDER — PROPOFOL 10 MG/ML IV BOLUS
INTRAVENOUS | Status: AC
  Filled 2015-07-17: qty 40

## 2015-07-17 MED ORDER — ACETAMINOPHEN 650 MG RE SUPP: 650.0000 mg | Freq: Four times a day (QID) | RECTAL | Status: DC | PRN

## 2015-07-17 MED ORDER — BUPIVACAINE-EPINEPHRINE 0.5% -1:200000 IJ SOLN
INTRAMUSCULAR | Status: DC | PRN
  Administered 2015-07-17: 30 mL

## 2015-07-17 MED ORDER — CHLORHEXIDINE GLUCONATE 4 % EX LIQD
60.0000 mL | Freq: Once | CUTANEOUS | Status: AC
  Administered 2015-07-18: 4 via TOPICAL
  Filled 2015-07-17: qty 60

## 2015-07-17 MED ORDER — PRAVASTATIN SODIUM 40 MG PO TABS
80.0000 mg | ORAL_TABLET | Freq: Every day | ORAL | Status: DC
  Administered 2015-07-17 – 2015-07-20 (×4): 80 mg via ORAL
  Filled 2015-07-17 (×4): qty 2

## 2015-07-17 MED ORDER — ONDANSETRON HCL 4 MG/2ML IJ SOLN
INTRAMUSCULAR | Status: AC
  Filled 2015-07-17: qty 2

## 2015-07-17 MED ORDER — HYDROMORPHONE HCL 1 MG/ML IJ SOLN
1.0000 mg | INTRAMUSCULAR | Status: DC | PRN
  Administered 2015-07-17 – 2015-07-19 (×9): 1 mg via INTRAVENOUS
  Filled 2015-07-17 (×10): qty 1

## 2015-07-17 MED ORDER — EPHEDRINE SULFATE 50 MG/ML IJ SOLN
INTRAMUSCULAR | Status: DC | PRN
  Administered 2015-07-17 (×4): 10 mg via INTRAVENOUS

## 2015-07-17 MED ORDER — MOMETASONE FURO-FORMOTEROL FUM 200-5 MCG/ACT IN AERO
2.0000 | INHALATION_SPRAY | Freq: Two times a day (BID) | RESPIRATORY_TRACT | Status: DC
  Administered 2015-07-17 – 2015-07-20 (×6): 2 via RESPIRATORY_TRACT
  Filled 2015-07-17: qty 8.8

## 2015-07-17 MED ORDER — CHLORHEXIDINE GLUCONATE 4 % EX LIQD
60.0000 mL | Freq: Once | CUTANEOUS | Status: DC
  Filled 2015-07-17: qty 60

## 2015-07-17 SURGICAL SUPPLY — 57 items
BLADE SAW SAG 73X25 THK (BLADE) ×2
BLADE SAW SGTL 73X25 THK (BLADE) ×1 IMPLANT
BRUSH FEMORAL CANAL (MISCELLANEOUS) IMPLANT
CAPT HIP TOTAL 2 ×2 IMPLANT
COVER SURGICAL LIGHT HANDLE (MISCELLANEOUS) ×3 IMPLANT
DRAPE INCISE IOBAN 66X45 STRL (DRAPES) IMPLANT
DRAPE ORTHO SPLIT 77X108 STRL (DRAPES) ×6
DRAPE SURG ORHT 6 SPLT 77X108 (DRAPES) ×2 IMPLANT
DRAPE U-SHAPE 47X51 STRL (DRAPES) ×3 IMPLANT
DRSG ADAPTIC 3X8 NADH LF (GAUZE/BANDAGES/DRESSINGS) ×3 IMPLANT
DRSG AQUACEL AG ADV 3.5X14 (GAUZE/BANDAGES/DRESSINGS) ×2 IMPLANT
DRSG PAD ABDOMINAL 8X10 ST (GAUZE/BANDAGES/DRESSINGS) ×6 IMPLANT
DURAPREP 26ML APPLICATOR (WOUND CARE) ×3 IMPLANT
ELECT BLADE 6.5 EXT (BLADE) IMPLANT
ELECT CAUTERY BLADE 6.4 (BLADE) ×3 IMPLANT
ELECT REM PT RETURN 9FT ADLT (ELECTROSURGICAL) ×3
ELECTRODE REM PT RTRN 9FT ADLT (ELECTROSURGICAL) ×1 IMPLANT
FACESHIELD WRAPAROUND (MASK) ×6 IMPLANT
FACESHIELD WRAPAROUND OR TEAM (MASK) ×2 IMPLANT
GAUZE SPONGE 4X4 12PLY STRL (GAUZE/BANDAGES/DRESSINGS) ×3 IMPLANT
GLOVE BIOGEL PI IND STRL 8 (GLOVE) ×2 IMPLANT
GLOVE BIOGEL PI INDICATOR 8 (GLOVE) ×4
GLOVE ORTHO TXT STRL SZ7.5 (GLOVE) ×6 IMPLANT
GLOVE SURG ORTHO 8.0 STRL STRW (GLOVE) ×6 IMPLANT
GOWN STRL REUS W/ TWL LRG LVL3 (GOWN DISPOSABLE) ×1 IMPLANT
GOWN STRL REUS W/ TWL XL LVL3 (GOWN DISPOSABLE) ×1 IMPLANT
GOWN STRL REUS W/TWL 2XL LVL3 (GOWN DISPOSABLE) ×3 IMPLANT
GOWN STRL REUS W/TWL LRG LVL3 (GOWN DISPOSABLE) ×3
GOWN STRL REUS W/TWL XL LVL3 (GOWN DISPOSABLE) ×3
HANDPIECE INTERPULSE COAX TIP (DISPOSABLE) ×3
HOOD PEEL AWAY FACE SHEILD DIS (HOOD) ×3 IMPLANT
IMMOBILIZER KNEE 22 UNIV (SOFTGOODS) ×3 IMPLANT
KIT BASIN OR (CUSTOM PROCEDURE TRAY) ×3 IMPLANT
KIT ROOM TURNOVER OR (KITS) ×3 IMPLANT
MANIFOLD NEPTUNE II (INSTRUMENTS) ×3 IMPLANT
NDL MAYO TROCAR (NEEDLE) IMPLANT
NEEDLE 22X1 1/2 (OR ONLY) (NEEDLE) ×3 IMPLANT
NEEDLE MAYO TROCAR (NEEDLE) IMPLANT
NS IRRIG 1000ML POUR BTL (IV SOLUTION) ×5 IMPLANT
PACK TOTAL JOINT (CUSTOM PROCEDURE TRAY) ×3 IMPLANT
PACK UNIVERSAL I (CUSTOM PROCEDURE TRAY) ×3 IMPLANT
PAD ARMBOARD 7.5X6 YLW CONV (MISCELLANEOUS) ×6 IMPLANT
PRESSURIZER FEMORAL UNIV (MISCELLANEOUS) IMPLANT
SET HNDPC FAN SPRY TIP SCT (DISPOSABLE) IMPLANT
STAPLER VISISTAT 35W (STAPLE) ×3 IMPLANT
SUCTION FRAZIER HANDLE 10FR (MISCELLANEOUS) ×2
SUCTION TUBE FRAZIER 10FR DISP (MISCELLANEOUS) ×1 IMPLANT
SUT ETHIBOND 2 V 37 (SUTURE) ×5 IMPLANT
SUT VIC AB 0 CT1 27 (SUTURE) ×6
SUT VIC AB 0 CT1 27XBRD ANBCTR (SUTURE) ×1 IMPLANT
SUT VIC AB 2-0 CT1 27 (SUTURE) ×6
SUT VIC AB 2-0 CT1 TAPERPNT 27 (SUTURE) ×2 IMPLANT
SYR CONTROL 10ML LL (SYRINGE) ×3 IMPLANT
TOWEL OR 17X24 6PK STRL BLUE (TOWEL DISPOSABLE) ×3 IMPLANT
TOWEL OR 17X26 10 PK STRL BLUE (TOWEL DISPOSABLE) ×3 IMPLANT
TOWER CARTRIDGE SMART MIX (DISPOSABLE) IMPLANT
WATER STERILE IRR 1000ML POUR (IV SOLUTION) ×3 IMPLANT

## 2015-07-17 NOTE — Anesthesia Postprocedure Evaluation (Signed)
Anesthesia Post Note  Patient: Brandon Ferrell  Procedure(s) Performed: Procedure(s) (LRB): LEFT TOTAL HIP ARTHROPLASTY (Left)  Patient location during evaluation: PACU Anesthesia Type: General Level of consciousness: awake and alert, oriented and patient cooperative Pain management: pain level controlled Vital Signs Assessment: post-procedure vital signs reviewed and stable Respiratory status: spontaneous breathing, nonlabored ventilation, respiratory function stable and patient connected to nasal cannula oxygen Cardiovascular status: blood pressure returned to baseline and stable Postop Assessment: no signs of nausea or vomiting Anesthetic complications: no    Last Vitals:  Filed Vitals:   07/17/15 1130 07/17/15 1200  BP: 131/83 121/86  Pulse: 82   Temp:    Resp: 16     Last Pain:  Filed Vitals:   07/17/15 1221  PainSc: Asleep                 Ravindra Baranek,E. Nataline Basara

## 2015-07-17 NOTE — H&P (View-Only) (Signed)
TOTAL HIP ADMISSION H&P  Patient is admitted for left total hip arthroplasty.  Subjective:  Chief Complaint: left hip pain  HPI: Brandon Ferrell, 60 y.o. 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  Onset of symptoms was gradual starting 8 years ago with gradually worsening course since that time.The patient noted no past surgery on the left hip(s).  Patient currently rates pain in the left hip at 10 out of 10 with activity. Patient has night pain, worsening of pain with activity and weight bearing, pain that interfers with activities of daily living and pain with passive range of motion. Patient has evidence of periarticular osteophytes and joint space narrowing by imaging studies. This condition presents safety issues increasing the risk of falls. There is no current active infection.  Patient Active Problem List   Diagnosis Date Noted  . Primary localized osteoarthritis of right hip 11/07/2014  . HYPERLIPIDEMIA 06/21/2006  . TOBACCO ABUSE 06/21/2006  . PRESBYOPIA 06/21/2006  . HYPERTENSION 06/21/2006  . ABSCESS, GROIN 06/21/2006  . INSOMNIA 06/21/2006   Past Medical History  Diagnosis Date  . Palpitations   . Hypertension   . COPD (chronic obstructive pulmonary disease) ([place])   . Pneumonia     hx  . GERD (gastroesophageal reflux disease)     occ zantac  . Arthritis     Past Surgical History  Procedure Laterality Date  . Lung biopsy  13    pneum, dx copd  . Vasectomy  5/15  . 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 Total hip arthroplasty Right 11/07/2014    Procedure: RIGHT  TOTAL HIP ARTHROPLASTY;  Surgeon: Earlie Server, MD;  Location: [facility];  Service: Orthopedics;  Laterality: Right;  Would like to flip if possible       (Not in a hospital admission) Allergies  Allergen Reactions  . Bee Venom Anaphylaxis  . Shellfish Allergy Anaphylaxis and Hives    Lobester, Spider Bites    Social History  Substance Use Topics  . Smoking status: Current Every Day Smoker -- 1.00 packs/day for 40 years    Types: Cigarettes  . Smokeless tobacco: Never Used  . Alcohol Use: No    No family history on file.   Review of Systems  HENT: Positive for hearing loss.   Respiratory: Positive for shortness of breath.   Cardiovascular: Positive for palpitations.  Gastrointestinal: Positive for constipation, blood in stool and melena.  Musculoskeletal: Positive for joint pain.  Neurological: Positive for headaches.  All other systems reviewed and are negative.   Objective:  Physical Exam  Constitutional: He is oriented to person, place, and time. He appears well-developed and well-nourished. No distress.  HENT:  Head: Normocephalic and atraumatic.  Nose: Nose normal.  Eyes: Conjunctivae and EOM are normal. Pupils are equal, round, and reactive to light.  Neck: Normal range of motion. Neck supple.  Cardiovascular: Normal rate, regular rhythm, normal heart sounds and intact distal pulses.   Respiratory: Effort normal and breath sounds normal. No respiratory distress. He has no wheezes.  GI: Soft. Bowel sounds are normal. He exhibits no distension. There is no tenderness.  Musculoskeletal:       Left hip: He exhibits decreased range of motion and tenderness.  Lymphadenopathy:    He has no cervical adenopathy.  Neurological: He is alert and oriented to person, place, and time. No cranial nerve deficit.  Skin: Skin is warm and dry.     Psychiatric: He has a normal mood and affect. His behavior is normal.    Vital signs in last 24 hours: @VSRANGES @  Labs:   Estimated body mass index is 32.79 kg/(m^2) as calculated from the following:   Height as of 04/29/15: 5\' 11"  (1.803 m).   Weight as of 04/29/15: 106.595  kg (235 lb).   Imaging Review Plain radiographs demonstrate moderate degenerative joint disease of the left hip(s). The bone quality appears to be good for age and reported activity level.  Assessment/Plan:  End stage arthritis, left hip(s)  The patient history, physical examination, clinical judgement of the provider and imaging studies are consistent with end stage degenerative joint disease of the left hip(s) and total hip arthroplasty is deemed medically necessary. The treatment options including medical management, injection therapy, arthroscopy and arthroplasty were discussed at length. The risks and benefits of total hip arthroplasty were presented and reviewed. The risks due to aseptic loosening, infection, stiffness, dislocation/subluxation,  thromboembolic complications and other imponderables were discussed.  The patient acknowledged the explanation, agreed to proceed with the plan and consent was signed. Patient is being admitted for inpatient treatment for surgery, pain control, PT, OT, prophylactic antibiotics, VTE prophylaxis, progressive ambulation and ADL's and discharge planning.The patient is planning to be discharged home with home health services

## 2015-07-17 NOTE — Progress Notes (Signed)
Had difficult time controlling pt's pain all shift. Called on-call MD and spoke with Tasia Catchings about situation. Received verbal orders to increase pt's oxycodone from 10 mg to 5-15 mg q3 hours, add 10 mg oxycontin q 12 hours, as well as Ambien 5 mg at bedtime to help pt sleep. Placed orders and will continue to monitor

## 2015-07-17 NOTE — Evaluation (Signed)
Physical Therapy Evaluation Patient Details Name: Brandon Ferrell MRN: [id number] DOB: December 21, 1955 Today's Date: 07/17/2015   History of Present Illness  Patient is a 60 yo male admitted 07/17/15 now s/p Lt THA, with posterior hip precautions.   PMH:  OA, HLD, tobacco use, HTN, COPD, HOH, Rt THA 11/07/14  Clinical Impression  Patient presents with problems listed below.  Will benefit from acute PT to maximize functional mobility prior to d/c home with family.  Session limited today due to significant pain.  Will return in am.    Follow Up Recommendations Home health PT;Supervision for mobility/OOB    Equipment Recommendations  3in1 (PT)    Recommendations for Other Services       Precautions / Restrictions Precautions Precautions: Posterior Hip Precaution Booklet Issued: Yes (comment) Precaution Comments: Reviewed posterior hip precautions with patient and wife. Restrictions Weight Bearing Restrictions: Yes LLE Weight Bearing: Weight bearing as tolerated      Mobility  Bed Mobility Overal bed mobility: Needs Assistance             General bed mobility comments: Patient writhing and yelling in pain.  Calmed patient.  Explained that writhing while on Lt side causes LLE to adduct past midline and internally rotate. This could lead to dislocation of hip.  Patient calmed and repositioned in supine.  Instructed patient on ankle pumps.  Patient began yelling again asking for stronger pain meds.  RN to room.  Transfers                 General transfer comment: Declined due to pain.  Ambulation/Gait                Stairs            Wheelchair Mobility    Modified Rankin (Stroke Patients Only)       Balance                                             Pertinent Vitals/Pain Pain Assessment: 0-10 Pain Score: 10-Worst pain ever Pain Location: Lt hip Pain Descriptors / Indicators: Constant;Sharp;Moaning Pain Intervention(s): Limited  activity within patient's tolerance;Repositioned;Patient requesting pain meds-RN notified    Home Living Family/patient expects to be discharged to:: Private residence Living Arrangements: Spouse/significant other;Children Available Help at Discharge: Family;Available 24 hours/day Type of Home: Mobile home Home Access: Ramped entrance     Home Layout: One level Home Equipment: Walker - 2 wheels      Prior Function Level of Independence: Independent               Hand Dominance   Dominant Hand: Right    Extremity/Trunk Assessment   Upper Extremity Assessment: Overall WFL for tasks assessed           Lower Extremity Assessment: LLE deficits/detail   LLE Deficits / Details: Decreased strength and ROM due to pain, surgery, precautions.  Cervical / Trunk Assessment: Normal  Communication   Communication: No difficulties  Cognition Arousal/Alertness: Awake/alert Behavior During Therapy: Restless Overall Cognitive Status: Within Functional Limits for tasks assessed                      General Comments      Exercises Total Joint Exercises Ankle Circles/Pumps: AROM;Both;5 reps;Supine      Assessment/Plan    PT Assessment Patient needs continued PT services  PT Diagnosis Difficulty walking;Acute pain   PT Problem List Decreased strength;Decreased activity tolerance;Decreased balance;Decreased mobility;Decreased knowledge of use of DME;Decreased knowledge of precautions;Decreased safety awareness;Pain  PT Treatment Interventions DME instruction;Gait training;Functional mobility training;Therapeutic activities;Therapeutic exercise;Patient/family education   PT Goals (Current goals can be found in the Care Plan section) Acute Rehab PT Goals Patient Stated Goal: To decrease pain PT Goal Formulation: With patient/family Time For Goal Achievement: 07/24/15 Potential to Achieve Goals: Good    Frequency 7X/week   Barriers to discharge         Co-evaluation               End of Session   Activity Tolerance: Patient limited by pain Patient left: in bed;with call bell/phone within reach;with nursing/sitter in room;with family/visitor present Nurse Communication: Patient requests pain meds         Time: [id number] PT Time Calculation (min) (ACUTE ONLY): 9 min   Charges:   PT Evaluation $PT Eval Low Complexity: 1 Procedure     PT G CodesDespina Pole Jul 18, 2015, 4:40 PM Carita Pian. Sanjuana Kava, [place] Pager [phone]

## 2015-07-17 NOTE — Brief Op Note (Signed)
07/17/2015  10:01 AM  PATIENT:  Silvio Clayman  60 y.o. male  PRE-OPERATIVE DIAGNOSIS:  OA LEFT HIP  POST-OPERATIVE DIAGNOSIS:  OA LEFT HIP  PROCEDURE:  Procedure(s): LEFT TOTAL HIP ARTHROPLASTY (Left)  SURGEON:  Surgeon(s) and Role:    * Earlie Server, MD - Primary  PHYSICIAN ASSISTANT: Chriss Czar, PA-C  ASSISTANTS: OR staff x1  ANESTHESIA:   local and general  EBL:  Total I/O In: -  Out: 800 [Blood:800]  BLOOD ADMINISTERED:none  DRAINS: none   LOCAL MEDICATIONS USED:  MARCAINE     SPECIMEN:  No Specimen  DISPOSITION OF SPECIMEN:  N/A  COUNTS:  YES  TOURNIQUET:  * No tourniquets in log *  DICTATION: .Other Dictation: Dictation Number unknown  PLAN OF CARE: Admit to inpatient   PATIENT DISPOSITION:  PACU - hemodynamically stable.   Delay start of Pharmacological VTE agent (>24hrs) due to surgical blood loss or risk of bleeding: yes

## 2015-07-17 NOTE — Transfer of Care (Signed)
Immediate Anesthesia Transfer of Care Note  Patient: Brandon Ferrell  Procedure(s) Performed: Procedure(s): LEFT TOTAL HIP ARTHROPLASTY (Left)  Patient Location: PACU  Anesthesia Type:General  Level of Consciousness: awake, alert  and oriented  Airway & Oxygen Therapy: Patient Spontanous Breathing and Patient connected to nasal cannula oxygen  Post-op Assessment: Report given to RN and Post -op Vital signs reviewed and stable  Post vital signs: Reviewed and stable  Last Vitals:  Filed Vitals:   07/17/15 0623  BP: 140/84  Pulse: 64  Temp: 36.5 C  Resp: 18    Complications: No apparent anesthesia complications

## 2015-07-17 NOTE — Interval H&P Note (Signed)
History and Physical Interval Note:  07/17/2015 7:23 AM  Brandon Ferrell  has presented today for surgery, with the diagnosis of OA LEFT HIP  The various methods of treatment have been discussed with the patient and family. After consideration of risks, benefits and other options for treatment, the patient has consented to  Procedure(s): LEFT TOTAL HIP ARTHROPLASTY (Left) as a surgical intervention .  The patient's history has been reviewed, patient examined, no change in status, stable for surgery.  I have reviewed the patient's chart and labs.  Questions were answered to the patient's satisfaction.     [name],W D

## 2015-07-17 NOTE — Discharge Instructions (Signed)

## 2015-07-17 NOTE — Anesthesia Procedure Notes (Signed)
Procedure Name: Intubation Date/Time: 07/17/2015 7:38 AM Performed by: Clearnce Sorrel Pre-anesthesia Checklist: Patient identified, Emergency Drugs available, Suction available and Patient being monitored Patient Re-evaluated:Patient Re-evaluated prior to inductionOxygen Delivery Method: Circle System Utilized Preoxygenation: Pre-oxygenation with 100% oxygen Intubation Type: IV induction Ventilation: Two handed mask ventilation required Laryngoscope Size: Mac and 3 Grade View: Grade I Tube type: Oral Number of attempts: 1 Airway Equipment and Method: Stylet Placement Confirmation: ETT inserted through vocal cords under direct vision,  positive ETCO2 and breath sounds checked- equal and bilateral Secured at: 24 cm Tube secured with: Tape Dental Injury: Teeth and Oropharynx as per pre-operative assessment

## 2015-07-18 ENCOUNTER — Encounter (HOSPITAL_COMMUNITY): Payer: Self-pay | Admitting: Physician Assistant

## 2015-07-18 DIAGNOSIS — N289 Disorder of kidney and ureter, unspecified: Secondary | ICD-10-CM

## 2015-07-18 DIAGNOSIS — R338 Other retention of urine: Secondary | ICD-10-CM

## 2015-07-18 HISTORY — DX: Disorder of kidney and ureter, unspecified: N28.9

## 2015-07-18 HISTORY — DX: Other retention of urine: R33.8

## 2015-07-18 LAB — CBC
HCT: 34.3 % — ABNORMAL LOW (ref 39.0–52.0)
HEMOGLOBIN: 11.7 g/dL — AB (ref 13.0–17.0)
MCH: 29.9 pg (ref 26.0–34.0)
MCHC: 34.1 g/dL (ref 30.0–36.0)
MCV: 87.7 fL (ref 78.0–100.0)
Platelets: 178 10*3/uL (ref 150–400)
RBC: 3.91 MIL/uL — AB (ref 4.22–5.81)
RDW: 14 % (ref 11.5–15.5)
WBC: 12.3 10*3/uL — AB (ref 4.0–10.5)

## 2015-07-18 LAB — BASIC METABOLIC PANEL
ANION GAP: 11 (ref 5–15)
BUN: 21 mg/dL — ABNORMAL HIGH (ref 6–20)
CHLORIDE: 100 mmol/L — AB (ref 101–111)
CO2: 22 mmol/L (ref 22–32)
CREATININE: 1.68 mg/dL — AB (ref 0.61–1.24)
Calcium: 8.3 mg/dL — ABNORMAL LOW (ref 8.9–10.3)
GFR calc non Af Amer: 43 mL/min — ABNORMAL LOW (ref >60)
GFR, EST AFRICAN AMERICAN: 50 mL/min — AB (ref >60)
Glucose, Bld: 109 mg/dL — ABNORMAL HIGH (ref 65–99)
POTASSIUM: 3.9 mmol/L (ref 3.5–5.1)
SODIUM: 133 mmol/L — AB (ref 135–145)

## 2015-07-18 MED ORDER — SODIUM CHLORIDE 0.9 % IV BOLUS (SEPSIS)
500.0000 mL | Freq: Once | INTRAVENOUS | Status: AC
  Administered 2015-07-18: 500 mL via INTRAVENOUS

## 2015-07-18 MED ORDER — POLYETHYLENE GLYCOL 3350 17 G PO PACK
17.0000 g | PACK | Freq: Two times a day (BID) | ORAL | Status: DC
  Administered 2015-07-18 – 2015-07-20 (×4): 17 g via ORAL
  Filled 2015-07-18 (×5): qty 1

## 2015-07-18 MED ORDER — TAMSULOSIN HCL 0.4 MG PO CAPS
0.4000 mg | ORAL_CAPSULE | Freq: Every day | ORAL | Status: DC
  Administered 2015-07-18 – 2015-07-20 (×3): 0.4 mg via ORAL
  Filled 2015-07-18 (×3): qty 1

## 2015-07-18 NOTE — Progress Notes (Signed)
Physical Therapy Treatment Patient Details Name: Brandon Ferrell MRN: [id number] DOB: 05-Sep-1955 Today's Date: 07/18/2015    History of Present Illness Patient is a 60 y.o. male admitted 07/17/15 now s/p Lt THA, with posterior hip precautions.   PMH:  OA, HLD, tobacco use, HTN, COPD,  Rt THA 11/07/14, and presbyopia.    PT Comments    Pt performed increased gait distance with improved technique and decreased assist.  Pt c/o extreme pain advancing to edge of bed.  Pain in improved with gait and therapeutic exercise and IV dilaudid on board.  Pt educated to perform IS 1x10 reps every hour awake, Pt and wife agreeable.    Follow Up Recommendations  Home health PT;Supervision for mobility/OOB     Equipment Recommendations  3in1 (PT)    Recommendations for Other Services       Precautions / Restrictions Precautions Precautions: Posterior Hip Precaution Booklet Issued: No Precaution Comments: reviewed hip precautions with pt; Pt able to state 2/3 independently Restrictions Weight Bearing Restrictions: Yes LLE Weight Bearing: Weight bearing as tolerated    Mobility  Bed Mobility Overal bed mobility: Needs Assistance Bed Mobility: Supine to Sit     Supine to sit: Mod assist;HOB elevated (increased pain during transition,  Pt yelling in pain, nurse in room after hearing yelling to administer IV dilaudid.  )     General bed mobility comments: Pt tense during transition, presents with increased pain, once edge of bed pain improved.    Transfers Overall transfer level: Needs assistance Equipment used: Rolling walker (2 wheeled) Transfers: Sit to/from Stand Sit to Stand: Min guard            Ambulation/Gait Ambulation/Gait assistance: Min guard Ambulation Distance (Feet): 180 Feet Assistive device: Rolling walker (2 wheeled) Gait Pattern/deviations: Step-through pattern;Antalgic;Decreased stride length;Shuffle;Decreased stance time - left Gait velocity: Slow/guarded    General Gait Details: Pt required cues for gait symmetry, increasing stride length, upper trunk control, forward gaze, and L foot clearance (knee flexion in swing phase).     Stairs            Wheelchair Mobility    Modified Rankin (Stroke Patients Only)       Balance                                    Cognition Arousal/Alertness: Awake/alert Behavior During Therapy: WFL for tasks assessed/performed Overall Cognitive Status: Within Functional Limits for tasks assessed                      Exercises Total Joint Exercises Ankle Circles/Pumps: AROM;Both;10 reps Quad Sets: AROM;Left;10 reps Heel Slides: AAROM;Left;10 reps Hip ABduction/ADduction: AAROM;Left;10 reps Straight Leg Raises: AAROM;Left;10 reps Long Arc Quad: 10 reps;Left;AROM    General Comments        Pertinent Vitals/Pain Pain Assessment: 0-10 Pain Score: 10-Worst pain ever (pre tx, post tx 4/10 with IV dilaudid on board. ) Pain Location: L thigh Pain Descriptors / Indicators: Aching;Throbbing Pain Intervention(s): Monitored during session;Repositioned;RN gave pain meds during session;Ice applied    Home Living                      Prior Function            PT Goals (current goals can now be found in the care plan section) Acute Rehab PT Goals Patient Stated Goal: get back to home  improvement Potential to Achieve Goals: Good Progress towards PT goals: Progressing toward goals    Frequency  7X/week    PT Plan      Co-evaluation             End of Session Equipment Utilized During Treatment: Gait belt Activity Tolerance: Patient limited by pain Patient left: in bed;with call bell/phone within reach;with nursing/sitter in room;with family/visitor present     Time: 0316-0349 PT Time Calculation (min) (ACUTE ONLY): 33 min  Charges:  $Gait Training: 8-22 mins $Therapeutic Exercise: 8-22 mins                    G Codes:      [name] 08/04/2015, 3:57 PM  [name], PTA pager [phone]

## 2015-07-18 NOTE — Progress Notes (Signed)
Physical Therapy Treatment Patient Details Name: Brandon Ferrell MRN: [id number] DOB: 01-May-1955 Today's Date: 07/18/2015    History of Present Illness Patient is a 60 yo male admitted 07/17/15 now s/p Lt THA, with posterior hip precautions.   PMH:  OA, HLD, tobacco use, HTN, COPD, HOH, Rt THA 11/07/14    PT Comments    Pt performed increased activity and required cues for pacing, pursed lip breathing, upper trunk control.  Pt guarded due to pain but able to progress with intervention.  IS issued pt instructed on technique and educated to perform 1x10 reps every hour awake.  Pt agreeable.  CP applied post tx for pain.    Follow Up Recommendations  Home health PT;Supervision for mobility/OOB     Equipment Recommendations  3in1 (PT)    Recommendations for Other Services       Precautions / Restrictions Precautions Precaution Booklet Issued: Yes (comment) Precaution Comments: Reviewed posterior hip precautions pt recalled 2/3.Required cues for do not cross legs. Restrictions Weight Bearing Restrictions: Yes LLE Weight Bearing: Weight bearing as tolerated    Mobility  Bed Mobility Overal bed mobility: Needs Assistance Bed Mobility: Supine to Sit     Supine to sit: Min assist;HOB elevated     General bed mobility comments: Pt required min assist for advancement of LLE to edge of bed.  Pt able to elevate trunk into sitting during flat spin on bottom while PTA assisted LLE.    Transfers Overall transfer level: Needs assistance Equipment used: Rolling walker (2 wheeled) Transfers: Sit to/from Stand Sit to Stand: Min guard         General transfer comment: Cues for L LE advancement to maintain hip precautions.  Pt required cues to push from seated surface.  For stand to sit pt required cues for LLE advancement, hand placement and eccentric loading.    Ambulation/Gait Ambulation/Gait assistance: Min assist Ambulation Distance (Feet): 50 Feet Assistive device: Rolling walker  (2 wheeled) Gait Pattern/deviations: Step-through pattern;Decreased stride length;Antalgic;Trunk flexed;Shuffle;Decreased stance time - right;Decreased step length - left Gait velocity: Slow/guarded   General Gait Details: Pt required cues for gait symmetry, increasing stride length, upper trunk control, forward gaze, and L foot clearance (knee flexion in swing phase).     Stairs            Wheelchair Mobility    Modified Rankin (Stroke Patients Only)       Balance Overall balance assessment: Needs assistance   Sitting balance-Leahy Scale: Fair       Standing balance-Leahy Scale: Poor                      Cognition Arousal/Alertness: Awake/alert Behavior During Therapy: Restless Overall Cognitive Status: Within Functional Limits for tasks assessed                      Exercises Total Joint Exercises Ankle Circles/Pumps: AROM;Both;10 reps Quad Sets: AROM;Left;10 reps Gluteal Sets: AROM;Both;10 reps Heel Slides: AAROM;Left;10 reps Hip ABduction/ADduction: AAROM;Left;10 reps Straight Leg Raises: AAROM;Left;10 reps Long Arc Quad: AAROM;10 reps;Left;AROM (AROM 1x5 and then required assist to complete last 5 reps.  )    General Comments        Pertinent Vitals/Pain Pain Assessment: 0-10 Pain Score: 8  Pain Location: L hip Pain Descriptors / Indicators: Guarding;Grimacing Pain Intervention(s): Monitored during session;Repositioned;Ice applied    Home Living  Prior Function            PT Goals (current goals can now be found in the care plan section) Acute Rehab PT Goals Patient Stated Goal: To decrease pain Potential to Achieve Goals: Good Progress towards PT goals: Progressing toward goals    Frequency  7X/week    PT Plan      Co-evaluation             End of Session Equipment Utilized During Treatment: Gait belt Activity Tolerance: Patient limited by pain Patient left: in bed;with call  bell/phone within reach;with nursing/sitter in room;with family/visitor present     Time: [id number] PT Time Calculation (min) (ACUTE ONLY): 35 min  Charges:  $Gait Training: 8-22 mins $Therapeutic Exercise: 8-22 mins                    G Codes:      [name] Aug 06, 2015, 9:36 AM  [name], PTA pager [phone]

## 2015-07-18 NOTE — Op Note (Signed)
NAME:  Brandon Ferrell, Brandon Ferrell NO.:  [id number]  MEDICAL RECORD NO.:  [id number]  LOCATION:  [id number]                        FACILITY:  [facility]  PHYSICIAN:  Lockie Pares, M.D.    DATE OF BIRTH:  March 15, 1956  DATE OF PROCEDURE:  07/17/2015 DATE OF DISCHARGE:                              OPERATIVE REPORT   PREOPERATIVE DIAGNOSIS:  Avascular necrosis, left hip with superimposed osteoarthritis.  POSTOPERATIVE DIAGNOSIS:  Avascular necrosis, left hip with superimposed osteoarthritis.  OPERATION:  Left total hip replacement (AML 12 mm stem) with +8.5 mm neck length 36 mm ceramic hip ball with 56 mm acetabular cup with Gription with 10 degree lip liner.  SURGEON:  Lockie Pares, MD  ASSISTANT:  [place], PA  ANESTHESIA:  General.  BLOOD LOSS:  Approximately 400.  DESCRIPTION OF PROCEDURE:  Sterile prep and drape in the lateral position.  Posterior approach to the hip made, splitting the iliotibial band, gluteus maximus.  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  We cut the neck about 1 fingerbreadth above the lesser trochanter followed by progressive reaming and rasping to accept the 12 mm broach.  Attention was next directed to the acetabulum showed significant wear, medialized the acetabulum, then an approximately 45-50 degrees of abduction, progressively reamed up for a 1 mm under reaming to accept a 56 mm cup.  Acetabular retractors were placed anteriorly and inferiorly and wing retractors posteriorly and superiorly.  Final cup was inserted with approximately 45-50 degrees of abduction, 10-15 degrees of anteversion.  We then placed a trial liner in the cup.  Trialed off the broach and basically settled on an 8.5 mm tentative neck length which seemed to equalize neck leg lengths as well as was extremely stable, hip could only be dislocated, flex past 90 degrees,  full adduction and internal rotation past 60 degrees.  Then removed the trial broach as well as the trial acetabulum liner and then placed the final acetabular liner with the buildup about 3 o'clock position followed by placement of the stem.  We then trialed off the stem and then finally settled on the 8.5 mm neck length with an old ceramic head due to patient's relatively young age.  All components were stable.  Wounds were irrigated.  Closure was affected on the fascia with #1 Ethibond, 0 and 2-0 Vicryl, skin with a stapling device and Marcaine with epinephrine to the skin.  Lightly compressive sterile dressing applied, taken to recovery room in stable condition.     Lockie Pares, M.D.     WDC/MEDQ  D:  07/17/2015  T:  07/18/2015  Job:  [id number]

## 2015-07-18 NOTE — Evaluation (Signed)
Occupational Therapy Evaluation Patient Details Name: Brandon Ferrell MRN: [id number] DOB: 1955-08-22 Today's Date: 07/18/2015    History of Present Illness Patient is a 60 y.o. male admitted 07/17/15 now s/p Lt THA, with posterior hip precautions.   PMH:  OA, HLD, tobacco use, HTN, COPD,  Rt THA 11/07/14, and presbyopia.   Clinical Impression   Pt s/p above. Pt getting assist with ADLs, PTA. Feel pt will benefit from acute OT to increase independence prior to d/c. Recommending HHOT at this time.    Follow Up Recommendations  Supervision - Intermittent;Home health OT    Equipment Recommendations  Other (comment) (AE-long shoehorn, long sponge, sock aid)    Recommendations for Other Services       Precautions / Restrictions Precautions Precautions: Posterior Hip Precaution Booklet Issued: No Precaution Comments: reviewed hip precautions with pt; Pt able to state 2/3 independently Restrictions Weight Bearing Restrictions: Yes LLE Weight Bearing: Weight bearing as tolerated      Mobility Bed Mobility  General bed mobility comments: not assessed  Transfers Overall transfer level: Needs assistance Equipment used: Rolling walker (2 wheeled) Transfers: Sit to/from Stand Sit to Stand: Min guard           Balance Min guard for ambulation with RW. Attempted to take step without RW with handheld assist from OT and pt unable to do so.                         ADL Overall ADL's : Needs assistance/impaired                     Lower Body Dressing: Maximal assistance;Sit to/from stand   Toilet Transfer: Min guard;Ambulation;RW (sit to stand from chair)   Toileting- Water quality scientist and Hygiene: Min guard;Sit to/from stand       Functional mobility during ADLs: Min guard;Rolling walker General ADL Comments: Educated on LB dressing technique and AE.     Vision     Perception     Praxis      Pertinent Vitals/Pain Pain Assessment: 0-10 Pain  Score: 8  Pain Location: Left thigh Pain Descriptors / Indicators: Aching;Throbbing Pain Intervention(s): Monitored during session;Limited activity within patient's tolerance;Repositioned;Ice applied     Hand Dominance     Extremity/Trunk Assessment Upper Extremity Assessment Upper Extremity Assessment: Overall WFL for tasks assessed   Lower Extremity Assessment Lower Extremity Assessment: Defer to PT evaluation       Communication Communication Communication: No difficulties   Cognition Arousal/Alertness: Awake/alert Behavior During Therapy: WFL for tasks assessed/performed Overall Cognitive Status: Within Functional Limits for tasks assessed                     General Comments       Exercises       Shoulder Instructions      Home Living Family/patient expects to be discharged to:: Private residence Living Arrangements: Spouse/significant other;Children Available Help at Discharge: Family;Available 24 hours/day Type of Home: Mobile home Home Access: Ramped entrance     Home Layout: One level     Bathroom Shower/Tub: Tub/shower unit Shower/tub characteristics: Curtain Biochemist, clinical: Standard     Home Equipment: Environmental consultant - 2 wheels;Bedside commode;Adaptive equipment Adaptive Equipment: Reacher        Prior Functioning/Environment Level of Independence: Needs assistance    ADL's / Homemaking Assistance Needed: assist with bathing and dressing        OT Diagnosis: Acute pain  OT Problem List: Decreased strength;Decreased activity tolerance;Impaired balance (sitting and/or standing);Decreased knowledge of use of DME or AE;Decreased knowledge of precautions;Pain   OT Treatment/Interventions: Self-care/ADL training;DME and/or AE instruction;Therapeutic activities;Patient/family education;Balance training    OT Goals(Current goals can be found in the care plan section) Acute Rehab OT Goals Patient Stated Goal: get back to home improvement OT  Goal Formulation: With patient Time For Goal Achievement: 07/25/15 Potential to Achieve Goals: Good ADL Goals Pt Will Perform Lower Body Bathing: with set-up;with supervision;sit to/from stand;with adaptive equipment Pt Will Perform Lower Body Dressing: with set-up;with supervision;with adaptive equipment;sit to/from stand Pt Will Transfer to Toilet: with supervision;with set-up;ambulating (3 in 1 over commode) Pt Will Perform Tub/Shower Transfer: Tub transfer;ambulating;rolling walker;3 in 1;with min assist  OT Frequency: Min 2X/week   Barriers to D/C:            Co-evaluation              End of Session Equipment Utilized During Treatment: Gait belt;Rolling walker  Activity Tolerance: Patient limited by pain Patient left: in chair;with call bell/phone within reach   Time: [id number] OT Time Calculation (min): 14 min Charges:  OT General Charges $OT Visit: 1 Procedure OT Evaluation $OT Eval Moderate Complexity: 1 Procedure G-CodesBenito Mccreedy OTR/L [id number] 07/18/2015, 10:48 AM

## 2015-07-18 NOTE — Progress Notes (Addendum)
Subjective: 1 Day Post-Op Procedure(s) (LRB): LEFT TOTAL HIP ARTHROPLASTY (Left) Patient reports pain as 8 on 0-10 scale.   Patient reports I and O cath times 2. Objective: Vital signs in last 24 hours: Temp:  [97.8 F (36.6 C)-98.8 F (37.1 C)] 98.8 F (37.1 C) (04/15 0400) Pulse Rate:  [80-117] 117 (04/15 0930) Resp:  [15-25] 18 (04/15 0400) BP: (106-138)/(70-91) 120/84 mmHg (04/15 0400) SpO2:  [92 %-100 %] 97 % (04/15 0930)  Intake/Output from previous day: 04/14 0701 - 04/15 0700 In: 1280 [P.O.:480; I.V.:800] Out: 1425 [Urine:625; Blood:800] Intake/Output this shift:     Recent Labs  07/17/15 1304 07/18/15 0311  HGB 12.5* 11.7*    Recent Labs  07/17/15 1304 07/18/15 0311  WBC 12.4* 12.3*  RBC 4.20* 3.91*  HCT 37.2* 34.3*  PLT 172 178    Recent Labs  07/17/15 1304 07/18/15 0311  NA  --  133*  K  --  3.9  CL  --  100*  CO2  --  22  BUN  --  21*  CREATININE 1.33* 1.68*  GLUCOSE  --  109*  CALCIUM  --  8.3*   No results for input(s): LABPT, INR in the last 72 hours.  Neurovascular intact Sensation intact distally Dorsiflexion/Plantar flexion intact Incision: dressing C/D/I  Assessment/Plan: 1 Day Post-Op Procedure(s) (LRB): LEFT TOTAL HIP ARTHROPLASTY (Left)  Urinary retension Principal Problem:   Degenerative joint disease of left hip Active Problems:   Hyperlipidemia   TOBACCO ABUSE   Essential hypertension   INSOMNIA   Primary localized osteoarthritis of right hip   Acute urinary retention   Acute renal insufficiency  Advance diet Up with therapy  Place foley for urinary retension start flomax  Brandon Ferrell J 07/18/2015, 9:34 AM

## 2015-07-18 NOTE — Progress Notes (Signed)
Attempted inserted a 16 G foley but unsuccessful.  Will ask a second RN to attempt again.

## 2015-07-19 ENCOUNTER — Inpatient Hospital Stay (HOSPITAL_COMMUNITY): Payer: Medicare Other

## 2015-07-19 LAB — COMPREHENSIVE METABOLIC PANEL
ALBUMIN: 2.9 g/dL — AB (ref 3.5–5.0)
ALT: 21 U/L (ref 17–63)
AST: 39 U/L (ref 15–41)
Alkaline Phosphatase: 59 U/L (ref 38–126)
Anion gap: 8 (ref 5–15)
BUN: 17 mg/dL (ref 6–20)
CHLORIDE: 99 mmol/L — AB (ref 101–111)
CO2: 23 mmol/L (ref 22–32)
Calcium: 8.2 mg/dL — ABNORMAL LOW (ref 8.9–10.3)
Creatinine, Ser: 1.35 mg/dL — ABNORMAL HIGH (ref 0.61–1.24)
GFR calc Af Amer: >60 mL/min (ref >60)
GFR calc non Af Amer: 56 mL/min — ABNORMAL LOW (ref >60)
GLUCOSE: 99 mg/dL (ref 65–99)
POTASSIUM: 3.9 mmol/L (ref 3.5–5.1)
SODIUM: 130 mmol/L — AB (ref 135–145)
Total Bilirubin: 0.7 mg/dL (ref 0.3–1.2)
Total Protein: 6 g/dL — ABNORMAL LOW (ref 6.5–8.1)

## 2015-07-19 LAB — CBC
HEMATOCRIT: 30.8 % — AB (ref 39.0–52.0)
Hemoglobin: 10.4 g/dL — ABNORMAL LOW (ref 13.0–17.0)
MCH: 29.5 pg (ref 26.0–34.0)
MCHC: 33.8 g/dL (ref 30.0–36.0)
MCV: 87.5 fL (ref 78.0–100.0)
PLATELETS: 148 10*3/uL — AB (ref 150–400)
RBC: 3.52 MIL/uL — ABNORMAL LOW (ref 4.22–5.81)
RDW: 13.9 % (ref 11.5–15.5)
WBC: 10.8 10*3/uL — AB (ref 4.0–10.5)

## 2015-07-19 MED ORDER — BISACODYL 10 MG RE SUPP
10.0000 mg | Freq: Once | RECTAL | Status: AC
  Administered 2015-07-19: 10 mg via RECTAL

## 2015-07-19 MED ORDER — METOCLOPRAMIDE HCL 5 MG/ML IJ SOLN
10.0000 mg | Freq: Four times a day (QID) | INTRAMUSCULAR | Status: DC
  Administered 2015-07-19 – 2015-07-20 (×5): 10 mg via INTRAVENOUS
  Filled 2015-07-19 (×4): qty 2

## 2015-07-19 NOTE — Progress Notes (Signed)
Physical Therapy Treatment Patient Details Name: Brandon Ferrell MRN: [id number] DOB: Feb 28, 1956 Today's Date: 07/19/2015    History of Present Illness Patient is a 60 y.o. male admitted 07/17/15 now s/p Lt THA, with posterior hip precautions.   PMH:  OA, HLD, tobacco use, HTN, COPD,  Rt THA 11/07/14, and presbyopia.    PT Comments    Patient is progressing well toward mobility goals. Encouraged to work on [employer] throughout the day and given handout. Continue to progress as tolerated.   Follow Up Recommendations  Home health PT;Supervision for mobility/OOB     Equipment Recommendations  3in1 (PT)    Recommendations for Other Services       Precautions / Restrictions Precautions Precautions: Posterior Hip Precaution Booklet Issued: No Precaution Comments: reviewed hip precautions with pt; Pt able to state 2/3 independently Restrictions Weight Bearing Restrictions: Yes LLE Weight Bearing: Weight bearing as tolerated    Mobility  Bed Mobility Overal bed mobility: Needs Assistance Bed Mobility: Supine to Sit;Sit to Supine     Supine to sit: Supervision Sit to supine: Supervision   General bed mobility comments: cues for sequencing and technique; educated pt on use of sheet to elevate L LE into bed; increased time needed  Transfers Overall transfer level: Needs assistance Equipment used: Rolling walker (2 wheeled) Transfers: Sit to/from Stand Sit to Stand: Min guard         General transfer comment: cues for safe hand placement; good technique  Ambulation/Gait Ambulation/Gait assistance: Supervision Ambulation Distance (Feet): 200 Feet Assistive device: Rolling walker (2 wheeled) Gait Pattern/deviations: Step-through pattern;Decreased stride length;Decreased weight shift to left;Decreased stance time - left;Antalgic;Trunk flexed     General Gait Details: cues for position of RW, L heel strike, and sequencing of gait with use of AD; pt with improved gait mechanics and  safe use of DME with increased distance   Stairs            Wheelchair Mobility    Modified Rankin (Stroke Patients Only)       Balance Overall balance assessment: Needs assistance   Sitting balance-Leahy Scale: Good       Standing balance-Leahy Scale: Fair                      Cognition Arousal/Alertness: Awake/alert Behavior During Therapy: WFL for tasks assessed/performed Overall Cognitive Status: Within Functional Limits for tasks assessed                      Exercises Total Joint Exercises Hip ABduction/ADduction: AROM;Left;10 reps;Standing Knee Flexion: AROM;Left;10 reps;Standing Marching in Standing: AROM;Left;10 reps;Standing    General Comments General comments (skin integrity, edema, etc.): given HEP hand out and reviewed       Pertinent Vitals/Pain Pain Assessment: 0-10 Pain Score: 7  Pain Descriptors / Indicators: Constant;Discomfort;Dull;Aching Pain Intervention(s): Limited activity within patient's tolerance;Monitored during session;Premedicated before session;Repositioned    Home Living                      Prior Function            PT Goals (current goals can now be found in the care plan section) Acute Rehab PT Goals Patient Stated Goal: go home Progress towards PT goals: Progressing toward goals    Frequency  7X/week    PT Plan Current plan remains appropriate    Co-evaluation             End of Session  Equipment Utilized During Treatment: Gait belt Activity Tolerance: Patient tolerated treatment well Patient left: in bed;with call bell/phone within reach     Time: 1341-1408 PT Time Calculation (min) (ACUTE ONLY): 27 min  Charges:  $Gait Training: 8-22 mins $Therapeutic Exercise: 8-22 mins                    G Codes:      Salina April, PTA Pager: [phone]   07/19/2015, 2:26 PM

## 2015-07-19 NOTE — Progress Notes (Signed)
Subjective: This patient's pain is better today.  He has significant abdominal distension.     Objective: Vital signs in last 24 hours: Temp:  [98.3 F (36.8 C)-99.7 F (37.6 C)] 99 F (37.2 C) (04/16 0449) Pulse Rate:  [91-98] 93 (04/16 0449) Resp:  [17-18] 18 (04/16 0449) BP: (128-136)/(64-76) 128/64 mmHg (04/16 0449) SpO2:  [90 %-98 %] 98 % (04/16 0840)  Intake/Output from previous day: 04/15 0701 - 04/16 0700 In: 240 [P.O.:240] Out: 2450 [Urine:2450] Intake/Output this shift: Total I/O In: 240 [P.O.:240] Out: 950 [Urine:950]   Recent Labs  07/17/15 1304 07/18/15 0311 07/19/15 0237  HGB 12.5* 11.7* 10.4*    Recent Labs  07/18/15 0311 07/19/15 0237  WBC 12.3* 10.8*  RBC 3.91* 3.52*  HCT 34.3* 30.8*  PLT 178 148*    Recent Labs  07/17/15 1304 07/18/15 0311  NA  --  133*  K  --  3.9  CL  --  100*  CO2  --  22  BUN  --  21*  CREATININE 1.33* 1.68*  GLUCOSE  --  109*  CALCIUM  --  8.3*   No results for input(s): LABPT, INR in the last 72 hours.  Neurovascular intact Sensation intact distally Intact pulses distally Dorsiflexion/Plantar flexion intact Incision: dressing C/D/I  Assessment/Plan: Principal Problem:   Degenerative joint disease of left hip Active Problems:   Hyperlipidemia   TOBACCO ABUSE   Essential hypertension   INSOMNIA   Primary localized osteoarthritis of right hip   Acute urinary retention   Acute renal insufficiency  Just ordered CMET to assess kidney function.  Will switch to liquid diet until bowel are moving.  May be working on an early illeus.  Started Reglan 10 mg IV q 6 scheduled.  Will also do a dulcolax suppository.   Will D/C foley in am for voiding trial before discharge.   Zema Lizardo J 07/19/2015, 9:54 AM

## 2015-07-19 NOTE — Progress Notes (Signed)
Bladder scan perform and patient has 150 ml in his bladder, will continue to encourage to void.

## 2015-07-19 NOTE — Progress Notes (Signed)
Bladder scan performed and patient has 100 ml of urine. Will continue to encourage patient to void and repeat scan in 2 hours.

## 2015-07-19 NOTE — Progress Notes (Addendum)
Occupational Therapy Treatment Patient Details Name: Brandon Ferrell MRN: [id number] DOB: [birth year] Today's Date: 07/19/2015    History of present illness Patient is a 60 y.o. male admitted 07/17/15 now s/p Lt THA, with posterior hip precautions.   PMH:  OA, HLD, tobacco use, HTN, COPD,  Rt THA 11/07/14, and presbyopia.   OT comments  Pt progressing. Education provided in session. Plan to practice tub/shower transfer next session.  Follow Up Recommendations  Home health OT;Supervision - Intermittent    Equipment Recommendations  Other (comment) (AE-long shoehorn, long sponge, sock aid)    Recommendations for Other Services      Precautions / Restrictions Precautions Precautions: Posterior Hip Precaution Booklet Issued: No Precaution Comments: pt able to state 2/3 hip precautions Restrictions Weight Bearing Restrictions: Yes LLE Weight Bearing: Weight bearing as tolerated       Mobility Bed Mobility Overal bed mobility: Needs Assistance Bed Mobility: Supine to Sit;Sit to Supine     Supine to sit: Min assist Sit to supine: Min assist   General bed mobility comments: assist with left LE  Transfers Overall transfer level: Needs assistance Equipment used: Rolling walker (2 wheeled) Transfers: Sit to/from Stand Sit to Stand: Min guard         General transfer comment: cue for positioning of Lt LE    Balance    Able to pull up shorts while standing with RW in front without LOB.                               ADL Overall ADL's : Needs assistance/impaired                     Lower Body Dressing: Sit to/from stand;With adaptive equipment;Minimal assistance Lower Body Dressing Details (indicate cue type and reason): Min assist only to feed catheter through shorts; pt donned/doffed left sock and donned underwear.  Toilet Transfer: Min guard;Ambulation;RW (sit to stand from bed)           Functional mobility during ADLs: Min guard;Rolling  walker General ADL Comments: Educated on tub transfer techniques. Educated on LB dressing technique. Pt practiced with reacher and sock aid in session. Educated on safety.        Vision                     Perception     Praxis      Cognition  Awake/Alert Behavior During Therapy: WFL for tasks assessed/performed Overall Cognitive Status: Within Functional Limits for tasks assessed                       Extremity/Trunk Assessment               Exercises     Shoulder Instructions       General Comments      Pertinent Vitals/ Pain       Pain Assessment: 0-10 Pain Score: 8  Pain Location: Lt upper leg Pain Descriptors / Indicators: Aching;Burning Pain Intervention(s): Monitored during session;Limited activity within patient's tolerance  Home Living                                          Prior Functioning/Environment              Frequency Min 2X/week  Progress Toward Goals  OT Goals(current goals can now be found in the care plan section)  Progress towards OT goals: Progressing toward goals  Acute Rehab OT Goals Patient Stated Goal: not stated OT Goal Formulation: With patient Time For Goal Achievement: 07/25/15 Potential to Achieve Goals: Good ADL Goals Pt Will Perform Lower Body Bathing: with set-up;with supervision;sit to/from stand;with adaptive equipment Pt Will Perform Lower Body Dressing: with set-up;with supervision;with adaptive equipment;sit to/from stand Pt Will Transfer to Toilet: with supervision;with set-up;ambulating (3 in 1 over commode) Pt Will Perform Tub/Shower Transfer: Tub transfer;ambulating;rolling walker;3 in 1;with min assist Additional ADL Goal #1: Pt will perform bed mobility at supervision level with HOB flat and no rails as precursor for ADLs.  Plan Discharge plan remains appropriate    Co-evaluation                 End of Session Equipment Utilized During Treatment:  Rolling walker;Other (comment) (AE)   Activity Tolerance Patient limited by pain   Patient Left in bed;with call bell/phone within reach   Nurse Communication          Time: 0906-0920 OT Time Calculation (min): 14 min  Charges: OT General Charges $OT Visit: 1 Procedure OT Treatments $Self Care/Home Management : 8-22 mins  Benito Mccreedy OTR/L [id number] 07/19/2015, 9:46 AM

## 2015-07-19 NOTE — Progress Notes (Signed)
PT Cancellation Note  Patient Details Name: REG UHLAND MRN: [id number] DOB: 1955/04/09   Cancelled Treatment:    Reason Eval/Treat Not Completed: Patient declined, no reason specified Pt declined due to pain due to ambulating with wife this am and working with OT. PT will check on pt later as time allows.    Salina April, PTA Pager: [phone]   07/19/2015, 11:47 AM

## 2015-07-19 NOTE — Progress Notes (Signed)
Patient could not void this shift. Bladder scan revealed 300 ml. In/out was performed and 500 ml was obtained. Will continue to monitor.

## 2015-07-20 LAB — CBC
HEMATOCRIT: 28 % — AB (ref 39.0–52.0)
Hemoglobin: 9.5 g/dL — ABNORMAL LOW (ref 13.0–17.0)
MCH: 29.7 pg (ref 26.0–34.0)
MCHC: 33.9 g/dL (ref 30.0–36.0)
MCV: 87.5 fL (ref 78.0–100.0)
Platelets: 148 10*3/uL — ABNORMAL LOW (ref 150–400)
RBC: 3.2 MIL/uL — ABNORMAL LOW (ref 4.22–5.81)
RDW: 13.9 % (ref 11.5–15.5)
WBC: 9.7 10*3/uL (ref 4.0–10.5)

## 2015-07-20 MED ORDER — NALOXEGOL OXALATE 25 MG PO TABS
25.0000 mg | ORAL_TABLET | Freq: Every day | ORAL | Status: DC
  Administered 2015-07-20: 25 mg via ORAL
  Filled 2015-07-20: qty 1

## 2015-07-20 MED ORDER — NALOXEGOL OXALATE 25 MG PO TABS: 25.0000 mg | ORAL_TABLET | Freq: Every day | ORAL | Status: DC

## 2015-07-20 MED ORDER — FLEET ENEMA 7-19 GM/118ML RE ENEM: 1.0000 | ENEMA | Freq: Once | RECTAL | Status: DC | PRN

## 2015-07-20 MED ORDER — METOCLOPRAMIDE HCL 10 MG PO TABS: ORAL_TABLET | ORAL | Status: DC

## 2015-07-20 MED ORDER — POLYETHYLENE GLYCOL 3350 17 G PO PACK: 17.0000 g | PACK | Freq: Two times a day (BID) | ORAL | Status: DC

## 2015-07-20 MED ORDER — DOCUSATE SODIUM 100 MG PO CAPS: 100.0000 mg | ORAL_CAPSULE | Freq: Two times a day (BID) | ORAL | Status: DC

## 2015-07-20 MED ORDER — TAMSULOSIN HCL 0.4 MG PO CAPS: 0.4000 mg | ORAL_CAPSULE | Freq: Every day | ORAL | Status: DC

## 2015-07-20 NOTE — Progress Notes (Signed)
Physical Therapy Treatment Patient Details Name: Brandon Ferrell MRN: [id number] DOB: 1955/07/23 Today's Date: 07/20/2015    History of Present Illness Patient is a 60 y.o. male admitted 07/17/15 now s/p Lt THA, with posterior hip precautions.   PMH:  OA, HLD, tobacco use, HTN, COPD,  Rt THA 11/07/14, and presbyopia.    PT Comments    Patient led through HEP and reviewed handout. Pt declined OOB mobility this session despite max encouragement due to c/o stomach pain and needing to have BM. Continue to progress as tolerated with anticipated d/c home with HHPT.   Follow Up Recommendations  Home health PT;Supervision for mobility/OOB     Equipment Recommendations  3in1 (PT)    Recommendations for Other Services       Precautions / Restrictions Precautions Precautions: Posterior Hip Precaution Booklet Issued: No Precaution Comments: reviewed hip precautions with pt; Pt able to state 2/3 independently Restrictions Weight Bearing Restrictions: Yes LLE Weight Bearing: Weight bearing as tolerated    Mobility  Bed Mobility                  Transfers                    Ambulation/Gait                 Stairs            Wheelchair Mobility    Modified Rankin (Stroke Patients Only)       Balance                                    Cognition Arousal/Alertness: Awake/alert Behavior During Therapy: WFL for tasks assessed/performed Overall Cognitive Status: Within Functional Limits for tasks assessed                      Exercises Total Joint Exercises Quad Sets: AROM;Both;15 reps;Supine Gluteal Sets: AROM;Both;15 reps;Supine Heel Slides: AROM;Left;15 reps;Supine Hip ABduction/ADduction: AROM;Left;15 reps;Supine Straight Leg Raises: AAROM;Left;5 reps;Supine    General Comments General comments (skin integrity, edema, etc.): pt declined OOB mobility due to c/o stomach pain needing to have BM; therapist educated pt that  OOB mobility/ambulation will aid in having BM and encouraged OOB mobility; pt verbalized understanding but declined      Pertinent Vitals/Pain Pain Assessment: 0-10 Pain Score: 8  Pain Location: L hip Pain Descriptors / Indicators: Constant;Aching;Dull Pain Intervention(s): Monitored during session;Premedicated before session;Repositioned    Home Living                      Prior Function            PT Goals (current goals can now be found in the care plan section) Acute Rehab PT Goals Patient Stated Goal: have a BM Progress towards PT goals: Progressing toward goals    Frequency  7X/week    PT Plan Current plan remains appropriate    Co-evaluation             End of Session Equipment Utilized During Treatment: Gait belt Activity Tolerance: Patient tolerated treatment well Patient left: in bed;with call bell/phone within reach     Time: 0857-0913 PT Time Calculation (min) (ACUTE ONLY): 16 min  Charges:  $Therapeutic Exercise: 8-22 mins                    G Codes:  Salina April, PTA Pager: [phone]   07/20/2015, 9:18 AM

## 2015-07-20 NOTE — Progress Notes (Signed)
OT NOTE  Pt. Eager for d/c home.  Declined need for physical review of tub transfer or toileting.  Was agreeable to walking with PT.  Provided A/E including sock-aide, LH shoe horn, and LH sponge.  Reviewed use with pts. Wife also as she reports she is the primary caregiver at home.  States no further questions or concerns prior to d/c home.  Will alert OTR/L to sign off as pt. Reports no other OT needs.   Romana Juniper, COTA/L

## 2015-07-20 NOTE — Care Management Important Message (Signed)
Important Message  Patient Details  Name: Brandon Ferrell MRN: [id number] Date of Birth: Mar 15, 1956   Medicare Important Message Given:  Yes    Justus Droke P [place] 07/20/2015, 1:58 PM

## 2015-07-20 NOTE — Progress Notes (Signed)
Pt ready for discharge. Education/instructions reviewed with pt and all questions/cocnerns addressed. IV removed and belongings gathered. Pt will be transported out via wheelchair to wife's car. Will continue to monitor

## 2015-07-20 NOTE — Progress Notes (Signed)
Subjective: 3 Days Post-Op Procedure(s) (LRB): LEFT TOTAL HIP ARTHROPLASTY (Left) Patient reports pain as mild and moderate.  Loose BM this AM, +flatus.    Objective: Vital signs in last 24 hours: Temp:  [99.1 F (37.3 C)-100.6 F (38.1 C)] 100.6 F (38.1 C) (04/17 0458) Pulse Rate:  [83-101] 83 (04/16 2252) Resp:  [16-18] 18 (04/17 0458) BP: (119-129)/(53-85) 129/53 mmHg (04/17 0458) SpO2:  [89 %-99 %] 93 % (04/17 0458)  Intake/Output from previous day: 04/16 0701 - 04/17 0700 In: 600 [P.O.:600] Out: 3150 [Urine:3150] Intake/Output this shift:     Recent Labs  07/17/15 1304 07/18/15 0311 07/19/15 0237 07/20/15 0400  HGB 12.5* 11.7* 10.4* 9.5*    Recent Labs  07/19/15 0237 07/20/15 0400  WBC 10.8* 9.7  RBC 3.52* 3.20*  HCT 30.8* 28.0*  PLT 148* 148*    Recent Labs  07/18/15 0311 07/19/15 1005  NA 133* 130*  K 3.9 3.9  CL 100* 99*  CO2 22 23  BUN 21* 17  CREATININE 1.68* 1.35*  GLUCOSE 109* 99  CALCIUM 8.3* 8.2*   No results for input(s): LABPT, INR in the last 72 hours.  Neurovascular intact Sensation intact distally Intact pulses distally Dorsiflexion/Plantar flexion intact Incision: dressing C/D/I abdomen with mild to moderate distention, +bowel sounds  Assessment/Plan: 3 Days Post-Op Procedure(s) (LRB): LEFT TOTAL HIP ARTHROPLASTY (Left) Up with therapy WBAT LLE, post hip precautions lovenox dvt proph Pain meds as ordered Cont with liquid diet and encourage fluid intake Foley was d/c for voiding trial already Possible d/c home later this afternoon  Chriss Czar 07/20/2015, 8:49 AM

## 2015-07-20 NOTE — Progress Notes (Signed)
Physical Therapy Treatment Patient Details Name: Brandon Ferrell MRN: [id number] DOB: [birth year] Today's Date: 07/20/2015    History of Present Illness Patient is a 60 y.o. male admitted 07/17/15 now s/p Lt THA, with posterior hip precautions.   PMH:  OA, HLD, tobacco use, HTN, COPD,  Rt THA 11/07/14, and presbyopia.    PT Comments    Patient is making good progress with PT.  From a mobility standpoint anticipate patient will be ready for DC home when medically ready.     Follow Up Recommendations  Home health PT;Supervision for mobility/OOB     Equipment Recommendations  3in1 (PT)    Recommendations for Other Services       Precautions / Restrictions Precautions Precautions: Posterior Hip Precaution Booklet Issued: No Precaution Comments: reviewed hip precautions with pt; Pt able to state 3/3 independently Restrictions Weight Bearing Restrictions: Yes LLE Weight Bearing: Weight bearing as tolerated    Mobility  Bed Mobility Overal bed mobility: Needs Assistance Bed Mobility: Supine to Sit;Sit to Supine     Supine to sit: Supervision Sit to supine: Supervision   General bed mobility comments: carry over of technique; maintained hip precuations; increased time   Transfers Overall transfer level: Needs assistance Equipment used: Rolling walker (2 wheeled) Transfers: Sit to/from Stand Sit to Stand: Supervision         General transfer comment: cues for safety  Ambulation/Gait Ambulation/Gait assistance: Supervision Ambulation Distance (Feet): 220 Feet Assistive device: Rolling walker (2 wheeled) Gait Pattern/deviations: Step-through pattern;Decreased weight shift to left;Trunk flexed     General Gait Details: cues for posture and position of RW; pt with improved bilat step symmetry and heel strike; able to increase WS to L LE this session   Stairs            Wheelchair Mobility    Modified Rankin (Stroke Patients Only)       Balance                                     Cognition Arousal/Alertness: Awake/alert Behavior During Therapy: WFL for tasks assessed/performed Overall Cognitive Status: Within Functional Limits for tasks assessed                      Exercises Total Joint Exercises Quad Sets: AROM;Both;15 reps;Supine Gluteal Sets: AROM;Both;15 reps;Supine Heel Slides: AROM;Left;15 reps;Supine Hip ABduction/ADduction: AROM;Left;15 reps;Supine Straight Leg Raises: AAROM;Left;5 reps;Supine    General Comments General comments (skin integrity, edema, etc.): pt declined OOB mobility due to c/o stomach pain needing to have BM; therapist educated pt that OOB mobility/ambulation will aid in having BM and encouraged OOB mobility; pt verbalized understanding but declined      Pertinent Vitals/Pain Pain Assessment: 0-10 Pain Score: 8  Pain Location: L hip Pain Descriptors / Indicators: Aching;Constant;Dull Pain Intervention(s): Monitored during session;Premedicated before session    Home Living                      Prior Function            PT Goals (current goals can now be found in the care plan section) Acute Rehab PT Goals Patient Stated Goal: go home Progress towards PT goals: Progressing toward goals    Frequency  7X/week    PT Plan Current plan remains appropriate    Co-evaluation  End of Session Equipment Utilized During Treatment: Gait belt Activity Tolerance: Patient tolerated treatment well Patient left: in bed;with call bell/phone within reach;with family/visitor present     Time: 1200-1210 PT Time Calculation (min) (ACUTE ONLY): 10 min  Charges:  $Gait Training: 8-22 mins $Therapeutic Exercise: 8-22 mins                    G Codes:      Salina April, PTA Pager: [phone]   07/20/2015, 1:11 PM

## 2015-07-20 NOTE — Discharge Summary (Signed)
PATIENT ID: Brandon Ferrell        MRN:  [id number]          DOB/AGE: [birth year] / 60 y.o.    DISCHARGE SUMMARY  ADMISSION DATE:    07/17/2015 DISCHARGE DATE:   07/20/2015   ADMISSION DIAGNOSIS: OA LEFT HIP    DISCHARGE DIAGNOSIS:  OA LEFT HIP    ADDITIONAL DIAGNOSIS: Principal Problem:   Degenerative joint disease of left hip Active Problems:   Hyperlipidemia   TOBACCO ABUSE   Essential hypertension   INSOMNIA   Primary localized osteoarthritis of right hip   Acute urinary retention   Acute renal insufficiency  Past Medical History  Diagnosis Date  . Palpitations   . Hypertension   . COPD (chronic obstructive pulmonary disease) ([place])   . Pneumonia     hx  . GERD (gastroesophageal reflux disease)     occ zantac  . Arthritis   . Acute urinary retention 07/18/2015  . Acute renal insufficiency 07/18/2015    PROCEDURE: Procedure(s): LEFT TOTAL HIP ARTHROPLASTY Left on 07/17/2015  CONSULTS: PT/OT      HISTORY:  See H&P in chart  HOSPITAL COURSE:  Brandon Ferrell is a 60 y.o. admitted on 07/17/2015 and found to have a diagnosis of OA LEFT HIP.  After appropriate laboratory studies were obtained  they were taken to the operating room on 07/17/2015 and underwent  Procedure(s): LEFT TOTAL HIP ARTHROPLASTY  Left.   They were given perioperative antibiotics:      Anti-infectives    Start     Dose/Rate Route Frequency Ordered Stop   07/17/15 1400  ceFAZolin (ANCEF) IVPB 2g/100 mL premix     2 g 200 mL/hr over 30 Minutes Intravenous Every 6 hours 07/17/15 1225 07/17/15 2119   07/17/15 0700  ceFAZolin (ANCEF) IVPB 2g/100 mL premix     2 g 200 mL/hr over 30 Minutes Intravenous To ShortStay Surgical 07/16/15 0856 07/17/15 0740    .  Tolerated the procedure well.    POD #1, allowed out of bed to a chair.  PT for ambulation and exercise program.  IV saline locked.  O2 discontionued. Urinary retention and constipation started on flomax foley placed, bowel rest with liquid  diet KUB negative for obstruction.  POD #2, continued PT and ambulation.   Marland Kitchen POD#3 2 loose BM, still with abdominal distention positive flatus, voiding trial foley d/c'd voided 200 ml.  The remainder of the hospital course was dedicated to ambulation and strengthening.   The patient was discharged on 3 Days Post-Op in  Stable condition.  Blood products given:none  DIAGNOSTIC STUDIES: Recent vital signs:  Patient Vitals for the past 24 hrs:  BP Temp Temp src Pulse Resp SpO2  07/20/15 0458 (!) 129/53 mmHg (!) 100.6 F (38.1 C) - - 18 93 %  07/19/15 2252 122/85 mmHg 99.1 F (37.3 C) Oral 83 16 98 %  07/19/15 2239 - - - - - 99 %  07/19/15 1520 119/66 mmHg 99.6 F (37.6 C) Oral (!) 101 17 (!) 89 %       Recent laboratory studies:  Recent Labs  07/17/15 1304 07/18/15 0311 07/19/15 0237 07/20/15 0400  WBC 12.4* 12.3* 10.8* 9.7  HGB 12.5* 11.7* 10.4* 9.5*  HCT 37.2* 34.3* 30.8* 28.0*  PLT 172 178 148* 148*    Recent Labs  07/17/15 1304 07/18/15 0311 07/19/15 1005  NA  --  133* 130*  K  --  3.9 3.9  CL  --  100* 99*  CO2  --  22 23  BUN  --  21* 17  CREATININE 1.33* 1.68* 1.35*  GLUCOSE  --  109* 99  CALCIUM  --  8.3* 8.2*   Lab Results  Component Value Date   INR 1.04 07/06/2015   INR 1.05 10/27/2014   INR 0.9 06/20/2007     Recent Radiographic Studies :  Dg Abd Portable 1v  07/19/2015  CLINICAL DATA:  60 year old male with abdominal distention. History of gastroesophageal reflux disease. EXAM: PORTABLE ABDOMEN - 1 VIEW COMPARISON:  No priors. FINDINGS: Gas and stool are seen scattered throughout the colon extending to the level of the distal rectum. No pathologic distension of small bowel is noted. No gross evidence of pneumoperitoneum. Surgical clips overlying the left buttock region. Bilateral total hip arthroplasties are noted. IMPRESSION: 1. Nonobstructive bowel gas pattern. 2. No pneumoperitoneum. 3. Postoperative changes, as above. Electronically Signed   By:  Vinnie Langton M.D.   On: 07/19/2015 10:20   Dg Hip Port Unilat With Pelvis 1v Left  07/17/2015  CLINICAL DATA:  Hip replacement. EXAM: DG HIP (WITH OR WITHOUT PELVIS) 1V PORT LEFT COMPARISON:  MRI 06/05/2014 . FINDINGS: Degenerative changes lumbar spine. Bilateral hip replacements. Hardware intact. Postsurgical soft tissue changes on the left. No acute bony abnormality. IMPRESSION: Bilateral hip replacements. Postsurgical changes noted in the soft tissues on the left. Good anatomic alignment. Hardware intact. No acute abnormality. Electronically Signed   By: Marcello Moores  Register   On: 07/17/2015 10:59    DISCHARGE INSTRUCTIONS:   DISCHARGE MEDICATIONS:     Medication List    STOP taking these medications        Oxycodone HCl 10 MG Tabs      TAKE these medications        albuterol 108 (90 Base) MCG/ACT inhaler  Commonly known as:  PROVENTIL HFA;VENTOLIN HFA  Inhale 2 puffs into the lungs every 6 (six) hours as needed for wheezing or shortness of breath.     amLODipine 5 MG tablet  Commonly known as:  NORVASC  Take 5 mg by mouth daily.     budesonide-formoterol 160-4.5 MCG/ACT inhaler  Commonly known as:  SYMBICORT  Inhale 2 puffs into the lungs 2 (two) times daily.     diphenhydrAMINE 25 MG tablet  Commonly known as:  BENADRYL  Take 25 mg by mouth at bedtime.     docusate sodium 100 MG capsule  Commonly known as:  COLACE  Take 1 capsule (100 mg total) by mouth 2 (two) times daily.     enoxaparin 30 MG/0.3ML injection  Commonly known as:  LOVENOX  Inject 0.3 mLs (30 mg total) into the skin every 12 (twelve) hours.     methocarbamol 750 MG tablet  Commonly known as:  ROBAXIN-750  Take 1 tablet (750 mg total) by mouth every 6 (six) hours as needed for muscle spasms.     naloxegol oxalate 25 MG Tabs tablet  Commonly known as:  MOVANTIK  Take 1 tablet (25 mg total) by mouth daily.     oxyCODONE-acetaminophen 10-325 MG tablet  Commonly known as:  PERCOCET  Take 1 tablet  by mouth every 4 (four) hours as needed for pain.     pravastatin 80 MG tablet  Commonly known as:  PRAVACHOL  Take 80 mg by mouth daily.     tamsulosin 0.4 MG Caps capsule  Commonly known as:  FLOMAX  Take 1 capsule (0.4 mg total) by mouth daily.  FOLLOW UP VISIT:   Follow-up Information    Follow up with CAFFREY JR,W D, MD. Schedule an appointment as soon as possible for a visit in 2 weeks.   Specialty:  Orthopedic Surgery   Contact information:   [place] [address] [phone]       DISPOSITION:   Home  CONDITION:  Stable   Chriss Czar, PA-C  07/20/2015 1:26 PM

## 2015-07-21 ENCOUNTER — Encounter (HOSPITAL_COMMUNITY): Payer: Self-pay | Admitting: Orthopedic Surgery

## 2015-07-24 DIAGNOSIS — E785 Hyperlipidemia, unspecified: Secondary | ICD-10-CM | POA: Diagnosis not present

## 2015-07-24 DIAGNOSIS — Z471 Aftercare following joint replacement surgery: Secondary | ICD-10-CM | POA: Diagnosis not present

## 2015-07-24 DIAGNOSIS — J449 Chronic obstructive pulmonary disease, unspecified: Secondary | ICD-10-CM | POA: Diagnosis not present

## 2015-07-24 DIAGNOSIS — K219 Gastro-esophageal reflux disease without esophagitis: Secondary | ICD-10-CM | POA: Diagnosis not present

## 2015-07-24 DIAGNOSIS — Z720 Tobacco use: Secondary | ICD-10-CM | POA: Diagnosis not present

## 2015-07-24 DIAGNOSIS — Z7951 Long term (current) use of inhaled steroids: Secondary | ICD-10-CM | POA: Diagnosis not present

## 2015-07-24 DIAGNOSIS — I10 Essential (primary) hypertension: Secondary | ICD-10-CM | POA: Diagnosis not present

## 2015-07-24 DIAGNOSIS — Z7901 Long term (current) use of anticoagulants: Secondary | ICD-10-CM | POA: Diagnosis not present

## 2015-07-24 DIAGNOSIS — Z96642 Presence of left artificial hip joint: Secondary | ICD-10-CM | POA: Diagnosis not present

## 2015-07-27 DIAGNOSIS — Z720 Tobacco use: Secondary | ICD-10-CM | POA: Diagnosis not present

## 2015-07-27 DIAGNOSIS — Z96642 Presence of left artificial hip joint: Secondary | ICD-10-CM | POA: Diagnosis not present

## 2015-07-27 DIAGNOSIS — Z7951 Long term (current) use of inhaled steroids: Secondary | ICD-10-CM | POA: Diagnosis not present

## 2015-07-27 DIAGNOSIS — K219 Gastro-esophageal reflux disease without esophagitis: Secondary | ICD-10-CM | POA: Diagnosis not present

## 2015-07-27 DIAGNOSIS — I10 Essential (primary) hypertension: Secondary | ICD-10-CM | POA: Diagnosis not present

## 2015-07-27 DIAGNOSIS — Z471 Aftercare following joint replacement surgery: Secondary | ICD-10-CM | POA: Diagnosis not present

## 2015-07-27 DIAGNOSIS — Z7901 Long term (current) use of anticoagulants: Secondary | ICD-10-CM | POA: Diagnosis not present

## 2015-07-27 DIAGNOSIS — E785 Hyperlipidemia, unspecified: Secondary | ICD-10-CM | POA: Diagnosis not present

## 2015-07-27 DIAGNOSIS — J449 Chronic obstructive pulmonary disease, unspecified: Secondary | ICD-10-CM | POA: Diagnosis not present

## 2015-07-30 DIAGNOSIS — E785 Hyperlipidemia, unspecified: Secondary | ICD-10-CM | POA: Diagnosis not present

## 2015-07-30 DIAGNOSIS — Z7951 Long term (current) use of inhaled steroids: Secondary | ICD-10-CM | POA: Diagnosis not present

## 2015-07-30 DIAGNOSIS — I10 Essential (primary) hypertension: Secondary | ICD-10-CM | POA: Diagnosis not present

## 2015-07-30 DIAGNOSIS — Z471 Aftercare following joint replacement surgery: Secondary | ICD-10-CM | POA: Diagnosis not present

## 2015-07-30 DIAGNOSIS — J449 Chronic obstructive pulmonary disease, unspecified: Secondary | ICD-10-CM | POA: Diagnosis not present

## 2015-07-30 DIAGNOSIS — Z7901 Long term (current) use of anticoagulants: Secondary | ICD-10-CM | POA: Diagnosis not present

## 2015-07-30 DIAGNOSIS — Z720 Tobacco use: Secondary | ICD-10-CM | POA: Diagnosis not present

## 2015-07-30 DIAGNOSIS — Z96642 Presence of left artificial hip joint: Secondary | ICD-10-CM | POA: Diagnosis not present

## 2015-07-30 DIAGNOSIS — M1612 Unilateral primary osteoarthritis, left hip: Secondary | ICD-10-CM | POA: Diagnosis not present

## 2015-07-30 DIAGNOSIS — K219 Gastro-esophageal reflux disease without esophagitis: Secondary | ICD-10-CM | POA: Diagnosis not present

## 2015-08-04 DIAGNOSIS — Z7951 Long term (current) use of inhaled steroids: Secondary | ICD-10-CM | POA: Diagnosis not present

## 2015-08-04 DIAGNOSIS — Z720 Tobacco use: Secondary | ICD-10-CM | POA: Diagnosis not present

## 2015-08-04 DIAGNOSIS — J449 Chronic obstructive pulmonary disease, unspecified: Secondary | ICD-10-CM | POA: Diagnosis not present

## 2015-08-04 DIAGNOSIS — E785 Hyperlipidemia, unspecified: Secondary | ICD-10-CM | POA: Diagnosis not present

## 2015-08-04 DIAGNOSIS — Z471 Aftercare following joint replacement surgery: Secondary | ICD-10-CM | POA: Diagnosis not present

## 2015-08-04 DIAGNOSIS — Z96642 Presence of left artificial hip joint: Secondary | ICD-10-CM | POA: Diagnosis not present

## 2015-08-04 DIAGNOSIS — Z7901 Long term (current) use of anticoagulants: Secondary | ICD-10-CM | POA: Diagnosis not present

## 2015-08-04 DIAGNOSIS — I10 Essential (primary) hypertension: Secondary | ICD-10-CM | POA: Diagnosis not present

## 2015-08-04 DIAGNOSIS — K219 Gastro-esophageal reflux disease without esophagitis: Secondary | ICD-10-CM | POA: Diagnosis not present

## 2015-08-06 DIAGNOSIS — E785 Hyperlipidemia, unspecified: Secondary | ICD-10-CM | POA: Diagnosis not present

## 2015-08-06 DIAGNOSIS — I10 Essential (primary) hypertension: Secondary | ICD-10-CM | POA: Diagnosis not present

## 2015-08-06 DIAGNOSIS — Z471 Aftercare following joint replacement surgery: Secondary | ICD-10-CM | POA: Diagnosis not present

## 2015-08-06 DIAGNOSIS — Z7901 Long term (current) use of anticoagulants: Secondary | ICD-10-CM | POA: Diagnosis not present

## 2015-08-06 DIAGNOSIS — Z7951 Long term (current) use of inhaled steroids: Secondary | ICD-10-CM | POA: Diagnosis not present

## 2015-08-06 DIAGNOSIS — K219 Gastro-esophageal reflux disease without esophagitis: Secondary | ICD-10-CM | POA: Diagnosis not present

## 2015-08-06 DIAGNOSIS — Z96642 Presence of left artificial hip joint: Secondary | ICD-10-CM | POA: Diagnosis not present

## 2015-08-06 DIAGNOSIS — Z720 Tobacco use: Secondary | ICD-10-CM | POA: Diagnosis not present

## 2015-08-06 DIAGNOSIS — J449 Chronic obstructive pulmonary disease, unspecified: Secondary | ICD-10-CM | POA: Diagnosis not present

## 2015-08-12 DIAGNOSIS — M7062 Trochanteric bursitis, left hip: Secondary | ICD-10-CM | POA: Diagnosis not present

## 2015-08-12 DIAGNOSIS — E784 Other hyperlipidemia: Secondary | ICD-10-CM | POA: Diagnosis not present

## 2015-08-12 DIAGNOSIS — M545 Low back pain: Secondary | ICD-10-CM | POA: Diagnosis not present

## 2015-08-12 DIAGNOSIS — M2550 Pain in unspecified joint: Secondary | ICD-10-CM | POA: Diagnosis not present

## 2015-09-10 DIAGNOSIS — R5382 Chronic fatigue, unspecified: Secondary | ICD-10-CM | POA: Diagnosis not present

## 2015-09-10 DIAGNOSIS — M545 Low back pain: Secondary | ICD-10-CM | POA: Diagnosis not present

## 2015-09-10 DIAGNOSIS — I110 Hypertensive heart disease with heart failure: Secondary | ICD-10-CM | POA: Diagnosis not present

## 2015-09-10 DIAGNOSIS — M2550 Pain in unspecified joint: Secondary | ICD-10-CM | POA: Diagnosis not present

## 2015-10-08 DIAGNOSIS — M545 Low back pain: Secondary | ICD-10-CM | POA: Diagnosis not present

## 2015-10-08 DIAGNOSIS — J45909 Unspecified asthma, uncomplicated: Secondary | ICD-10-CM | POA: Diagnosis not present

## 2015-10-08 DIAGNOSIS — M2550 Pain in unspecified joint: Secondary | ICD-10-CM | POA: Diagnosis not present

## 2015-10-08 DIAGNOSIS — I110 Hypertensive heart disease with heart failure: Secondary | ICD-10-CM | POA: Diagnosis not present

## 2016-03-11 ENCOUNTER — Ambulatory Visit: Payer: Medicare Other | Attending: Orthopedic Surgery | Admitting: Physical Therapy

## 2016-03-11 DIAGNOSIS — M25552 Pain in left hip: Secondary | ICD-10-CM | POA: Diagnosis not present

## 2016-03-11 DIAGNOSIS — R2689 Other abnormalities of gait and mobility: Secondary | ICD-10-CM | POA: Diagnosis present

## 2016-03-11 DIAGNOSIS — M6281 Muscle weakness (generalized): Secondary | ICD-10-CM | POA: Diagnosis present

## 2016-03-11 NOTE — Patient Instructions (Signed)
Clam    Lie on side, legs bent 90. Open top knee to ceiling, rotating leg outward. Do not roll backwards. Hold 10 seconds Repeat _10-20___ times. Do __2__ sessions per day.   Piriformis (Supine)  Cross legs, right on top. Gently pull other knee toward chest until stretch is felt in buttock/hip of top leg. Hold _30-60___ seconds. Repeat __3__ times per set. Do ____ sets per session. Do __2__ sessions per day.  Hip Stretch  Put right ankle over left knee. Let right knee fall downward, but keep ankle in place. Feel the stretch in hip. May push down gently with hand to feel stretch. Hold 30-60____ seconds while counting out loud.  Repeat 3____ times. Do _2___ sessions per day.  Butterfly, Supine    Lie on back, feet together. Lower knees toward floor. Hold _30-60__ seconds. Can do one leg at a time. Repeat 3___ times per session. Do _2__ sessions per day.  Copyright  VHI. All rights reserved.    Madelyn Flavors, PT 03/11/16 9:53 AM [facility] [address] [place], [place], [address] Phone: [phone]   Fax:  [phone]

## 2016-03-11 NOTE — Therapy (Signed)
[facility] [place], [place], [address] Phone: [phone]   Fax:  [phone]  Physical Therapy Evaluation  Patient Details  Name: Brandon Ferrell MRN: [id number] Date of Birth: 1955-05-13 Referring Provider: Earlie Server, MD  Encounter Date: 03/11/2016      PT End of Session - 03/11/16 0908    Visit Number 1   Number of Visits 12   Date for PT Re-Evaluation 04/22/16   PT Start Time 0908   PT Stop Time 1004   PT Time Calculation (min) 56 min   Activity Tolerance Patient tolerated treatment well   Behavior During Therapy [facility] for tasks assessed/performed      Past Medical History:  Diagnosis Date  . Acute renal insufficiency 07/18/2015  . Acute urinary retention 07/18/2015  . Arthritis   . COPD (chronic obstructive pulmonary disease) ([place])   . GERD (gastroesophageal reflux disease)    occ zantac  . Hypertension   . Palpitations   . Pneumonia    hx    Past Surgical History:  Procedure Laterality Date  . GROIN EXPLORATION Right 07/03/15   cyst removed- local anesthesia morehead  . LUNG BIOPSY  13   pneum, dx copd  . TOTAL HIP ARTHROPLASTY Right 11/07/2014  . TOTAL HIP ARTHROPLASTY Right 11/07/2014   Procedure: RIGHT  TOTAL HIP ARTHROPLASTY;  Surgeon: Earlie Server, MD;  Location: [place];  Service: Orthopedics;  Laterality: Right;  Would like to flip if possible    . TOTAL HIP ARTHROPLASTY Left 07/17/2015   Procedure: LEFT TOTAL HIP ARTHROPLASTY;  Surgeon: Earlie Server, MD;  Location: [place];  Service: Orthopedics;  Laterality: Left;  Marland Kitchen VASECTOMY  5/15    There were no vitals filed for this visit.       Subjective Assessment - 03/11/16 0911    Subjective Patient had L THR in 07/17/15 (patient states it was 07/08/15). During the surgery his ilia was fractured and since then has had weakness in the RLE and decreased balance. Patient had states initially it was a hairline fracture, but an xray last week and the  fracture has separated even more.    Pertinent History HTN, COPD, R THA 11/07/14; allergic to bee stings   Limitations Standing;Walking   How long can you stand comfortably? 30 min   How long can you walk comfortably? house to mailbox and back   Diagnostic tests xrays (see above)   Patient Stated Goals to get rid of pain and increase balance   Currently in Pain? Yes   Pain Score 7    Pain Location Hip   Pain Orientation Left   Pain Descriptors / Indicators Aching;Dull  intermittent sharp hot pain   Pain Type Surgical pain   Pain Onset More than a month ago   Pain Frequency Intermittent   Aggravating Factors  standing, walking   Pain Relieving Factors lying down   Effect of Pain on Daily Activities limited with ADLS            [facility] PT Assessment - 03/11/16 0001      Assessment   Medical Diagnosis s/p L THR   Referring Provider Earlie Server, MD   Onset Date/Surgical Date 07/18/15   Next MD Visit end of Dec     Precautions   Precautions Posterior Hip   Precaution Comments FRACTURED L ILIA. POSTERIOR HIP PRECAUTIONS BIL     Restrictions   Weight Bearing Restrictions No     Balance Screen   Has  the patient fallen in the past 6 months Yes   How many times? 1   Has the patient had a decrease in activity level because of a fear of falling?  No   Is the patient reluctant to leave their home because of a fear of falling?  No     Home Environment   Additional Comments has ramp to front door, one level home     Prior Function   Level of Independence Independent     Functional Tests   Functional tests Squat;Single leg stance     Squat   Comments shifts to RLE     Single Leg Stance   Comments 10 seconds LLE (shaky)     ROM / Strength   AROM / PROM / Strength Strength;AROM     AROM   Overall AROM Comments 60 deg L hip ABD supine (painful); else WFL; 15 deg IR and ER     Strength   Overall Strength Comments Able to perform L SLR with pain   Strength Assessment  Site Hip   Right/Left Hip Left   Left Hip Flexion 4/5  leans backward   Left Hip Extension 4+/5   Left Hip ABduction 4+/5  in available range     Flexibility   Soft Tissue Assessment /Muscle Length yes   Hamstrings marked tightness left HS    Piriformis mod tightness L and hip ADD     Palpation   Palpation comment tender in left gluteals, piriformis, TFL and ITB     Ambulation/Gait   Ambulation/Gait Yes   Ambulation Distance (Feet) 30 Feet   Gait Pattern Decreased stance time - left;Decreased step length - right;Lateral trunk lean to left                   [facility] Adult PT Treatment/Exercise - 03/11/16 0001      Modalities   Modalities Electrical Stimulation;Moist Heat     Moist Heat Therapy   Number Minutes Moist Heat 15 Minutes   Moist Heat Location Hip     Electrical Stimulation   Electrical Stimulation Location Premod to L glut med and ITB 80-150 Hz x 15 min   Electrical Stimulation Goals Pain                PT Education - 03/11/16 1108    Education provided Yes   Education Details HEP   Person(s) Educated Patient   Methods Explanation;Demonstration;Handout;Verbal cues   Comprehension Verbalized understanding;Returned demonstration             PT Long Term Goals - 03/11/16 1115      PT LONG TERM GOAL #1   Title I with HEP   Time 6   Period Weeks   Status New     PT LONG TERM GOAL #2   Title Patient to ambulate with a normal gait pattern with 50% less pain or more.   Time 6   Period Weeks   Status New     PT LONG TERM GOAL #3   Title Patient able to perform functional squat with equal weightbearing.   Time 6   Period Weeks   Status New     PT LONG TERM GOAL #4   Title Patient able to perform ADLS with [id number] pain or less.   Time 6   Period Weeks   Status New     PT LONG TERM GOAL #5   Title Patient to demo 5/5 L hip strength to improve  function.   Time 6   Period Weeks   Status New               Plan -  03/11/16 1109    Clinical Impression Statement Patient presents with hip weakness and decreased balance secondary to a fracture in his L ilia which occurred when his L hip was replaced on 07/18/15. He has decreased ROM and strength in the left hip and functional weakness and balance deficits. He has had one fall which occurred when he stepped on something small and his leg gave way.    Rehab Potential Excellent   PT Frequency 2x / week   PT Duration 6 weeks   PT Treatment/Interventions ADLs/Self Care Home Management;Electrical Stimulation;Cryotherapy;Moist Heat;Therapeutic exercise;Balance training;Neuromuscular re-education;Patient/family education;Manual techniques;Dry needling;Gait training;Stair training   PT Next Visit Plan L hip strengthening, balance; modalities for pain.   PT Home Exercise Plan SDLY clam (long holds); stretches: butterfly and piriformis   Consulted and Agree with Plan of Care Patient      Patient will benefit from skilled therapeutic intervention in order to improve the following deficits and impairments:  Abnormal gait, Decreased range of motion, Pain, Decreased strength, Impaired flexibility  Visit Diagnosis: Pain in left hip - Plan: PT plan of care cert/re-cert  Muscle weakness (generalized) - Plan: PT plan of care cert/re-cert  Other abnormalities of gait and mobility - Plan: PT plan of care cert/re-cert     Problem List Patient Active Problem List   Diagnosis Date Noted  . Acute urinary retention 07/18/2015  . Acute renal insufficiency 07/18/2015  . Degenerative joint disease of left hip 07/17/2015  . Primary localized osteoarthritis of right hip 11/07/2014  . Hyperlipidemia 06/21/2006  . TOBACCO ABUSE 06/21/2006  . PRESBYOPIA 06/21/2006  . Essential hypertension 06/21/2006  . ABSCESS, GROIN 06/21/2006  . INSOMNIA 06/21/2006    Madelyn Flavors PT 03/11/2016, 11:25 AM  [facility] [address] [place], [place], [address] Phone: [phone]   Fax:  [phone]  Name: Brandon Ferrell MRN: [id number] Date of Birth: 06-Nov-1955

## 2016-03-15 ENCOUNTER — Ambulatory Visit: Payer: Medicare Other | Admitting: *Deleted

## 2016-03-15 DIAGNOSIS — M25552 Pain in left hip: Secondary | ICD-10-CM

## 2016-03-15 DIAGNOSIS — R2689 Other abnormalities of gait and mobility: Secondary | ICD-10-CM

## 2016-03-15 DIAGNOSIS — M6281 Muscle weakness (generalized): Secondary | ICD-10-CM

## 2016-03-15 NOTE — Therapy (Signed)
[facility] [place], [place], [address] Phone: [phone]   Fax:  [phone]  Physical Therapy Treatment  Patient Details  Name: Brandon Ferrell MRN: [id number] Date of Birth: 01/17/1956 Referring Provider: Earlie Server, MD  Encounter Date: 03/15/2016      PT End of Session - 03/15/16 0935    Visit Number 2   Number of Visits 12   Date for PT Re-Evaluation 04/22/16   PT Start Time 0904   PT Stop Time 0954   PT Time Calculation (min) 50 min      Past Medical History:  Diagnosis Date  . Acute renal insufficiency 07/18/2015  . Acute urinary retention 07/18/2015  . Arthritis   . COPD (chronic obstructive pulmonary disease) ([place])   . GERD (gastroesophageal reflux disease)    occ zantac  . Hypertension   . Palpitations   . Pneumonia    hx    Past Surgical History:  Procedure Laterality Date  . GROIN EXPLORATION Right 07/03/15   cyst removed- local anesthesia morehead  . LUNG BIOPSY  13   pneum, dx copd  . TOTAL HIP ARTHROPLASTY Right 11/07/2014  . TOTAL HIP ARTHROPLASTY Right 11/07/2014   Procedure: RIGHT  TOTAL HIP ARTHROPLASTY;  Surgeon: Earlie Server, MD;  Location: [place];  Service: Orthopedics;  Laterality: Right;  Would like to flip if possible    . TOTAL HIP ARTHROPLASTY Left 07/17/2015   Procedure: LEFT TOTAL HIP ARTHROPLASTY;  Surgeon: Earlie Server, MD;  Location: [place];  Service: Orthopedics;  Laterality: Left;  Marland Kitchen VASECTOMY  5/15    There were no vitals filed for this visit.      Subjective Assessment - 03/15/16 0921    Subjective Patient had L THR in 07/17/15 (patient states it was 07/08/15). During the surgery his ilia was fractured and since then has had weakness in the RLE and decreased balance. Patient had states initially it was a hairline fracture, but an xray last week and the fracture has separated even more.    Pertinent History HTN, COPD, R THA 11/07/14; allergic to bee stings   Limitations Standing;Walking   How long can you stand comfortably? 30 min   How long can you walk comfortably? house to mailbox and back   Diagnostic tests xrays (see above)   Patient Stated Goals to get rid of pain and increase balance                         [facility] Adult PT Treatment/Exercise - 03/15/16 0001      Exercises   Exercises Knee/Hip     Knee/Hip Exercises: Aerobic   Nustep x 10 mins 827 steps seat 11     Knee/Hip Exercises: Standing   Hip Abduction 5 sets  3x10-15   Abduction Limitations 2#   Rocker Board 5 minutes  balance  F/B and S/S   Other Standing Knee Exercises balance on inverted BOSU x 5 mins     Modalities   Modalities Electrical Stimulation;Moist Heat     Moist Heat Therapy   Number Minutes Moist Heat 15 Minutes   Moist Heat Location Hip     Electrical Stimulation   Electrical Stimulation Location Premod to L glut med and ITB 80-150 Hz x 15 min   Electrical Stimulation Goals Pain                     PT Long Term Goals - 03/11/16  1115      PT LONG TERM GOAL #1   Title I with HEP   Time 6   Period Weeks   Status New     PT LONG TERM GOAL #2   Title Patient to ambulate with a normal gait pattern with 50% less pain or more.   Time 6   Period Weeks   Status New     PT LONG TERM GOAL #3   Title Patient able to perform functional squat with equal weightbearing.   Time 6   Period Weeks   Status New     PT LONG TERM GOAL #4   Title Patient able to perform ADLS with 4-8/27 pain or less.   Time 6   Period Weeks   Status New     PT LONG TERM GOAL #5   Title Patient to demo 5/5 L hip strength to improve function.   Time 6   Period Weeks   Status New               Plan - 03/15/16 0922    Clinical Impression Statement PT DID FAIRLY WELL TODAY AND WAS ABLE TO COMPLETE SOME STRENGTHENING EXS AND BALANCE ACT.'S FOR LT HIP WITH MINIMAL PAIN INCREASE. NO LTG'S MET YET DUE TO ROM AND STRENGTH DEFICITS    Rehab Potential Excellent   PT Frequency 2x / week   PT Duration 6 weeks   PT Treatment/Interventions ADLs/Self Care Home Management;Electrical Stimulation;Cryotherapy;Moist Heat;Therapeutic exercise;Balance training;Neuromuscular re-education;Patient/family education;Manual techniques;Dry needling;Gait training;Stair training   PT Next Visit Plan L hip strengthening, balance; modalities for pain.   PT Home Exercise Plan SDLY clam (long holds); stretches: butterfly and piriformis   Consulted and Agree with Plan of Care Patient      Patient will benefit from skilled therapeutic intervention in order to improve the following deficits and impairments:     Visit Diagnosis: Pain in left hip  Muscle weakness (generalized)  Other abnormalities of gait and mobility     Problem List Patient Active Problem List   Diagnosis Date Noted  . Acute urinary retention 07/18/2015  . Acute renal insufficiency 07/18/2015  . Degenerative joint disease of left hip 07/17/2015  . Primary localized osteoarthritis of right hip 11/07/2014  . Hyperlipidemia 06/21/2006  . TOBACCO ABUSE 06/21/2006  . PRESBYOPIA 06/21/2006  . Essential hypertension 06/21/2006  . ABSCESS, GROIN 06/21/2006  . INSOMNIA 06/21/2006    RAMSEUR,CHRIS, PTA  [facility] [address] [place], [place], [address] Phone: [phone]   Fax:  [phone]  Name: Brandon Ferrell MRN: [id number] Date of Birth: 05-16-1955

## 2016-03-18 ENCOUNTER — Encounter: Payer: Medicare Other | Admitting: *Deleted

## 2016-03-22 ENCOUNTER — Ambulatory Visit: Payer: Medicare Other | Admitting: *Deleted

## 2016-03-22 DIAGNOSIS — M25552 Pain in left hip: Secondary | ICD-10-CM

## 2016-03-22 DIAGNOSIS — M6281 Muscle weakness (generalized): Secondary | ICD-10-CM

## 2016-03-22 NOTE — Therapy (Signed)
[facility] [place], [place], [address] Phone: [phone]   Fax:  [phone]  Physical Therapy Treatment  Patient Details  Name: Brandon Ferrell MRN: [id number] Date of Birth: 09/01/1955 Referring Provider: Earlie Server, MD  Encounter Date: 03/22/2016      PT End of Session - 03/22/16 0843    Visit Number 3   Number of Visits 12   Date for PT Re-Evaluation 04/22/16   PT Start Time 0815   PT Stop Time [id number]   PT Time Calculation (min) 40 min      Past Medical History:  Diagnosis Date  . Acute renal insufficiency 07/18/2015  . Acute urinary retention 07/18/2015  . Arthritis   . COPD (chronic obstructive pulmonary disease) ([place])   . GERD (gastroesophageal reflux disease)    occ zantac  . Hypertension   . Palpitations   . Pneumonia    hx    Past Surgical History:  Procedure Laterality Date  . GROIN EXPLORATION Right 07/03/15   cyst removed- local anesthesia morehead  . LUNG BIOPSY  13   pneum, dx copd  . TOTAL HIP ARTHROPLASTY Right 11/07/2014  . TOTAL HIP ARTHROPLASTY Right 11/07/2014   Procedure: RIGHT  TOTAL HIP ARTHROPLASTY;  Surgeon: Earlie Server, MD;  Location: [place];  Service: Orthopedics;  Laterality: Right;  Would like to flip if possible    . TOTAL HIP ARTHROPLASTY Left 07/17/2015   Procedure: LEFT TOTAL HIP ARTHROPLASTY;  Surgeon: Earlie Server, MD;  Location: [place];  Service: Orthopedics;  Laterality: Left;  Marland Kitchen VASECTOMY  5/15    There were no vitals filed for this visit.      Subjective Assessment - 03/22/16 0822    Subjective Balance is doing a little better, but still have constant pain LT Hip. Need to leave early. Can we do nustep and heat and Estim.   Pertinent History HTN, COPD, R THA 11/07/14; allergic to bee stings   Limitations Standing;Walking   How long can you stand comfortably? 30 min   How long can you walk comfortably? house to mailbox and back   Diagnostic tests xrays (see  above)   Patient Stated Goals to get rid of pain and increase balance   Currently in Pain? Yes   Pain Score 6    Pain Location Hip   Pain Orientation Left   Pain Descriptors / Indicators Aching   Pain Type Surgical pain   Pain Onset More than a month ago   Pain Frequency Intermittent                         [facility] Adult PT Treatment/Exercise - 03/22/16 0001      Exercises   Exercises Knee/Hip     Knee/Hip Exercises: Aerobic   Nustep nustep x 15 mins L6 seat 11, 1010 steps     Modalities   Modalities Electrical Stimulation;Moist Heat     Moist Heat Therapy   Number Minutes Moist Heat 15 Minutes   Moist Heat Location Hip     Electrical Stimulation   Electrical Stimulation Location Premod to L glut med and ITB 80-150 Hz x 15 min   Electrical Stimulation Goals Pain                     PT Long Term Goals - 03/11/16 1115      PT LONG TERM GOAL #1   Title I with HEP   Time  6   Period Weeks   Status New     PT LONG TERM GOAL #2   Title Patient to ambulate with a normal gait pattern with 50% less pain or more.   Time 6   Period Weeks   Status New     PT LONG TERM GOAL #3   Title Patient able to perform functional squat with equal weightbearing.   Time 6   Period Weeks   Status New     PT LONG TERM GOAL #4   Title Patient able to perform ADLS with [id number] pain or less.   Time 6   Period Weeks   Status New     PT LONG TERM GOAL #5   Title Patient to demo 5/5 L hip strength to improve function.   Time 6   Period Weeks   Status New               Plan - 03/22/16 [id number]    Clinical Impression Statement Pt requested a shortened Rx today due to having another appt. He did well with today's Rx and will be back tomorrow and should resume balance act.'s. Normal modality response   Rehab Potential Excellent   PT Frequency 2x / week   PT Duration 6 weeks   PT Treatment/Interventions ADLs/Self Care Home Management;Electrical  Stimulation;Cryotherapy;Moist Heat;Therapeutic exercise;Balance training;Neuromuscular re-education;Patient/family education;Manual techniques;Dry needling;Gait training;Stair training   PT Next Visit Plan L hip strengthening, balance; modalities for pain.   PT Home Exercise Plan SDLY clam (long holds); stretches: butterfly and piriformis   Consulted and Agree with Plan of Care Patient      Patient will benefit from skilled therapeutic intervention in order to improve the following deficits and impairments:  Abnormal gait, Decreased range of motion, Pain, Decreased strength, Impaired flexibility  Visit Diagnosis: Pain in left hip  Muscle weakness (generalized)     Problem List Patient Active Problem List   Diagnosis Date Noted  . Acute urinary retention 07/18/2015  . Acute renal insufficiency 07/18/2015  . Degenerative joint disease of left hip 07/17/2015  . Primary localized osteoarthritis of right hip 11/07/2014  . Hyperlipidemia 06/21/2006  . TOBACCO ABUSE 06/21/2006  . PRESBYOPIA 06/21/2006  . Essential hypertension 06/21/2006  . ABSCESS, GROIN 06/21/2006  . INSOMNIA 06/21/2006    Talecia Sherlin,CHRIS, PTA 03/22/2016, 9:09 AM  [facility] [place], [place], [address] Phone: [phone]   Fax:  [phone]  Name: Brandon Ferrell MRN: [id number] Date of Birth: 06-23-1955

## 2016-03-23 ENCOUNTER — Ambulatory Visit: Payer: Medicare Other | Admitting: Physical Therapy

## 2016-03-31 ENCOUNTER — Ambulatory Visit: Payer: Medicare Other | Admitting: *Deleted

## 2016-03-31 DIAGNOSIS — M25552 Pain in left hip: Secondary | ICD-10-CM

## 2016-03-31 DIAGNOSIS — M6281 Muscle weakness (generalized): Secondary | ICD-10-CM

## 2016-03-31 NOTE — Therapy (Addendum)
[facility] [place], [place], [address] Phone: [phone]   Fax:  [phone]  Physical Therapy Treatment  Patient Details  Name: Brandon Ferrell MRN: [id number] Date of Birth: July 27, 1955 Referring Provider: Earlie Server, MD  Encounter Date: 03/31/2016      PT End of Session - 03/31/16 0910    Visit Number 4   Number of Visits 12   Date for PT Re-Evaluation 04/22/16   PT Start Time 0900   PT Stop Time [id number]  Rx limited as per Pt. MD appt at 10:00   PT Time Calculation (min) 35 min      Past Medical History:  Diagnosis Date  . Acute renal insufficiency 07/18/2015  . Acute urinary retention 07/18/2015  . Arthritis   . COPD (chronic obstructive pulmonary disease) ([place])   . GERD (gastroesophageal reflux disease)    occ zantac  . Hypertension   . Palpitations   . Pneumonia    hx    Past Surgical History:  Procedure Laterality Date  . GROIN EXPLORATION Right 07/03/15   cyst removed- local anesthesia morehead  . LUNG BIOPSY  13   pneum, dx copd  . TOTAL HIP ARTHROPLASTY Right 11/07/2014  . TOTAL HIP ARTHROPLASTY Right 11/07/2014   Procedure: RIGHT  TOTAL HIP ARTHROPLASTY;  Surgeon: Earlie Server, MD;  Location: [place];  Service: Orthopedics;  Laterality: Right;  Would like to flip if possible    . TOTAL HIP ARTHROPLASTY Left 07/17/2015   Procedure: LEFT TOTAL HIP ARTHROPLASTY;  Surgeon: Earlie Server, MD;  Location: [place];  Service: Orthopedics;  Laterality: Left;  Marland Kitchen VASECTOMY  5/15    There were no vitals filed for this visit.      Subjective Assessment - 03/31/16 0909    Subjective LT hip really hurt over Christmas. I still can't balance on it to put my pants on. Need to leave by 9:35 for MD appt.   Pertinent History HTN, COPD, R THA 11/07/14; allergic to bee stings   Limitations Standing;Walking   How long can you stand comfortably? 30 min   How long can you walk comfortably? house to mailbox and back   Diagnostic tests xrays (see above)   Patient Stated Goals to get rid of pain and increase balance   Currently in Pain? Yes   Pain Score 7    Pain Location Hip   Pain Orientation Left   Pain Descriptors / Indicators Aching   Pain Type Surgical pain   Pain Onset More than a month ago   Pain Frequency Intermittent                         [facility] Adult PT Treatment/Exercise - 03/31/16 0001      Exercises   Exercises Knee/Hip     Knee/Hip Exercises: Aerobic   Nustep nustep x 15 mins L6 seat 11, 1010 steps     Modalities   Modalities Electrical Stimulation;Moist Heat     Moist Heat Therapy   Number Minutes Moist Heat 15 Minutes   Moist Heat Location Hip     Electrical Stimulation   Electrical Stimulation Location Premod to L glut med and ITB 80-150 Hz x 15 min   Electrical Stimulation Goals Pain                     PT Long Term Goals - 03/31/16 0945      PT LONG TERM GOAL #1  Title I with HEP   Time 6   Period Weeks   Status On-going     PT LONG TERM GOAL #2   Title Patient to ambulate with a normal gait pattern with 50% less pain or more.   Time 6   Period Weeks   Status On-going     PT LONG TERM GOAL #3   Title Patient able to perform functional squat with equal weightbearing.   Time 6   Period Weeks   Status On-going     PT LONG TERM GOAL #4   Time 6   Period Weeks   Status On-going     PT LONG TERM GOAL #5   Title Patient to demo 5/5 L hip strength to improve function.   Time 6   Period Weeks   Status On-going               Plan - 03/31/16 0946    Clinical Impression Statement Limited Rx again due to Pt having an MD appt and needed to leave by 9:35. He has had increased pain over the last week (7/10). He was unable to meet any LTGs due to pain and weakness   Rehab Potential Excellent   PT Frequency 2x / week   PT Duration 6 weeks   PT Treatment/Interventions ADLs/Self Care Home Management;Electrical  Stimulation;Cryotherapy;Moist Heat;Therapeutic exercise;Balance training;Neuromuscular re-education;Patient/family education;Manual techniques;Dry needling;Gait training;Stair training   PT Next Visit Plan L hip strengthening, balance; modalities for pain.   PT Home Exercise Plan SDLY clam (long holds); stretches: butterfly and piriformis   Consulted and Agree with Plan of Care Patient      Patient will benefit from skilled therapeutic intervention in order to improve the following deficits and impairments:  Abnormal gait, Decreased range of motion, Pain, Decreased strength, Impaired flexibility  Visit Diagnosis: Pain in left hip  Muscle weakness (generalized)     Problem List Patient Active Problem List   Diagnosis Date Noted  . Acute urinary retention 07/18/2015  . Acute renal insufficiency 07/18/2015  . Degenerative joint disease of left hip 07/17/2015  . Primary localized osteoarthritis of right hip 11/07/2014  . Hyperlipidemia 06/21/2006  . TOBACCO ABUSE 06/21/2006  . PRESBYOPIA 06/21/2006  . Essential hypertension 06/21/2006  . ABSCESS, GROIN 06/21/2006  . INSOMNIA 06/21/2006    RAMSEUR,CHRIS, PTA 03/31/2016, 10:00 AM  [facility] [place], [place], [address] Phone: [phone]   Fax:  [phone]  Name: Brandon Ferrell MRN: [id number] Date of Birth: June 05, 1955   PHYSICAL THERAPY DISCHARGE SUMMARY  Visits from Start of Care: 4  Current functional level related to goals / functional outcomes: See goal section.   Remaining deficits: Pt did not return to PT   Education / Equipment: HEP Plan: Patient agrees to discharge.  Patient goals were not met. Patient is being discharged due to not returning since the last visit.  ?????        Mali Applegate MPT

## 2016-04-05 ENCOUNTER — Ambulatory Visit: Payer: Medicare Other | Attending: Orthopedic Surgery | Admitting: Physical Therapy

## 2016-08-01 ENCOUNTER — Ambulatory Visit (INDEPENDENT_AMBULATORY_CARE_PROVIDER_SITE_OTHER): Payer: Medicare Other | Admitting: Otolaryngology

## 2016-08-01 DIAGNOSIS — R490 Dysphonia: Secondary | ICD-10-CM

## 2016-08-01 DIAGNOSIS — K219 Gastro-esophageal reflux disease without esophagitis: Secondary | ICD-10-CM | POA: Diagnosis not present

## 2016-08-01 DIAGNOSIS — F17210 Nicotine dependence, cigarettes, uncomplicated: Secondary | ICD-10-CM | POA: Diagnosis not present

## 2016-09-12 ENCOUNTER — Ambulatory Visit (INDEPENDENT_AMBULATORY_CARE_PROVIDER_SITE_OTHER): Payer: Medicare Other | Admitting: Otolaryngology

## 2017-01-27 ENCOUNTER — Encounter (INDEPENDENT_AMBULATORY_CARE_PROVIDER_SITE_OTHER): Payer: Self-pay | Admitting: *Deleted

## 2017-03-07 IMAGING — CR DG HIP (WITH OR WITHOUT PELVIS) 1V PORT*R*
2 series · 2 of 2 positions shown · non-contrast
Comparison: None.

CLINICAL DATA: Post right total hip replacement

EXAM:
DG HIP (WITH OR WITHOUT PELVIS) 1V PORT RIGHT

[AP]
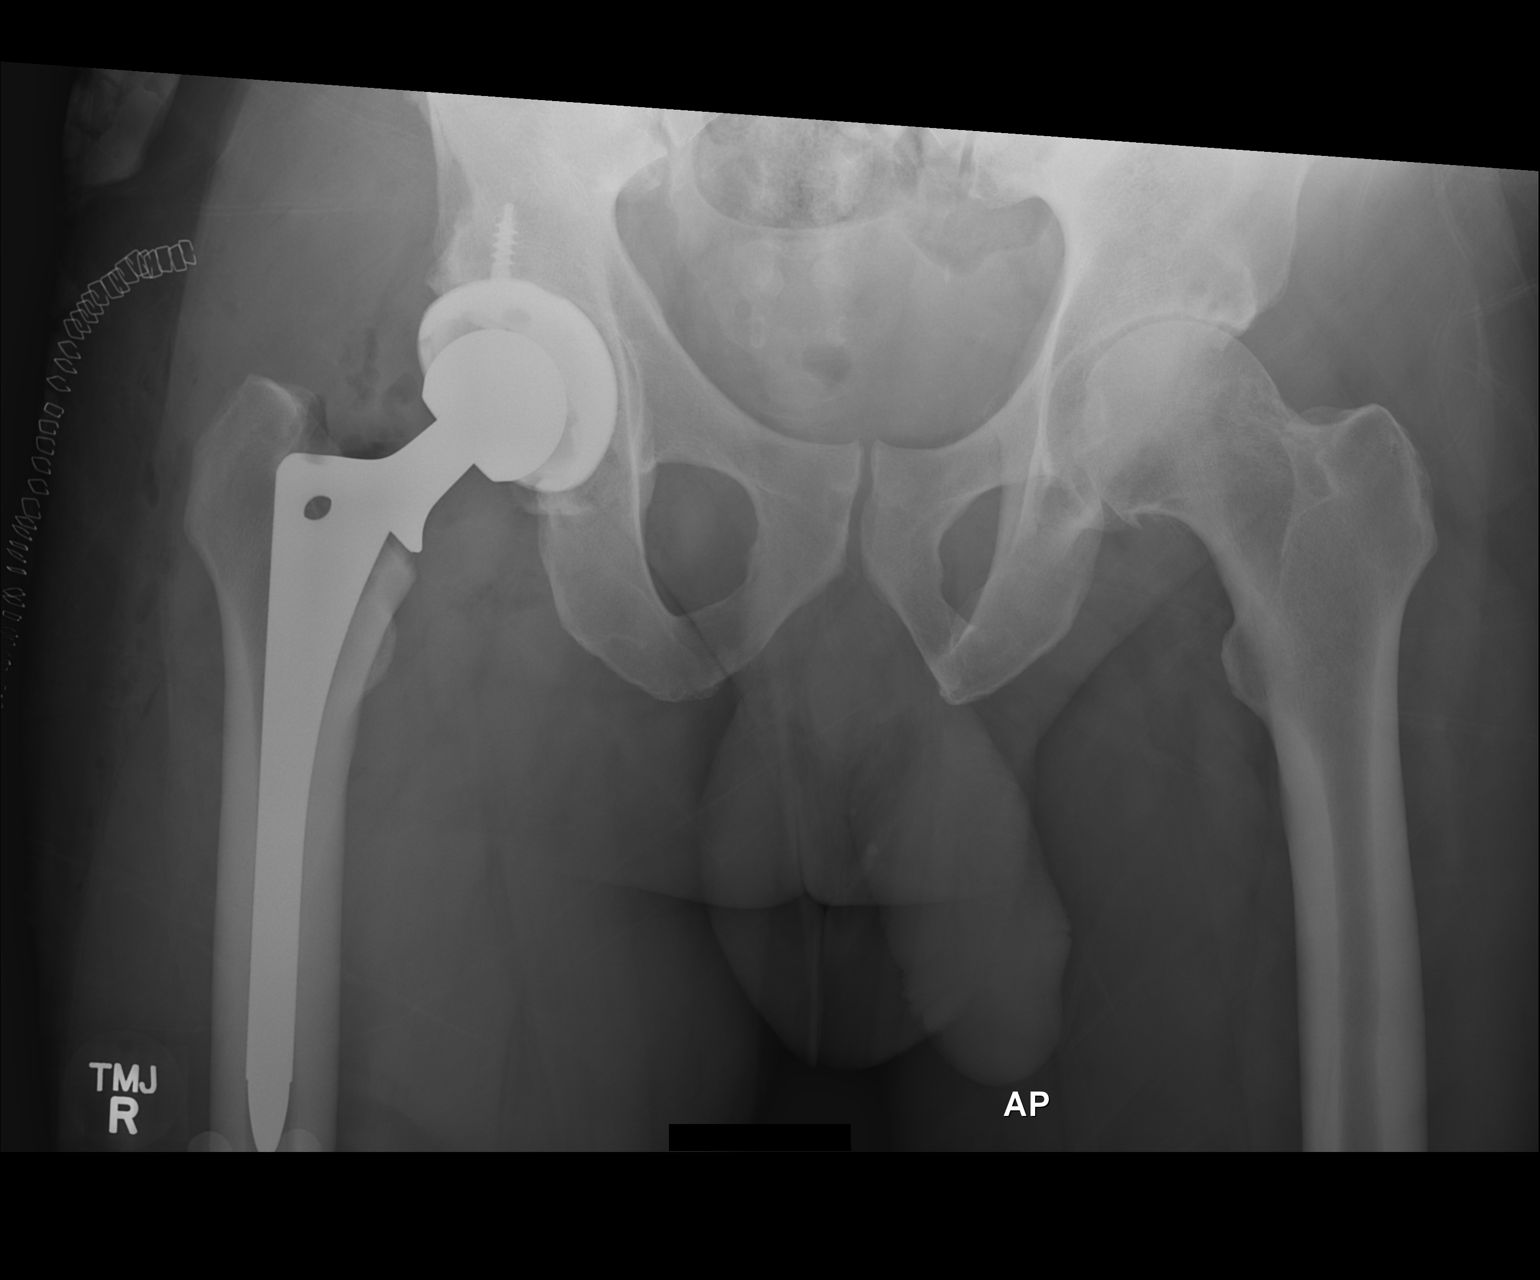

[lateral]
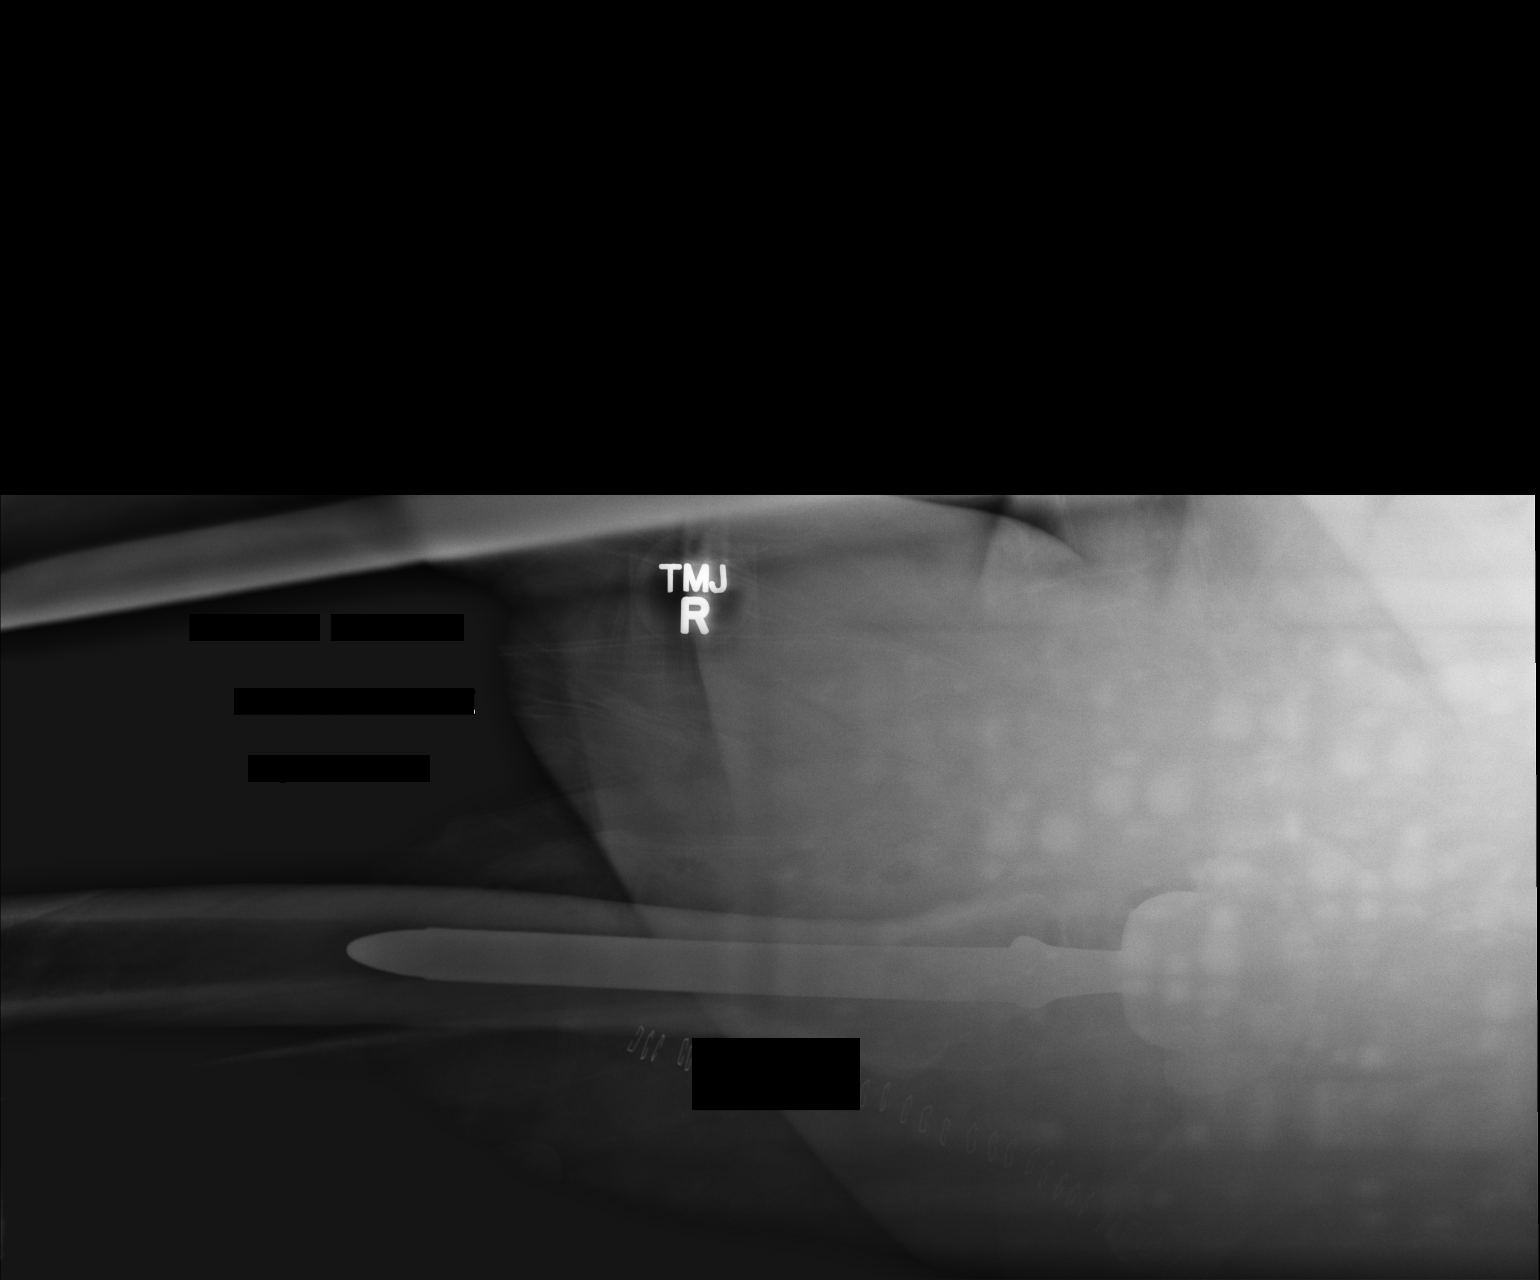

[2 of 2 positions shown; findings below may reference images not displayed]

FINDINGS: Right hip replacement. Normal alignment. No hardware or bony
complicating feature. Moderate degenerative changes in the left hip.
IMPRESSION: Right hip replacement without complicating feature.

## 2017-05-18 ENCOUNTER — Other Ambulatory Visit (HOSPITAL_COMMUNITY): Payer: Self-pay | Admitting: Family Medicine

## 2017-05-18 DIAGNOSIS — M542 Cervicalgia: Secondary | ICD-10-CM

## 2017-05-22 ENCOUNTER — Ambulatory Visit (HOSPITAL_COMMUNITY)
Admission: RE | Admit: 2017-05-22 | Discharge: 2017-05-22 | Disposition: A | Discharge status: Home / Self Care | Payer: Medicare Other | Source: Ambulatory Visit | Attending: Family Medicine | Admitting: Family Medicine

## 2017-05-22 DIAGNOSIS — M542 Cervicalgia: Secondary | ICD-10-CM

## 2017-05-22 DIAGNOSIS — M4802 Spinal stenosis, cervical region: Secondary | ICD-10-CM | POA: Insufficient documentation

## 2017-05-22 DIAGNOSIS — M50321 Other cervical disc degeneration at C4-C5 level: Secondary | ICD-10-CM | POA: Diagnosis not present

## 2017-05-29 ENCOUNTER — Encounter: Payer: Self-pay | Admitting: Internal Medicine

## 2017-06-15 ENCOUNTER — Encounter: Payer: Self-pay | Admitting: Internal Medicine

## 2017-07-14 IMAGING — DX DG SHOULDER 2+V*L*
3 series · 3 of 3 positions shown · non-contrast
Comparison: 07/13/2006 .

CLINICAL DATA: Work injury years ago.  Pain.  Initial evaluation.

EXAM:
LEFT SHOULDER - 2+ VIEW

[shoulder ap]
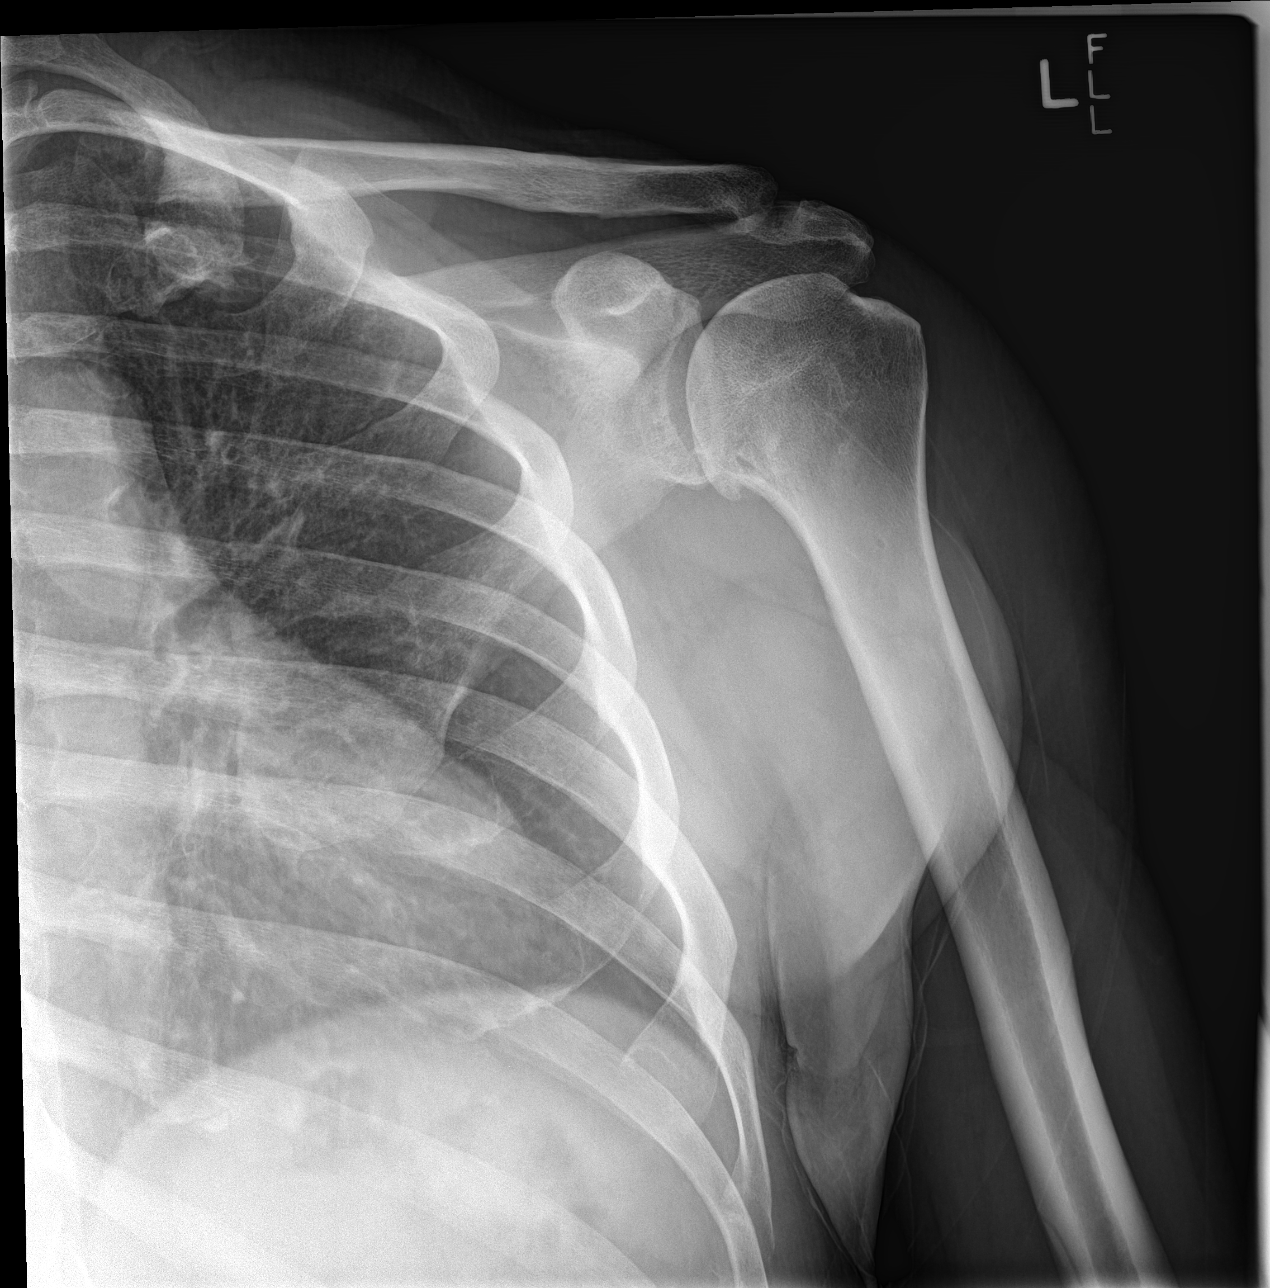

[shoulder y view]
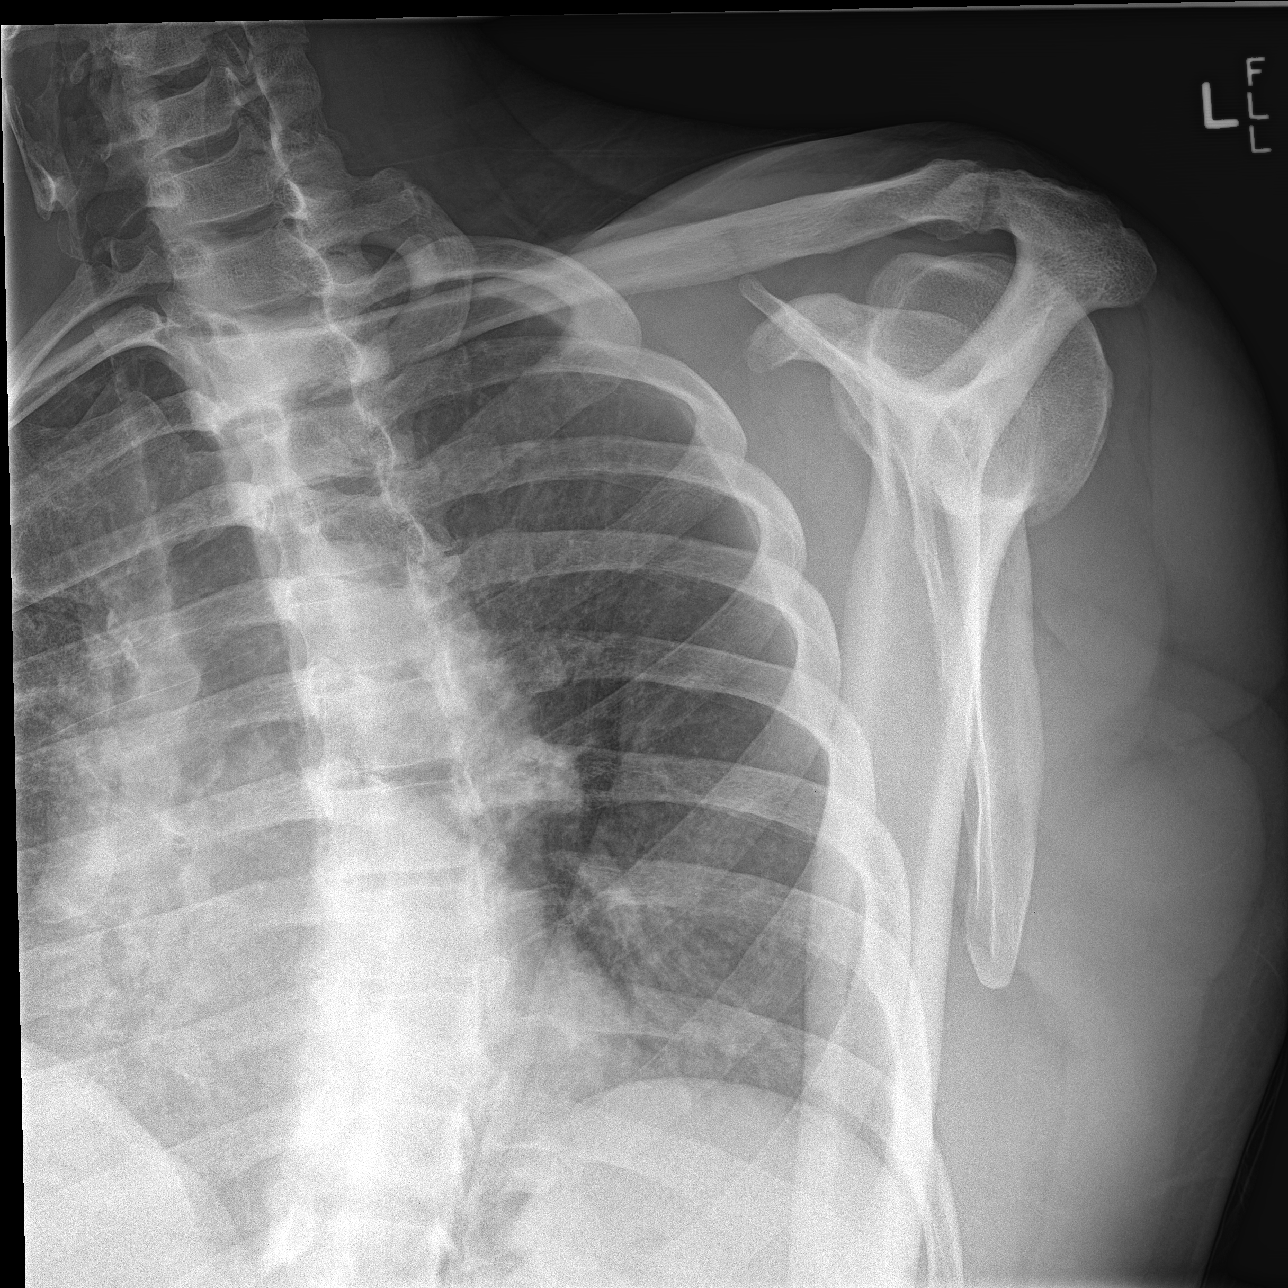

[shoulder axillary]
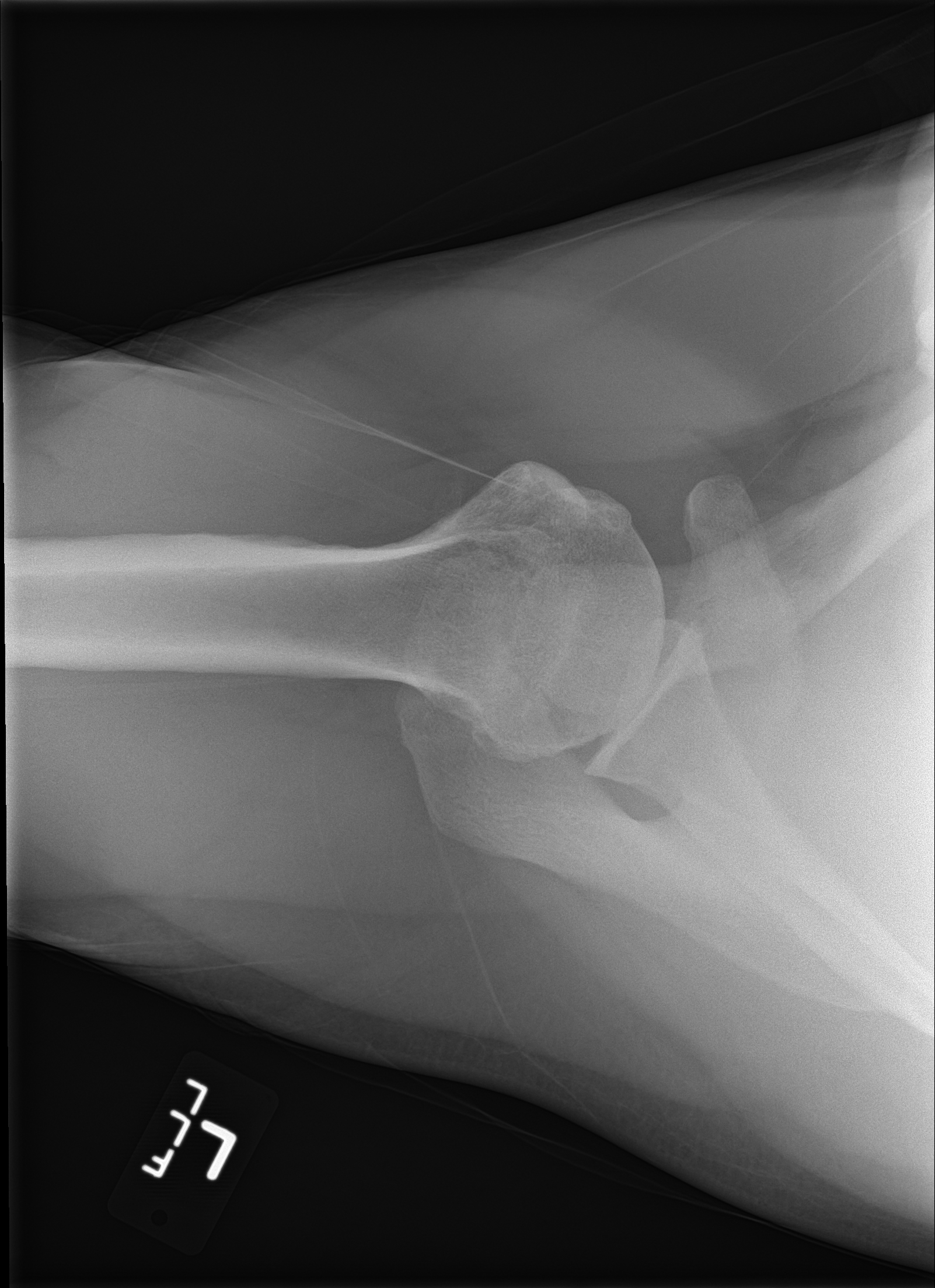

[3 of 3 positions shown; findings below may reference images not displayed]

FINDINGS: Acromioclavicular and glenohumeral degenerative change. No evidence
of fracture or dislocation.
IMPRESSION: No acute abnormality. Acromioclavicular and glenohumeral
degenerative change.

## 2017-08-08 ENCOUNTER — Ambulatory Visit (AMBULATORY_SURGERY_CENTER): Payer: Self-pay | Admitting: *Deleted

## 2017-08-08 ENCOUNTER — Other Ambulatory Visit: Payer: Self-pay

## 2017-08-08 VITALS — Ht 71.0 in | Wt 230.0 lb

## 2017-08-08 DIAGNOSIS — Z1211 Encounter for screening for malignant neoplasm of colon: Secondary | ICD-10-CM

## 2017-08-08 MED ORDER — NA SULFATE-K SULFATE-MG SULF 17.5-3.13-1.6 GM/177ML PO SOLN: 1.0000 | Freq: Once | ORAL | 0 refills | Status: AC

## 2017-08-08 NOTE — Progress Notes (Signed)
No egg or soy allergy known to patient  No issues with past sedation with any surgeries  or procedures, no intubation problems  No diet pills per patient No home 02 use per patient  No blood thinners per patient  Pt denies issues with constipation  No A fib or A flutter  EMMI video sent to pt's e mail  

## 2017-08-22 ENCOUNTER — Ambulatory Visit (AMBULATORY_SURGERY_CENTER): Payer: Medicare Other | Admitting: Internal Medicine

## 2017-08-22 ENCOUNTER — Other Ambulatory Visit: Payer: Self-pay

## 2017-08-22 ENCOUNTER — Encounter: Payer: Self-pay | Admitting: Internal Medicine

## 2017-08-22 VITALS — BP 130/78 | HR 59 | Temp 99.6°F | Resp 16 | Ht 71.0 in | Wt 230.0 lb

## 2017-08-22 DIAGNOSIS — D123 Benign neoplasm of transverse colon: Secondary | ICD-10-CM | POA: Diagnosis not present

## 2017-08-22 DIAGNOSIS — Z1211 Encounter for screening for malignant neoplasm of colon: Secondary | ICD-10-CM

## 2017-08-22 DIAGNOSIS — D122 Benign neoplasm of ascending colon: Secondary | ICD-10-CM | POA: Diagnosis not present

## 2017-08-22 MED ORDER — SODIUM CHLORIDE 0.9 % IV SOLN: 500.0000 mL | Freq: Once | INTRAVENOUS | Status: DC

## 2017-08-22 NOTE — Op Note (Signed)
[place] Patient Name: Brandon Ferrell Procedure Date: 08/22/2017 9:56 AM MRN: [id number] Endoscopist: Gatha Mayer , MD Age: 62 Referring MD:  Date of Birth: [birth year] Gender: Male Account #: [id number] Procedure:                Colonoscopy Indications:              Screening for colorectal malignant neoplasm, Last                            colonoscopy: 2009 Medicines:                Propofol per Anesthesia, Monitored Anesthesia Care Procedure:                Pre-Anesthesia Assessment:                           - Prior to the procedure, a History and Physical                            was performed, and patient medications and                            allergies were reviewed. The patient's tolerance of                            previous anesthesia was also reviewed. The risks                            and benefits of the procedure and the sedation                            options and risks were discussed with the patient.                            All questions were answered, and informed consent                            was obtained. Prior Anticoagulants: The patient has                            taken no previous anticoagulant or antiplatelet                            agents. ASA Grade Assessment: II - A patient with                            mild systemic disease. After reviewing the risks                            and benefits, the patient was deemed in                            satisfactory condition to undergo the procedure.  After obtaining informed consent, the colonoscope                            was passed under direct vision. Throughout the                            procedure, the patient's blood pressure, pulse, and                            oxygen saturations were monitored continuously. The                            Colonoscope was introduced through the anus and                            advanced to the the  cecum, identified by                            appendiceal orifice and ileocecal valve. The                            colonoscopy was performed without difficulty. The                            patient tolerated the procedure well. The quality                            of the bowel preparation was excellent. The                            ileocecal valve, appendiceal orifice, and rectum                            were photographed. Scope In: 10:06:30 AM Scope Out: 10:24:02 AM Scope Withdrawal Time: 0 hours 13 minutes 43 seconds  Total Procedure Duration: 0 hours 17 minutes 32 seconds  Findings:                 The perianal and digital rectal examinations were                            normal. Pertinent negatives include normal prostate                            (size, shape, and consistency).                           Three sessile polyps were found in the transverse                            colon and ascending colon. The polyps were                            diminutive in size. These polyps were removed with  a cold snare. Resection and retrieval were                            complete. Verification of patient identification                            for the specimen was done. Estimated blood loss was                            minimal.                           Internal hemorrhoids were found during retroflexion.                           The exam was otherwise without abnormality on                            direct and retroflexion views. Complications:            No immediate complications. Estimated Blood Loss:     Estimated blood loss was minimal. Impression:               - No specimens collected. Recommendation:           - Patient has a contact number available for                            emergencies. The signs and symptoms of potential                            delayed complications were discussed with the                             patient. Return to normal activities tomorrow.                            Written discharge instructions were provided to the                            patient.                           - Resume previous diet.                           - Continue present medications.                           - Repeat colonoscopy is recommended. The                            colonoscopy date will be determined after pathology                            results from today's exam become available for  review.                           - Hemorrhoid banding if desired Gatha Mayer, MD 08/22/2017 10:35:17 AM This report has been signed electronically.

## 2017-08-22 NOTE — Progress Notes (Signed)
Called to room to assist during endoscopic procedure.  Patient ID and intended procedure confirmed with present staff. Received instructions for my participation in the procedure from the performing physician.  

## 2017-08-22 NOTE — Patient Instructions (Addendum)
I found and removed 3 tiny polyps. I will let you know pathology results and when to have another routine colonoscopy by mail and/or My Chart.  You do have hemorrhoids - I can fix these if you like.  I appreciate the opportunity to care for you. Gatha Mayer, MD, FACG    YOU HAD AN ENDOSCOPIC PROCEDURE TODAY AT [facility]:   Refer to the procedure report that was given to you for any specific questions about what was found during the examination.  If the procedure report does not answer your questions, please call your gastroenterologist to clarify.  If you requested that your care partner not be given the details of your procedure findings, then the procedure report has been included in a sealed envelope for you to review at your convenience later.  YOU SHOULD EXPECT: Some feelings of bloating in the abdomen. Passage of more gas than usual.  Walking can help get rid of the air that was put into your GI tract during the procedure and reduce the bloating. If you had a lower endoscopy (such as a colonoscopy or flexible sigmoidoscopy) you may notice spotting of blood in your stool or on the toilet paper. If you underwent a bowel prep for your procedure, you may not have a normal bowel movement for a few days.  Please Note:  You might notice some irritation and congestion in your nose or some drainage.  This is from the oxygen used during your procedure.  There is no need for concern and it should clear up in a day or so.  SYMPTOMS TO REPORT IMMEDIATELY:   Following lower endoscopy (colonoscopy or flexible sigmoidoscopy):  Excessive amounts of blood in the stool  Significant tenderness or worsening of abdominal pains  Swelling of the abdomen that is new, acute  Fever of 100F or higher   For urgent or emergent issues, a gastroenterologist can be reached at any hour by calling [phone].   DIET:  We do recommend a small meal at first, but then you may  proceed to your regular diet.  Drink plenty of fluids but you should avoid alcoholic beverages for 24 hours.  ACTIVITY:  You should plan to take it easy for the rest of today and you should NOT DRIVE or use heavy machinery until tomorrow (because of the sedation medicines used during the test).    FOLLOW UP: Our staff will call the number listed on your records the next business day following your procedure to check on you and address any questions or concerns that you may have regarding the information given to you following your procedure. If we do not reach you, we will leave a message.  However, if you are feeling well and you are not experiencing any problems, there is no need to return our call.  We will assume that you have returned to your regular daily activities without incident.  If any biopsies were taken you will be contacted by phone or by letter within the next 1-3 weeks.  Please call us at [phone] if you have not heard about the biopsies in 3 weeks.    SIGNATURES/CONFIDENTIALITY: You and/or your care partner have signed paperwork which will be entered into your electronic medical record.  These signatures attest to the fact that that the information above on your After Visit Summary has been reviewed and is understood.  Full responsibility of the confidentiality of this discharge information lies with you and/or your  care-partner.   Handouts were given to your care partner on polyps, hemorrhoids, [facility] information. Dr. Celesta Aver nurse will call you with an appointment for hemorrhoidal banding. You may resume your current medications today. Await biopsy results. Please call if any questions or concerns.

## 2017-08-22 NOTE — Progress Notes (Signed)
Spontaneous respirations throughout. VSS. Resting comfortably. To PACU on room air. Report to  RN. 

## 2017-08-22 NOTE — Progress Notes (Signed)
No problems noted in the recovery room. maw 

## 2017-08-23 ENCOUNTER — Telehealth: Payer: Self-pay

## 2017-08-23 NOTE — Telephone Encounter (Signed)
  Follow up Call-  Call back number 08/22/2017  Post procedure Call Back phone  # [phone]  Permission to leave phone message Yes  Some recent data might be hidden     No ID on answering machine.  No message left.  Will try again this afternoon. Shawnte Demarest/Call-back [facility]

## 2017-08-30 ENCOUNTER — Encounter: Payer: Self-pay | Admitting: Internal Medicine

## 2017-08-30 DIAGNOSIS — Z86010 Personal history of colonic polyps: Secondary | ICD-10-CM

## 2017-08-30 DIAGNOSIS — Z860101 Personal history of adenomatous and serrated colon polyps: Secondary | ICD-10-CM

## 2017-08-30 HISTORY — DX: Personal history of colonic polyps: Z86.010

## 2017-08-30 HISTORY — DX: Personal history of adenomatous and serrated colon polyps: Z86.0101

## 2017-08-30 NOTE — Progress Notes (Signed)
Diminutive adenomas Recall 2024

## 2017-11-16 IMAGING — CR DG ABD PORTABLE 1V
2 series · 2 of 2 positions shown · non-contrast
Comparison: No priors.

CLINICAL DATA: 59-year-old male with abdominal distention. History
of gastroesophageal reflux disease.

EXAM:
PORTABLE ABDOMEN - 1 VIEW

[AP (1 of 2)]
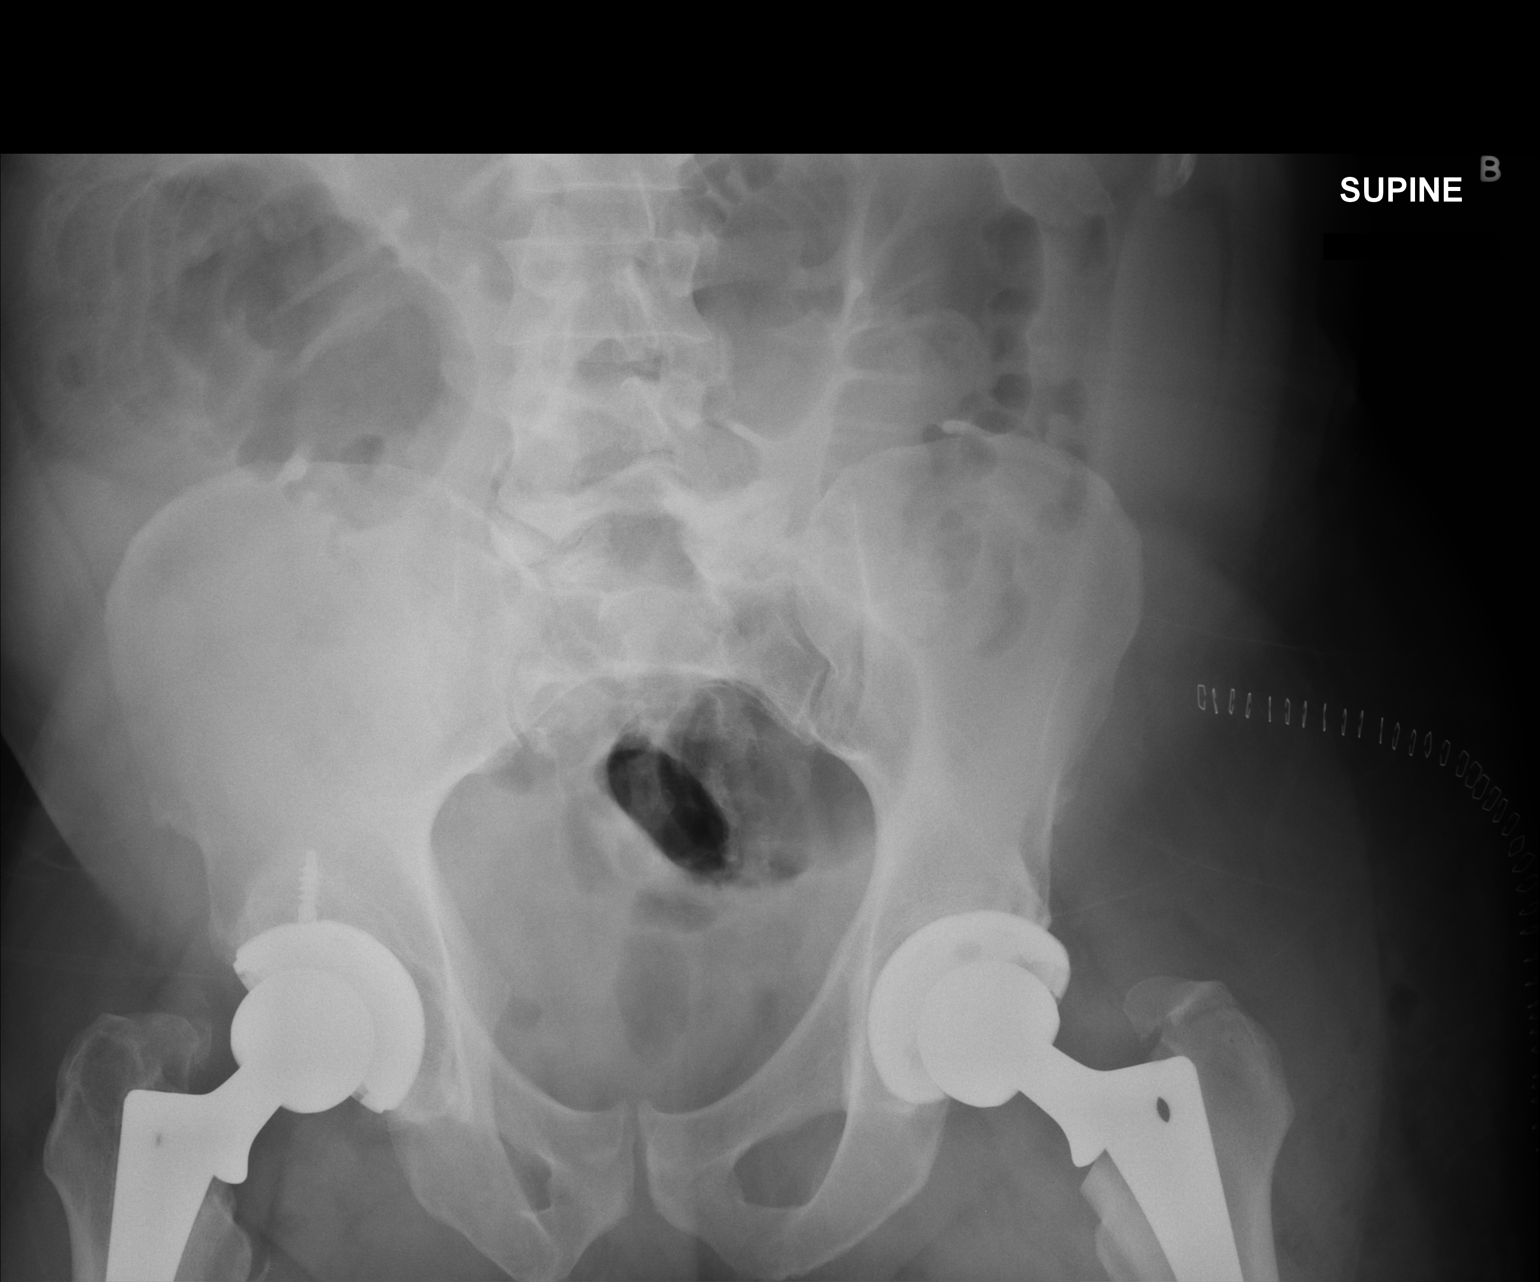

[AP (2 of 2)]
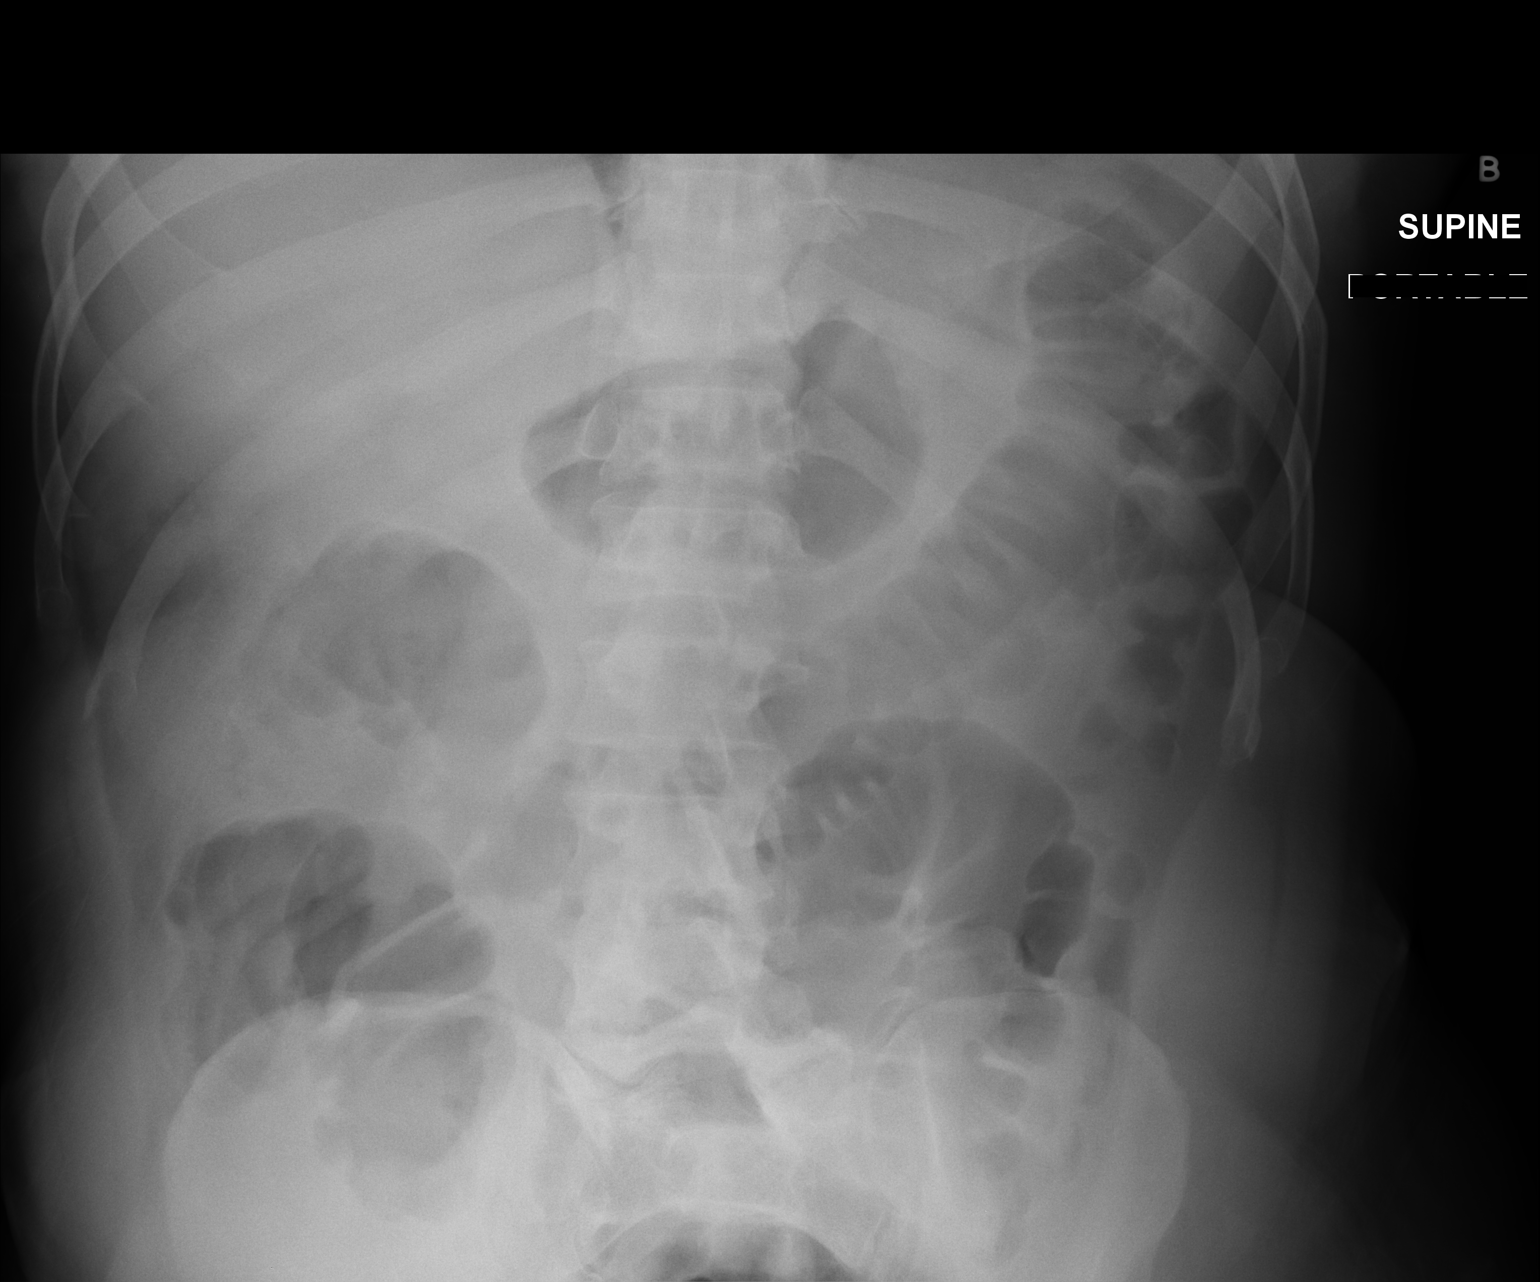

[2 of 2 positions shown; findings below may reference images not displayed]

FINDINGS: Gas and stool are seen scattered throughout the colon extending to
the level of the distal rectum. No pathologic distension of small
bowel is noted. No gross evidence of pneumoperitoneum. Surgical
clips overlying the left buttock region. Bilateral total hip
arthroplasties are noted.
IMPRESSION: 1. Nonobstructive bowel gas pattern.
2. No pneumoperitoneum.
3. Postoperative changes, as above.

## 2017-11-28 ENCOUNTER — Telehealth: Payer: Self-pay | Admitting: Internal Medicine

## 2017-11-28 NOTE — Telephone Encounter (Signed)
Wife reports husband is having rectal bleeding for the last few weeks.  He will come in and see Ellouise Newer, PA on 11/30/17 1:45

## 2017-11-28 NOTE — Telephone Encounter (Signed)
Pt is calling back and said he is having rectal bleeding.  He may be reached at [phone]

## 2017-11-28 NOTE — Telephone Encounter (Signed)
Left message for patient to call back  

## 2017-11-30 ENCOUNTER — Encounter: Payer: Self-pay | Admitting: Physician Assistant

## 2017-11-30 ENCOUNTER — Ambulatory Visit (INDEPENDENT_AMBULATORY_CARE_PROVIDER_SITE_OTHER): Payer: Medicare Other | Admitting: Physician Assistant

## 2017-11-30 VITALS — BP 128/76 | HR 76 | Ht 70.0 in | Wt 228.0 lb

## 2017-11-30 DIAGNOSIS — K625 Hemorrhage of anus and rectum: Secondary | ICD-10-CM | POA: Diagnosis not present

## 2017-11-30 DIAGNOSIS — K648 Other hemorrhoids: Secondary | ICD-10-CM | POA: Diagnosis not present

## 2017-11-30 MED ORDER — HYDROCORTISONE ACETATE 25 MG RE SUPP: 25.0000 mg | Freq: Two times a day (BID) | RECTAL | 1 refills | Status: DC

## 2017-11-30 NOTE — Patient Instructions (Signed)
If you are age 62 or older, your body mass index should be between 23-30. Your Body mass index is 32.71 kg/m. If this is out of the aforementioned range listed, please consider follow up with your Primary Care Provider.  If you are age 31 or younger, your body mass index should be between 19-25. Your Body mass index is 32.71 kg/m. If this is out of the aformentioned range listed, please consider follow up with your Primary Care Provider.   We have sent the following medications to your pharmacy for you to pick up at your convenience: Anusol Suppositories - if insurance will not cover, Use Preparation H Suppositories with hydrocortisone cream.  Follow up as needed.  Thank you for choosing me and [facility].  Ellouise Newer, PA-C

## 2017-11-30 NOTE — Progress Notes (Addendum)
Chief Complaint: Rectal bleeding  HPI:    Brandon Ferrell is a 62 year old male with a past medical history as listed below including COPD and reflux, who follows with Dr. Carlean Purl and presents clinic today with a complaint of rectal bleeding.    08/22/2017 colonoscopy Dr. Carlean Purl with 3 sessile polyps in the transverse and ascending colon diminutive in size, removed with cold snare.  Also internal hemorrhoids.  Pathology showed adenomatous polyps.  Repeat was recommended in 5 years.  It was discussed the patient could have hemorrhoid banding if desired.    Today, explains that ever since time of his colonoscopy has had some trouble with seeing bright red blood anytime he has a bowel movement in the toilet.  Tells me that he having was having occasional bright red blood when he wiped before but now it is anytime he has a bowel movement.  Explains that he did bleed some after the colonoscopy and this had gotten better and was back to his "normal" until last week when he saw another episode of increased bright red blood filling the toilet and this worried him.    Denies rectal pain, fever, chills, weight loss or change in bowel habits.     Past Medical History:  Diagnosis Date  . Acute renal insufficiency 07/18/2015  . Acute urinary retention 07/18/2015  . Arthritis   . COPD (chronic obstructive pulmonary disease) ([place])   . GERD (gastroesophageal reflux disease)    occ zantac  . Hx of adenomatous colonic polyps 08/30/2017  . Hyperlipidemia   . Hypertension   . Palpitations   . Pneumonia    hx    Past Surgical History:  Procedure Laterality Date  . GROIN EXPLORATION Right 07/03/15   cyst removed- local anesthesia [place]  . LUNG BIOPSY  13   pneum, dx copd  . TOTAL HIP ARTHROPLASTY Right 11/07/2014   Procedure: RIGHT  TOTAL HIP ARTHROPLASTY;  Surgeon: Earlie Server, MD;  Location: [facility];  Service: Orthopedics;  Laterality: Right;  Would like to flip if possible    . TOTAL HIP ARTHROPLASTY  Left 07/17/2015   Procedure: LEFT TOTAL HIP ARTHROPLASTY;  Surgeon: Earlie Server, MD;  Location: [facility];  Service: Orthopedics;  Laterality: Left;  Marland Kitchen VASECTOMY  5/15    Current Outpatient Medications  Medication Sig Dispense Refill  . albuterol (PROVENTIL HFA;VENTOLIN HFA) 108 (90 BASE) MCG/ACT inhaler Inhale 2 puffs into the lungs every 6 (six) hours as needed for wheezing or shortness of breath.    Marland Kitchen amLODipine (NORVASC) 5 MG tablet Take 5 mg by mouth daily.    . budesonide-formoterol (SYMBICORT) 160-4.5 MCG/ACT inhaler Inhale 2 puffs into the lungs 2 (two) times daily.    . diphenhydrAMINE (BENADRYL) 25 MG tablet Take 25 mg by mouth at bedtime.    . docusate sodium (COLACE) 100 MG capsule Take 1 capsule (100 mg total) by mouth 2 (two) times daily. (Patient not taking: Reported on 03/11/2016) 30 capsule 0  . ezetimibe (ZETIA) 10 MG tablet Take 10 mg by mouth daily.  3  . gabapentin (NEURONTIN) 300 MG capsule Take 300 mg by mouth 3 (three) times daily.  5  . methocarbamol (ROBAXIN-750) 750 MG tablet Take 1 tablet (750 mg total) by mouth every 6 (six) hours as needed for muscle spasms. (Patient not taking: Reported on 03/11/2016) 60 tablet 0  . naloxegol oxalate (MOVANTIK) 25 MG TABS tablet Take 1 tablet (25 mg total) by mouth daily. (Patient not taking: Reported on 03/11/2016) 30  tablet 0  . omega-3 acid ethyl esters (LOVAZA) 1 g capsule Take 2 capsules by mouth 2 (two) times daily.  6  . Oxycodone HCl 10 MG TABS Take 10 mg by mouth 3 (three) times daily.  0  . oxyCODONE-acetaminophen (PERCOCET) 10-325 MG tablet Take 1 tablet by mouth every 4 (four) hours as needed for pain. 90 tablet 0  . pravastatin (PRAVACHOL) 80 MG tablet Take 80 mg by mouth daily.    . ranitidine (ZANTAC) 150 MG tablet Take 150 mg by mouth 2 (two) times daily.    . rosuvastatin (CRESTOR) 20 MG tablet     . tamsulosin (FLOMAX) 0.4 MG CAPS capsule Take 1 capsule (0.4 mg total) by mouth daily. (Patient not taking: Reported on  03/11/2016) 14 capsule 0  . traZODone (DESYREL) 50 MG tablet Take 50 mg by mouth at bedtime.  0   No current facility-administered medications for this visit.     Allergies as of 11/30/2017 - Review Complete 08/22/2017  Allergen Reaction Noted  . Bee venom Anaphylaxis 06/21/2011  . Shellfish allergy Anaphylaxis and Hives 06/21/2011    Family History  Problem Relation Age of Onset  . Colon cancer Maternal Grandfather   . Stomach cancer Father   . Colon polyps Neg Hx   . Esophageal cancer Neg Hx   . Rectal cancer Neg Hx     Social History   Socioeconomic History  . Marital status: Married    Spouse name: Not on file  . Number of children: Not on file  . Years of education: Not on file  . Highest education level: Not on file  Occupational History  . Not on file  Social Needs  . Financial resource strain: Not on file  . Food insecurity:    Worry: Not on file    Inability: Not on file  . Transportation needs:    Medical: Not on file    Non-medical: Not on file  Tobacco Use  . Smoking status: Current Some Day Smoker    Packs/day: 1.00    Years: 40.00    Pack years: 40.00    Types: Cigars  . Smokeless tobacco: Never Used  Substance and Sexual Activity  . Alcohol use: No  . Drug use: No  . Sexual activity: Not on file  Lifestyle  . Physical activity:    Days per week: Not on file    Minutes per session: Not on file  . Stress: Not on file  Relationships  . Social connections:    Talks on phone: Not on file    Gets together: Not on file    Attends religious service: Not on file    Active member of club or organization: Not on file    Attends meetings of clubs or organizations: Not on file    Relationship status: Not on file  . Intimate partner violence:    Fear of current or ex partner: Not on file    Emotionally abused: Not on file    Physically abused: Not on file    Forced sexual activity: Not on file  Other Topics Concern  . Not on file  Social History  Narrative  . Not on file    Review of Systems:    Constitutional: No weight loss, fever or chills Cardiovascular: No chest pain Respiratory: No SOB  Gastrointestinal: See HPI and otherwise negative   Physical Exam:  Vital signs: BP 128/76 (BP Location: Left Arm, Patient Position: Sitting, Cuff Size: Normal)  Pulse 76   Ht 5\' 10"  (1.778 m) Comment: height measured without shoes  Wt 228 lb (103.4 kg)   BMI 32.71 kg/m    Constitutional:   Pleasant AA male appears to be in NAD, Well developed, Well nourished, alert and cooperative Respiratory: Respirations even and unlabored. Lungs clear to auscultation bilaterally.   No wheezes, crackles, or rhonchi.  Cardiovascular: Normal S1, S2. No MRG. Regular rate and rhythm. No peripheral edema, cyanosis or pallor.  Gastrointestinal:  Soft, nondistended, nontender. No rebound or guarding. Normal bowel sounds. No appreciable masses or hepatomegaly. Rectal: External: external hemorrhoid tag; internal: no mass, no discharge; Anoscopy: patient had increased sphincter tone and exam was limited, did visualize partial Grade I internal hemorrhoid Psychiatric: Demonstrates good judgement and reason without abnormal affect or behaviors.  RELEVANT LABS AND IMAGING: CBC    Component Value Date/Time   WBC 9.7 07/20/2015 0400   RBC 3.20 (L) 07/20/2015 0400   HGB 9.5 (L) 07/20/2015 0400   HCT 28.0 (L) 07/20/2015 0400   PLT 148 (L) 07/20/2015 0400   MCV 87.5 07/20/2015 0400   MCH 29.7 07/20/2015 0400   MCHC 33.9 07/20/2015 0400   RDW 13.9 07/20/2015 0400   LYMPHSABS 3.8 07/06/2015 1015   MONOABS 0.8 07/06/2015 1015   EOSABS 0.9 (H) 07/06/2015 1015   BASOSABS 0.0 07/06/2015 1015    CMP     Component Value Date/Time   NA 130 (L) 07/19/2015 1005   K 3.9 07/19/2015 1005   CL 99 (L) 07/19/2015 1005   CO2 23 07/19/2015 1005   GLUCOSE 99 07/19/2015 1005   BUN 17 07/19/2015 1005   CREATININE 1.35 (H) 07/19/2015 1005   CALCIUM 8.2 (L) 07/19/2015  1005   PROT 6.0 (L) 07/19/2015 1005   ALBUMIN 2.9 (L) 07/19/2015 1005   AST 39 07/19/2015 1005   ALT 21 07/19/2015 1005   ALKPHOS 59 07/19/2015 1005   BILITOT 0.7 07/19/2015 1005   GFRNONAA 56 (L) 07/19/2015 1005   GFRAA >60 07/19/2015 1005    Assessment: 1.  Internal hemorrhoids: Seen at time of recent colonoscopy, banding was thought an option if patient needed 2.  Rectal bleeding: Daily with bowel movements, worse since time of colonoscopy; likely from above  Plan: 1.  Discussed that hemorrhoids are likely the cause of patient's bleeding. 2.  Prescribed Anusol suppositories twice daily x7 days with 1 refill.  Discussed that if he could not afford this with his insurance, then would recommend Preparation H suppositories with hydrocortisone ointment. 3.  Discussed with patient that if he continues with bleeding after the next 2 weeks and use of suppositories as above then he needs to let us know.  At that time may consider repeat colonoscopy versus hemorrhoid banding with Dr. Carlean Purl. 4.  Patient will follow in clinic as needed in the future.  Ellouise Newer, PA-C [facility] 11/30/2017, 1:32 PM  Cc: Lucia Gaskins, MD   Agree with Ms. Lemmon's evaluation and management.  Gatha Mayer, MD, [facility]

## 2018-11-19 ENCOUNTER — Other Ambulatory Visit: Payer: Self-pay | Admitting: Orthopedic Surgery

## 2018-11-26 ENCOUNTER — Encounter (HOSPITAL_COMMUNITY): Admission: RE | Admit: 2018-11-26 | Payer: Medicare Other | Source: Ambulatory Visit

## 2018-11-30 ENCOUNTER — Encounter (HOSPITAL_COMMUNITY)
Admission: RE | Admit: 2018-11-30 | Discharge: 2018-11-30 | Disposition: A | Discharge status: Home / Self Care | Payer: Medicare Other | Source: Ambulatory Visit | Attending: Orthopedic Surgery | Admitting: Orthopedic Surgery

## 2018-11-30 ENCOUNTER — Other Ambulatory Visit (HOSPITAL_COMMUNITY)
Admission: RE | Admit: 2018-11-30 | Discharge: 2018-11-30 | Disposition: A | Discharge status: Home / Self Care | Payer: Medicare Other | Source: Ambulatory Visit | Attending: Orthopedic Surgery | Admitting: Orthopedic Surgery

## 2018-11-30 ENCOUNTER — Encounter (HOSPITAL_COMMUNITY): Payer: Self-pay

## 2018-11-30 ENCOUNTER — Encounter (INDEPENDENT_AMBULATORY_CARE_PROVIDER_SITE_OTHER): Payer: Self-pay

## 2018-11-30 ENCOUNTER — Other Ambulatory Visit: Payer: Self-pay

## 2018-11-30 DIAGNOSIS — Z01812 Encounter for preprocedural laboratory examination: Secondary | ICD-10-CM | POA: Insufficient documentation

## 2018-11-30 DIAGNOSIS — Z20828 Contact with and (suspected) exposure to other viral communicable diseases: Secondary | ICD-10-CM | POA: Insufficient documentation

## 2018-11-30 HISTORY — DX: Unspecified asthma, uncomplicated: J45.909

## 2018-11-30 LAB — CBC
HCT: 45.3 % (ref 39.0–52.0)
Hemoglobin: 15 g/dL (ref 13.0–17.0)
MCH: 30.2 pg (ref 26.0–34.0)
MCHC: 33.1 g/dL (ref 30.0–36.0)
MCV: 91.3 fL (ref 80.0–100.0)
Platelets: 165 10*3/uL (ref 150–400)
RBC: 4.96 MIL/uL (ref 4.22–5.81)
RDW: 14.5 % (ref 11.5–15.5)
WBC: 10.4 10*3/uL (ref 4.0–10.5)
nRBC: 0 % (ref 0.0–0.2)

## 2018-11-30 LAB — BASIC METABOLIC PANEL
Anion gap: 6 (ref 5–15)
BUN: 13 mg/dL (ref 8–23)
CO2: 25 mmol/L (ref 22–32)
Calcium: 9.3 mg/dL (ref 8.9–10.3)
Chloride: 110 mmol/L (ref 98–111)
Creatinine, Ser: 1.26 mg/dL — ABNORMAL HIGH (ref 0.61–1.24)
GFR calc Af Amer: >60 mL/min (ref >60)
GFR calc non Af Amer: >60 mL/min (ref >60)
Glucose, Bld: 98 mg/dL (ref 70–99)
Potassium: 4.4 mmol/L (ref 3.5–5.1)
Sodium: 141 mmol/L (ref 135–145)

## 2018-11-30 LAB — SURGICAL PCR SCREEN
MRSA, PCR: NEGATIVE
Staphylococcus aureus: NEGATIVE

## 2018-11-30 LAB — SARS CORONAVIRUS 2 (TAT 6-24 HRS): SARS Coronavirus 2: NEGATIVE

## 2018-11-30 NOTE — Patient Instructions (Addendum)
DUE TO COVID-19 ONLY ONE VISITOR IS ALLOWED TO COME WITH YOU AND STAY IN THE WAITING ROOM ONLY DURING PRE OP AND PROCEDURE DAY OF SURGERY. THE 1 VISITOR MAY VISIT WITH YOU AFTER SURGERY IN YOUR PRIVATE ROOM DURING VISITING HOURS ONLY!  YOU NEED TO HAVE A COVID 19 TEST ON_Fritay 8/28______ @_11 :00______, THIS TEST MUST BE DONE BEFORE SURGERY, COME  [address], [place] , [id number].  ([place])  ONCE YOUR COVID TEST IS COMPLETED, PLEASE BEGIN THE QUARANTINE INSTRUCTIONS AS OUTLINED IN YOUR HANDOUT.                [place] Ferrell   Your procedure is scheduled on: Tuesday 12/04/18   Report to [place]  Entrance  Report to admitting at   8:00 AM     Call this number if you have problems the morning of surgery [place], NO [place].   Do not eat food After Midnight.  YOU MAY HAVE CLEAR LIQUIDS FROM MIDNIGHT UNTIL 4:30  AM.  At 4:30AM Please finish the prescribed Pre-Surgery drink.  Nothing by mouth after you finish the  drink !   Take these medicines the morning of surgery with A SIP OF WATER: Gabapentin, Amlodipine  , use your inhaler if needed and bring to hospital with you.  Bring your mask and tubing to the hospital.             You may not have any metal on your body including piercings             Do not wear jewelry,  lotions, powders or  deodorant     Do not bring valuables to the hospital. [place].  Contacts, dentures or bridgework may not be worn into surgery.    Name and phone number of your driver:  Special Instructions: N/A              Please read over the following fact sheets you were given: _____________________________________________________________________             [facility] - Preparing for Surgery  Before surgery, you can play an important role.   Because skin is not  sterile, your skin needs to be as free of germs as possible.   You can reduce the number of germs on your skin by washing with CHG (chlorahexidine gluconate) soap before surgery.   CHG is an antiseptic cleaner which kills germs and bonds with the skin to continue killing germs even after washing. Please DO NOT use if you have an allergy to CHG or antibacterial soaps.   If your skin becomes reddened/irritated stop using the CHG and inform your nurse when you arrive at Short Stay.   You may shave your face/neck.  Please follow these instructions carefully:  1.  Shower with CHG Soap the night before surgery and the  morning of Surgery.  2.  If you choose to wash your hair, wash your hair first as usual with your  normal  shampoo.  3.  After you shampoo, rinse your hair and body thoroughly to remove the  shampoo.  4.  Use CHG as you would any other liquid soap.  You can apply chg directly  to the skin and wash                       Gently with a scrungie or clean washcloth.  5.  Apply the CHG Soap to your body ONLY FROM THE NECK DOWN.   Do not use on face/ open                           Wound or open sores. Avoid contact with eyes, ears mouth and genitals (private parts).                       Wash face,  Genitals (private parts) with your normal soap.             6.  Wash thoroughly, paying special attention to the area where your surgery  will be performed.  7.  Thoroughly rinse your body with warm water from the neck down.  8.  DO NOT shower/wash with your normal soap after using and rinsing off  the CHG Soap.             9.  Pat yourself dry with a clean towel.            10.  Wear clean pajamas.            11.  Place clean sheets on your bed the night of your first shower and do not  sleep with pets. Day of Surgery : Do not apply any lotions/deodorants the morning of surgery.  Please wear clean clothes to the hospital/surgery center.   FAILURE TO  FOLLOW THESE INSTRUCTIONS MAY RESULT IN THE CANCELLATION OF YOUR SURGERY PATIENT SIGNATURE_________________________________  NURSE SIGNATURE__________________________________  ________________________________________________________________________   [name]  An incentive spirometer is a tool that can help keep your lungs clear and active. This tool measures how well you are filling your lungs with each breath. Taking long deep breaths may help reverse or decrease the chance of developing breathing (pulmonary) problems (especially infection) following:  A long period of time when you are unable to move or be active. BEFORE THE PROCEDURE   If the spirometer includes an indicator to show your best effort, your nurse or respiratory therapist will set it to a desired goal.  If possible, sit up straight or lean slightly forward. Try not to slouch.  Hold the incentive spirometer in an upright position. INSTRUCTIONS FOR USE  1. Sit on the edge of your bed if possible, or sit up as far as you can in bed or on a chair. 2. Hold the incentive spirometer in an upright position. 3. Breathe out normally. 4. Place the mouthpiece in your mouth and seal your lips tightly around it. 5. Breathe in slowly and as deeply as possible, raising the piston or the ball toward the top of the column. 6. Hold your breath for 3-5 seconds or for as long as possible. Allow the piston or ball to fall to the bottom of the column. 7. Remove the mouthpiece from your mouth and breathe out normally. 8. Rest for a few seconds and repeat Steps 1 through 7 at least 10 times every 1-2 hours when you are awake. Take your time and take a few normal breaths between deep breaths. 9. The spirometer may include an indicator to show your best  effort. Use the indicator as a goal to work toward during each repetition. 10. After each set of 10 deep breaths, practice coughing to be sure your lungs are clear. If you have an  incision (the cut made at the time of surgery), support your incision when coughing by placing a pillow or rolled up towels firmly against it. Once you are able to get out of bed, walk around indoors and cough well. You may stop using the incentive spirometer when instructed by your caregiver.  RISKS AND COMPLICATIONS  Take your time so you do not get dizzy or light-headed.  If you are in pain, you may need to take or ask for pain medication before doing incentive spirometry. It is harder to take a deep breath if you are having pain. AFTER USE  Rest and breathe slowly and easily.  It can be helpful to keep track of a log of your progress. Your caregiver can provide you with a simple table to help with this. If you are using the spirometer at home, follow these instructions: [place] IF:   You are having difficultly using the spirometer.  You have trouble using the spirometer as often as instructed.  Your pain medication is not giving enough relief while using the spirometer.  You develop fever of 100.5 F (38.1 C) or higher. SEEK IMMEDIATE MEDICAL CARE IF:   You cough up bloody sputum that had not been present before.  You develop fever of 102 F (38.9 C) or greater.  You develop worsening pain at or near the incision site. MAKE SURE YOU:   Understand these instructions.  Will watch your condition.  Will get help right away if you are not doing well or get worse. Document Released: 08/01/2006 Document Revised: 06/13/2011 Document Reviewed: 10/02/2006 [facility] Patient Information 2014 [place], [place].   ________________________________________________________________________

## 2018-11-30 NOTE — Progress Notes (Signed)
PCP -  Dr Alfonso Patten. [place] Cardiologist - none  Chest x-ray -  EKG - 11/19/18 Stress Test - no ECHO - no Cardiac Cath - no  Sleep Study yes. Finding are negative but wife says he stops breathing at night  CPAP - no  Fasting Blood Sugar - NA Checks Blood Sugar _____ times a day  Blood Thinner Instructions: Aspirin Instructions:NA Last Dose:  Anesthesia review:   Patient denies shortness of breath, fever, cough and chest pain at PAT appointment  Yes but has COPD   Patient verbalized understanding of instructions that were given to them at the PAT appointment. Patient was also instructed that they will need to review over the PAT instructions again at home before surgery  .yes

## 2018-12-04 ENCOUNTER — Inpatient Hospital Stay (HOSPITAL_COMMUNITY)
Admission: RE | Admit: 2018-12-04 | Discharge: 2018-12-05 | DRG: 483 | Disposition: A | Discharge status: Home / Self Care | Payer: Medicare Other | Attending: Orthopedic Surgery | Admitting: Orthopedic Surgery

## 2018-12-04 ENCOUNTER — Inpatient Hospital Stay (HOSPITAL_COMMUNITY): Payer: Medicare Other

## 2018-12-04 ENCOUNTER — Inpatient Hospital Stay (HOSPITAL_COMMUNITY): Payer: Medicare Other | Admitting: Physician Assistant

## 2018-12-04 ENCOUNTER — Encounter (HOSPITAL_COMMUNITY)
Admission: RE | Disposition: A | Discharge status: Home / Self Care | Payer: Self-pay | Source: Home / Self Care | Attending: Orthopedic Surgery

## 2018-12-04 ENCOUNTER — Encounter (HOSPITAL_COMMUNITY): Payer: Self-pay | Admitting: *Deleted

## 2018-12-04 ENCOUNTER — Inpatient Hospital Stay (HOSPITAL_COMMUNITY): Payer: Medicare Other | Admitting: Anesthesiology

## 2018-12-04 DIAGNOSIS — Z96643 Presence of artificial hip joint, bilateral: Secondary | ICD-10-CM | POA: Diagnosis present

## 2018-12-04 DIAGNOSIS — M25512 Pain in left shoulder: Secondary | ICD-10-CM | POA: Diagnosis present

## 2018-12-04 DIAGNOSIS — Z91013 Allergy to seafood: Secondary | ICD-10-CM

## 2018-12-04 DIAGNOSIS — Z79899 Other long term (current) drug therapy: Secondary | ICD-10-CM

## 2018-12-04 DIAGNOSIS — I10 Essential (primary) hypertension: Secondary | ICD-10-CM | POA: Diagnosis present

## 2018-12-04 DIAGNOSIS — G8929 Other chronic pain: Secondary | ICD-10-CM | POA: Diagnosis present

## 2018-12-04 DIAGNOSIS — F17290 Nicotine dependence, other tobacco product, uncomplicated: Secondary | ICD-10-CM | POA: Diagnosis present

## 2018-12-04 DIAGNOSIS — M19012 Primary osteoarthritis, left shoulder: Secondary | ICD-10-CM | POA: Diagnosis present

## 2018-12-04 DIAGNOSIS — J449 Chronic obstructive pulmonary disease, unspecified: Secondary | ICD-10-CM | POA: Diagnosis present

## 2018-12-04 DIAGNOSIS — Z96619 Presence of unspecified artificial shoulder joint: Secondary | ICD-10-CM

## 2018-12-04 DIAGNOSIS — Z800 Family history of malignant neoplasm of digestive organs: Secondary | ICD-10-CM | POA: Diagnosis not present

## 2018-12-04 DIAGNOSIS — K219 Gastro-esophageal reflux disease without esophagitis: Secondary | ICD-10-CM | POA: Diagnosis present

## 2018-12-04 DIAGNOSIS — E785 Hyperlipidemia, unspecified: Secondary | ICD-10-CM | POA: Diagnosis present

## 2018-12-04 DIAGNOSIS — Z7951 Long term (current) use of inhaled steroids: Secondary | ICD-10-CM | POA: Diagnosis not present

## 2018-12-04 DIAGNOSIS — Z86010 Personal history of colonic polyps: Secondary | ICD-10-CM

## 2018-12-04 DIAGNOSIS — F1120 Opioid dependence, uncomplicated: Secondary | ICD-10-CM | POA: Diagnosis present

## 2018-12-04 DIAGNOSIS — Z91030 Bee allergy status: Secondary | ICD-10-CM

## 2018-12-04 HISTORY — DX: Primary osteoarthritis, left shoulder: M19.012

## 2018-12-04 HISTORY — PX: TOTAL SHOULDER ARTHROPLASTY: SHX126

## 2018-12-04 SURGERY — ARTHROPLASTY, SHOULDER, TOTAL
Anesthesia: Regional | Site: Shoulder | Laterality: Left

## 2018-12-04 MED ORDER — STERILE WATER FOR IRRIGATION IR SOLN
Status: DC | PRN
  Administered 2018-12-04: 2000 mL

## 2018-12-04 MED ORDER — ONDANSETRON HCL 4 MG PO TABS: 4.0000 mg | ORAL_TABLET | Freq: Four times a day (QID) | ORAL | Status: DC | PRN

## 2018-12-04 MED ORDER — LIDOCAINE 2% (20 MG/ML) 5 ML SYRINGE
INTRAMUSCULAR | Status: DC | PRN
  Administered 2018-12-04: 100 mg via INTRAVENOUS

## 2018-12-04 MED ORDER — METHOCARBAMOL 500 MG IVPB - SIMPLE MED
500.0000 mg | Freq: Four times a day (QID) | INTRAVENOUS | Status: DC | PRN
  Administered 2018-12-04: 500 mg via INTRAVENOUS
  Filled 2018-12-04: qty 50

## 2018-12-04 MED ORDER — ALBUTEROL SULFATE HFA 108 (90 BASE) MCG/ACT IN AERS
INHALATION_SPRAY | RESPIRATORY_TRACT | Status: AC
  Filled 2018-12-04: qty 6.7

## 2018-12-04 MED ORDER — BUPIVACAINE HCL (PF) 0.5 % IJ SOLN
INTRAMUSCULAR | Status: DC | PRN
  Administered 2018-12-04: 15 mL via PERINEURAL

## 2018-12-04 MED ORDER — FENTANYL CITRATE (PF) 250 MCG/5ML IJ SOLN
INTRAMUSCULAR | Status: AC
  Filled 2018-12-04: qty 5

## 2018-12-04 MED ORDER — BACLOFEN 10 MG PO TABS: 10.0000 mg | ORAL_TABLET | Freq: Three times a day (TID) | ORAL | 0 refills | Status: DC

## 2018-12-04 MED ORDER — SODIUM CHLORIDE 0.9 % IR SOLN
Status: DC | PRN
  Administered 2018-12-04: 1000 mL

## 2018-12-04 MED ORDER — SENNA-DOCUSATE SODIUM 8.6-50 MG PO TABS: 2.0000 | ORAL_TABLET | Freq: Every day | ORAL | 1 refills | Status: DC

## 2018-12-04 MED ORDER — ROCURONIUM BROMIDE 10 MG/ML (PF) SYRINGE
PREFILLED_SYRINGE | INTRAVENOUS | Status: AC
  Filled 2018-12-04: qty 10

## 2018-12-04 MED ORDER — ALBUTEROL SULFATE (2.5 MG/3ML) 0.083% IN NEBU: 3.0000 mL | INHALATION_SOLUTION | Freq: Four times a day (QID) | RESPIRATORY_TRACT | Status: DC | PRN

## 2018-12-04 MED ORDER — GABAPENTIN 300 MG PO CAPS
300.0000 mg | ORAL_CAPSULE | Freq: Every day | ORAL | Status: DC
  Administered 2018-12-04: 22:00:00 300 mg via ORAL
  Filled 2018-12-04: qty 1

## 2018-12-04 MED ORDER — EPHEDRINE SULFATE-NACL 50-0.9 MG/10ML-% IV SOSY
PREFILLED_SYRINGE | INTRAVENOUS | Status: DC | PRN
  Administered 2018-12-04: 15 mg via INTRAVENOUS
  Administered 2018-12-04 (×3): 5 mg via INTRAVENOUS
  Administered 2018-12-04: 10 mg via INTRAVENOUS
  Administered 2018-12-04 (×2): 5 mg via INTRAVENOUS
  Administered 2018-12-04: 10 mg via INTRAVENOUS

## 2018-12-04 MED ORDER — EPHEDRINE 5 MG/ML INJ
INTRAVENOUS | Status: AC
  Filled 2018-12-04: qty 10

## 2018-12-04 MED ORDER — ZOLPIDEM TARTRATE 5 MG PO TABS: 5.0000 mg | ORAL_TABLET | Freq: Every evening | ORAL | Status: DC | PRN

## 2018-12-04 MED ORDER — ACETAMINOPHEN 325 MG PO TABS: 325.0000 mg | ORAL_TABLET | Freq: Four times a day (QID) | ORAL | Status: DC | PRN

## 2018-12-04 MED ORDER — MOMETASONE FURO-FORMOTEROL FUM 200-5 MCG/ACT IN AERO
2.0000 | INHALATION_SPRAY | Freq: Two times a day (BID) | RESPIRATORY_TRACT | Status: DC
  Administered 2018-12-04 – 2018-12-05 (×2): 2 via RESPIRATORY_TRACT
  Filled 2018-12-04: qty 8.8

## 2018-12-04 MED ORDER — ALUM & MAG HYDROXIDE-SIMETH 200-200-20 MG/5ML PO SUSP
30.0000 mL | ORAL | Status: DC | PRN
  Administered 2018-12-04: 22:00:00 30 mL via ORAL
  Filled 2018-12-04: qty 30

## 2018-12-04 MED ORDER — FENTANYL CITRATE (PF) 100 MCG/2ML IJ SOLN
INTRAMUSCULAR | Status: AC
  Filled 2018-12-04: qty 2

## 2018-12-04 MED ORDER — 0.9 % SODIUM CHLORIDE (POUR BTL) OPTIME
TOPICAL | Status: DC | PRN
  Administered 2018-12-04: 11:00:00 1000 mL

## 2018-12-04 MED ORDER — DEXAMETHASONE SODIUM PHOSPHATE 10 MG/ML IJ SOLN
INTRAMUSCULAR | Status: AC
  Filled 2018-12-04: qty 1

## 2018-12-04 MED ORDER — KETOROLAC TROMETHAMINE 15 MG/ML IJ SOLN
7.5000 mg | Freq: Four times a day (QID) | INTRAMUSCULAR | Status: DC
  Administered 2018-12-04 – 2018-12-05 (×3): 7.5 mg via INTRAVENOUS
  Filled 2018-12-04 (×3): qty 1

## 2018-12-04 MED ORDER — CHLORHEXIDINE GLUCONATE 4 % EX LIQD: 60.0000 mL | Freq: Once | CUTANEOUS | Status: DC

## 2018-12-04 MED ORDER — ONDANSETRON HCL 4 MG/2ML IJ SOLN
INTRAMUSCULAR | Status: DC | PRN
  Administered 2018-12-04: 4 mg via INTRAVENOUS

## 2018-12-04 MED ORDER — OXYCODONE HCL 5 MG PO TABS
5.0000 mg | ORAL_TABLET | ORAL | Status: DC | PRN
  Administered 2018-12-05: 09:00:00 10 mg via ORAL
  Filled 2018-12-04: qty 2

## 2018-12-04 MED ORDER — HYDROMORPHONE HCL 1 MG/ML IJ SOLN: 0.5000 mg | INTRAMUSCULAR | Status: DC | PRN

## 2018-12-04 MED ORDER — MAGNESIUM CITRATE PO SOLN: 1.0000 | Freq: Once | ORAL | Status: DC | PRN

## 2018-12-04 MED ORDER — ACETAMINOPHEN 500 MG PO TABS
1000.0000 mg | ORAL_TABLET | Freq: Four times a day (QID) | ORAL | Status: DC
  Administered 2018-12-04 – 2018-12-05 (×3): 1000 mg via ORAL
  Filled 2018-12-04 (×3): qty 2

## 2018-12-04 MED ORDER — PROPOFOL 10 MG/ML IV BOLUS
INTRAVENOUS | Status: DC | PRN
  Administered 2018-12-04: 150 mg via INTRAVENOUS
  Administered 2018-12-04: 50 mg via INTRAVENOUS

## 2018-12-04 MED ORDER — SUGAMMADEX SODIUM 200 MG/2ML IV SOLN
INTRAVENOUS | Status: DC | PRN
  Administered 2018-12-04: 200 mg via INTRAVENOUS

## 2018-12-04 MED ORDER — LACTATED RINGERS IV SOLN
INTRAVENOUS | Status: DC
  Administered 2018-12-04 (×2): via INTRAVENOUS

## 2018-12-04 MED ORDER — HYDROMORPHONE HCL 1 MG/ML IJ SOLN: 0.2500 mg | INTRAMUSCULAR | Status: DC | PRN

## 2018-12-04 MED ORDER — AMLODIPINE BESYLATE 5 MG PO TABS: 5.0000 mg | ORAL_TABLET | Freq: Every day | ORAL | Status: DC

## 2018-12-04 MED ORDER — MIDAZOLAM HCL 2 MG/2ML IJ SOLN
1.0000 mg | Freq: Once | INTRAMUSCULAR | Status: AC
  Administered 2018-12-04: 2 mg via INTRAVENOUS
  Filled 2018-12-04: qty 2

## 2018-12-04 MED ORDER — BUPIVACAINE HCL (PF) 0.25 % IJ SOLN
INTRAMUSCULAR | Status: AC
  Filled 2018-12-04: qty 30

## 2018-12-04 MED ORDER — FENTANYL CITRATE (PF) 100 MCG/2ML IJ SOLN
25.0000 ug | INTRAMUSCULAR | Status: DC | PRN
  Administered 2018-12-04 (×3): 50 ug via INTRAVENOUS

## 2018-12-04 MED ORDER — METOCLOPRAMIDE HCL 5 MG PO TABS: 5.0000 mg | ORAL_TABLET | Freq: Three times a day (TID) | ORAL | Status: DC | PRN

## 2018-12-04 MED ORDER — FENTANYL CITRATE (PF) 100 MCG/2ML IJ SOLN
50.0000 ug | Freq: Once | INTRAMUSCULAR | Status: AC
  Administered 2018-12-04: 09:00:00 100 ug via INTRAVENOUS
  Filled 2018-12-04: qty 2

## 2018-12-04 MED ORDER — KETOROLAC TROMETHAMINE 30 MG/ML IJ SOLN
INTRAMUSCULAR | Status: AC
  Filled 2018-12-04: qty 1

## 2018-12-04 MED ORDER — DEXAMETHASONE SODIUM PHOSPHATE 10 MG/ML IJ SOLN
INTRAMUSCULAR | Status: DC | PRN
  Administered 2018-12-04: 10 mg via INTRAVENOUS

## 2018-12-04 MED ORDER — OXYCODONE HCL 5 MG PO TABS
10.0000 mg | ORAL_TABLET | ORAL | Status: DC | PRN
  Administered 2018-12-04 (×2): 10 mg via ORAL
  Filled 2018-12-04 (×2): qty 2

## 2018-12-04 MED ORDER — ONDANSETRON HCL 4 MG/2ML IJ SOLN: 4.0000 mg | Freq: Four times a day (QID) | INTRAMUSCULAR | Status: DC | PRN

## 2018-12-04 MED ORDER — LIDOCAINE 2% (20 MG/ML) 5 ML SYRINGE
INTRAMUSCULAR | Status: AC
  Filled 2018-12-04: qty 5

## 2018-12-04 MED ORDER — BUPIVACAINE LIPOSOME 1.3 % IJ SUSP
INTRAMUSCULAR | Status: DC | PRN
  Administered 2018-12-04: 10 mL via PERINEURAL

## 2018-12-04 MED ORDER — DOCUSATE SODIUM 100 MG PO CAPS
100.0000 mg | ORAL_CAPSULE | Freq: Two times a day (BID) | ORAL | Status: DC
  Administered 2018-12-04: 100 mg via ORAL
  Filled 2018-12-04: qty 1

## 2018-12-04 MED ORDER — ONDANSETRON HCL 4 MG/2ML IJ SOLN
INTRAMUSCULAR | Status: AC
  Filled 2018-12-04: qty 2

## 2018-12-04 MED ORDER — PHENOL 1.4 % MT LIQD: 1.0000 | OROMUCOSAL | Status: DC | PRN

## 2018-12-04 MED ORDER — POTASSIUM CHLORIDE IN NACL 20-0.45 MEQ/L-% IV SOLN
INTRAVENOUS | Status: DC
  Administered 2018-12-04: 17:00:00 via INTRAVENOUS
  Filled 2018-12-04 (×2): qty 1000

## 2018-12-04 MED ORDER — CEFAZOLIN SODIUM-DEXTROSE 1-4 GM/50ML-% IV SOLN
1.0000 g | Freq: Four times a day (QID) | INTRAVENOUS | Status: AC
  Administered 2018-12-04 – 2018-12-05 (×3): 1 g via INTRAVENOUS
  Filled 2018-12-04 (×3): qty 50

## 2018-12-04 MED ORDER — MENTHOL 3 MG MT LOZG: 1.0000 | LOZENGE | OROMUCOSAL | Status: DC | PRN

## 2018-12-04 MED ORDER — BISACODYL 10 MG RE SUPP: 10.0000 mg | Freq: Every day | RECTAL | Status: DC | PRN

## 2018-12-04 MED ORDER — ROSUVASTATIN CALCIUM 20 MG PO TABS
20.0000 mg | ORAL_TABLET | Freq: Every day | ORAL | Status: DC
  Administered 2018-12-04: 20 mg via ORAL
  Filled 2018-12-04: qty 1

## 2018-12-04 MED ORDER — ALBUTEROL SULFATE HFA 108 (90 BASE) MCG/ACT IN AERS
INHALATION_SPRAY | RESPIRATORY_TRACT | Status: DC | PRN
  Administered 2018-12-04 (×2): 2 via RESPIRATORY_TRACT

## 2018-12-04 MED ORDER — SUCCINYLCHOLINE CHLORIDE 200 MG/10ML IV SOSY
PREFILLED_SYRINGE | INTRAVENOUS | Status: AC
  Filled 2018-12-04: qty 10

## 2018-12-04 MED ORDER — METHOCARBAMOL 500 MG PO TABS
500.0000 mg | ORAL_TABLET | Freq: Four times a day (QID) | ORAL | Status: DC | PRN
  Administered 2018-12-04: 22:00:00 500 mg via ORAL
  Filled 2018-12-04: qty 1

## 2018-12-04 MED ORDER — PROPOFOL 10 MG/ML IV BOLUS
INTRAVENOUS | Status: AC
  Filled 2018-12-04: qty 20

## 2018-12-04 MED ORDER — DIPHENHYDRAMINE HCL 12.5 MG/5ML PO ELIX: 12.5000 mg | ORAL_SOLUTION | ORAL | Status: DC | PRN

## 2018-12-04 MED ORDER — ONDANSETRON HCL 4 MG PO TABS: 4.0000 mg | ORAL_TABLET | Freq: Three times a day (TID) | ORAL | 0 refills | Status: DC | PRN

## 2018-12-04 MED ORDER — ROCURONIUM BROMIDE 10 MG/ML (PF) SYRINGE
PREFILLED_SYRINGE | INTRAVENOUS | Status: DC | PRN
  Administered 2018-12-04: 80 mg via INTRAVENOUS

## 2018-12-04 MED ORDER — ACETAMINOPHEN 500 MG PO TABS
1000.0000 mg | ORAL_TABLET | Freq: Once | ORAL | Status: AC
  Administered 2018-12-04: 1000 mg via ORAL
  Filled 2018-12-04: qty 2

## 2018-12-04 MED ORDER — HYDROMORPHONE HCL 2 MG PO TABS: 2.0000 mg | ORAL_TABLET | ORAL | 0 refills | Status: DC | PRN

## 2018-12-04 MED ORDER — CEFAZOLIN SODIUM-DEXTROSE 2-4 GM/100ML-% IV SOLN
2.0000 g | INTRAVENOUS | Status: AC
  Administered 2018-12-04: 2 g via INTRAVENOUS
  Filled 2018-12-04: qty 100

## 2018-12-04 MED ORDER — HYDROMORPHONE HCL 1 MG/ML IJ SOLN
INTRAMUSCULAR | Status: AC
  Filled 2018-12-04: qty 1

## 2018-12-04 MED ORDER — POLYETHYLENE GLYCOL 3350 17 G PO PACK
17.0000 g | PACK | Freq: Every day | ORAL | Status: DC | PRN
  Administered 2018-12-05: 17 g via ORAL
  Filled 2018-12-04: qty 1

## 2018-12-04 MED ORDER — METHOCARBAMOL 500 MG IVPB - SIMPLE MED
INTRAVENOUS | Status: AC
  Filled 2018-12-04: qty 50

## 2018-12-04 MED ORDER — PHENYLEPHRINE HCL-NACL 10-0.9 MG/250ML-% IV SOLN
INTRAVENOUS | Status: AC
  Filled 2018-12-04: qty 250

## 2018-12-04 MED ORDER — METOCLOPRAMIDE HCL 5 MG/ML IJ SOLN: 5.0000 mg | Freq: Three times a day (TID) | INTRAMUSCULAR | Status: DC | PRN

## 2018-12-04 SURGICAL SUPPLY — 68 items
ADPR HD STD TPR HUM TI RVRS (Orthopedic Implant) ×1 IMPLANT
AID PSTN UNV HD RSTRNT DISP (MISCELLANEOUS)
BAG SPEC THK2 15X12 ZIP CLS (MISCELLANEOUS) ×1
BAG ZIPLOCK 12X15 (MISCELLANEOUS) ×3 IMPLANT
BIT DRILL QUICK REL 1/8 2PK SL (DRILL) IMPLANT
BIT DRILL QUICK RELEASE PRPHRL (DRILL) IMPLANT
BLADE SAW SGTL 18X1.27X75 (BLADE) ×2 IMPLANT
BLADE SAW SGTL 18X1.27X75MM (BLADE) ×1
CEMENT BONE R 1X40 (Cement) ×5 IMPLANT
CLOSURE STERI-STRIP 1/2X4 (GAUZE/BANDAGES/DRESSINGS) ×1
CLSR STERI-STRIP ANTIMIC 1/2X4 (GAUZE/BANDAGES/DRESSINGS) ×2 IMPLANT
COVER BACK TABLE 60X90IN (DRAPES) ×3 IMPLANT
COVER MAYO STAND STRL (DRAPES) ×3 IMPLANT
COVER SURGICAL LIGHT HANDLE (MISCELLANEOUS) ×3 IMPLANT
COVER WAND RF STERILE (DRAPES) IMPLANT
DECANTER SPIKE VIAL GLASS SM (MISCELLANEOUS) IMPLANT
DRAPE POUCH INSTRU U-SHP 10X18 (DRAPES) ×3 IMPLANT
DRAPE SHEET LG 3/4 BI-LAMINATE (DRAPES) ×3 IMPLANT
DRAPE SURG 17X11 SM STRL (DRAPES) ×3 IMPLANT
DRAPE U-SHAPE 47X51 STRL (DRAPES) ×3 IMPLANT
DRILL QUICK RELEASE 1/8 INCH (DRILL) ×2
DRILL QUICK RELEASE PERIPHERAL (DRILL) ×3
DRSG MEPILEX BORDER 4X8 (GAUZE/BANDAGES/DRESSINGS) ×3 IMPLANT
DURAPREP 26ML APPLICATOR (WOUND CARE) ×3 IMPLANT
ELECT REM PT RETURN 15FT ADLT (MISCELLANEOUS) ×3 IMPLANT
GLENOID PEGGED STRL MED 4MM (Orthopedic Implant) ×2 IMPLANT
GLENOID POST REGENREX HYBRID (Orthopedic Implant) ×2 IMPLANT
GLOVE BIO SURGEON STRL SZ7.5 (GLOVE) ×3 IMPLANT
GLOVE BIO SURGEON STRL SZ8 (GLOVE) ×3 IMPLANT
GLOVE BIOGEL PI IND STRL 8 (GLOVE) ×2 IMPLANT
GLOVE BIOGEL PI INDICATOR 8 (GLOVE) ×4
GOWN STRL REUS W/TWL 2XL LVL3 (GOWN DISPOSABLE) ×3 IMPLANT
GOWN STRL REUS W/TWL LRG LVL3 (GOWN DISPOSABLE) ×3 IMPLANT
HANDPIECE INTERPULSE COAX TIP (DISPOSABLE) ×3
HEAD HUMERAL COMP STD (Orthopedic Implant) IMPLANT
HEAD MODULAR W VARIABLE OFFSET (Orthopedic Implant) IMPLANT
HOOD PEEL AWAY FLYTE STAYCOOL (MISCELLANEOUS) ×9 IMPLANT
HUMERAL HEAD COMP STD (Orthopedic Implant) ×3 IMPLANT
KIT BASIN OR (CUSTOM PROCEDURE TRAY) ×3 IMPLANT
KIT TURNOVER KIT A (KITS) IMPLANT
MODULAR HEAD W VARIABLE OFFSET (Orthopedic Implant) ×3 IMPLANT
NS IRRIG 1000ML POUR BTL (IV SOLUTION) ×3 IMPLANT
PACK SHOULDER (CUSTOM PROCEDURE TRAY) ×3 IMPLANT
PAD COLD SHLDR WRAP-ON (PAD) ×3 IMPLANT
PIN HUMERAL STMN 3.2MMX9IN (INSTRUMENTS) ×2 IMPLANT
PROTECTOR NERVE ULNAR (MISCELLANEOUS) ×3 IMPLANT
RESTRAINT HEAD UNIVERSAL NS (MISCELLANEOUS) IMPLANT
SET HNDPC FAN SPRY TIP SCT (DISPOSABLE) ×1 IMPLANT
SLING ARM IMMOBILIZER LRG (SOFTGOODS) ×3 IMPLANT
SMARTMIX MINI TOWER (MISCELLANEOUS)
SPONGE LAP 18X18 RF (DISPOSABLE) ×2 IMPLANT
STEM HUMERAL STRL 10MMX55MM (Stem) ×2 IMPLANT
SUCTION FRAZIER HANDLE 12FR (TUBING) ×2
SUCTION TUBE FRAZIER 12FR DISP (TUBING) ×1 IMPLANT
SUPPORT WRAP ARM LG (MISCELLANEOUS) ×3 IMPLANT
SUT FIBERWIRE #2 38 REV NDL BL (SUTURE) ×12
SUT FIBERWIRE #2 38 T-5 BLUE (SUTURE) ×6
SUT VIC AB 1 CT1 36 (SUTURE) ×3 IMPLANT
SUT VIC AB 2-0 CT1 27 (SUTURE) ×3
SUT VIC AB 2-0 CT1 TAPERPNT 27 (SUTURE) ×1 IMPLANT
SUT VIC AB 3-0 SH 8-18 (SUTURE) ×3 IMPLANT
SUTURE FIBERWR #2 38 T-5 BLUE (SUTURE) IMPLANT
SUTURE FIBERWR#2 38 REV NDL BL (SUTURE) ×4 IMPLANT
TOWEL OR 17X26 10 PK STRL BLUE (TOWEL DISPOSABLE) ×3 IMPLANT
TOWEL OR NON WOVEN STRL DISP B (DISPOSABLE) ×3 IMPLANT
TOWER SMARTMIX MINI (MISCELLANEOUS) IMPLANT
WATER STERILE IRR 1000ML POUR (IV SOLUTION) ×6 IMPLANT
YANKAUER SUCT BULB TIP 10FT TU (MISCELLANEOUS) ×3 IMPLANT

## 2018-12-04 NOTE — Transfer of Care (Signed)
Immediate Anesthesia Transfer of Care Note  Patient: Brandon Ferrell  Procedure(s) Performed: Procedure(s): TOTAL SHOULDER ARTHROPLASTY (Left)  Patient Location: PACU  Anesthesia Type:General  Level of Consciousness:  sedated, patient cooperative and responds to stimulation  Airway & Oxygen Therapy:Patient Spontanous Breathing and Patient connected to face mask oxgen  Post-op Assessment:  Report given to PACU RN and Post -op Vital signs reviewed and stable  Post vital signs:  Reviewed and stable  Last Vitals:  Vitals:   12/04/18 0922 12/04/18 0923  BP:    Pulse: 63 (!) 57  Resp: 20 17  Temp:    SpO2: 0000000 [id number]    Complications: No apparent anesthesia complications

## 2018-12-04 NOTE — Progress Notes (Signed)
Assisted Dr. Woodrum with left, ultrasound guided, interscalene  block. Side rails up, monitors on throughout procedure. See vital signs in flow sheet. Tolerated Procedure well. 

## 2018-12-04 NOTE — Anesthesia Procedure Notes (Addendum)
Anesthesia Regional Block: Interscalene brachial plexus block   Pre-Anesthetic Checklist: ,, timeout performed, Correct Patient, Correct Site, Correct Laterality, Correct Procedure, Correct Position, site marked, Risks and benefits discussed,  Surgical consent,  Pre-op evaluation,  At surgeon's request and post-op pain management  Laterality: Left  Prep: Maximum Sterile Barrier Precautions used, chloraprep       Needles:  Injection technique: Single-shot  Needle Type: Echogenic Stimulator Needle     Needle Length: 5cm  Needle Gauge: 22     Additional Needles:   Procedures:,,,, ultrasound used (permanent image in chart),,,,  Narrative:  Start time: 12/04/2018 9:09 AM End time: 12/04/2018 9:19 AM Injection made incrementally with aspirations every 5 mL.  Performed by: Personally  Anesthesiologist: Freddrick March, MD  Additional Notes: Monitors applied. No increased pain on injection. No increased resistance to injection. Injection made in 5cc increments. Good needle visualization. Patient tolerated procedure well.

## 2018-12-04 NOTE — Anesthesia Procedure Notes (Signed)
Procedure Name: Intubation Date/Time: 12/04/2018 10:34 AM Performed by: Talbot Grumbling, CRNA Pre-anesthesia Checklist: Patient identified, Emergency Drugs available, Suction available and Patient being monitored Patient Re-evaluated:Patient Re-evaluated prior to induction Oxygen Delivery Method: Circle system utilized Preoxygenation: Pre-oxygenation with 100% oxygen Induction Type: IV induction Ventilation: Mask ventilation without difficulty Laryngoscope Size: 3 and Mac Grade View: Grade I Tube type: Oral Tube size: 7.5 mm Number of attempts: 1 Airway Equipment and Method: Stylet Placement Confirmation: ETT inserted through vocal cords under direct vision,  positive ETCO2 and breath sounds checked- equal and bilateral Secured at: 22 cm Tube secured with: Tape Dental Injury: Teeth and Oropharynx as per pre-operative assessment

## 2018-12-04 NOTE — Op Note (Signed)
12/04/2018  1:00 PM  PATIENT:  Brandon Ferrell    PRE-OPERATIVE DIAGNOSIS: Left shoulder primary localized osteoarthritis  POST-OPERATIVE DIAGNOSIS:  Same  PROCEDURE: LEFT total Shoulder Arthroplasty  SURGEON:  [name], MD  PHYSICIAN ASSISTANT: Joya Gaskins, OPA-C, present and scrubbed throughout the case, critical for completion in a timely fashion, and for retraction, instrumentation, and closure.  Second assistant:Timelie Fletcher Anon, third-year medical student  ANESTHESIA:   General with an interscalene block using Exparel  ESTIMATED BLOOD LOSS: 200 mL  UNIQUE ASPECTS OF THE CASE: Bone quality was overall very good.  Rotator cuff was intact.  Significant osteophyte formation on the humeral head.  All 4 holes were contained within the vault on the glenoid.  During initial placement of the guidepin it redirected a little bit more posteriorly, such that I removed a little bit more bone anterior as compared to posterior, but as I said before, I was contained within the vault with excellent depth and contact.  I augmented the inferior subscapularis repair with #1 Vicryl as well, it was a little awkward to get the correct angles, but the tendon did come down to its original bed.  The biceps tendon repair was to the pectoralis tendon, although the biceps was not as robust as I would have expected given his weightlifting status, we will see if his tenodesis holds.  PREOPERATIVE INDICATIONS:  Brandon Ferrell is a  63 y.o. male who failed conservative measures and elected for surgical management.    The risks benefits and alternatives were discussed with the patient preoperatively including but not limited to the risks of infection, bleeding, nerve injury, cardiopulmonary complications, the need for revision surgery, dislocation, loosening, incomplete relief of pain, among others, and the patient was willing to proceed.   OPERATIVE IMPLANTS: Biomet size 10 micro press-fit humeral stem, size  46+18 Versa-dial humeral head, set in the C position with increased coverage posteriorly, with a medium cemented glenoid polyethylene 3 peg implant with a central regenerex noncemented post.   OPERATIVE FINDINGS: Advanced glenohumeral osteoarthritis involving the glenoid and the humeral head with substantial osteophyte formation inferiorly.   OPERATIVE PROCEDURE: The patient was brought to the operating room and placed in the supine position. General anesthesia was administered. IV antibiotics were given.  The upper extremity was prepped and draped in usual sterile fashion. The patient was in a beachchair position with all bony prominences padded.   Time out was performed and a deltopectoral approach was carried out. The biceps tendon was tenodesed to the pectoralis tendon. The subscapularis was released, tagging it with a #2 FiberWire, leaving a cuff of tendon for repair.   The inferior osteophyte was removed, and release of the capsule off of the humeral side was completed. The head was dislocated, and I reamed sequentially. I placed the humeral cutting guide at 30 of retroversion, and then pinned this into place, and made my humeral neck cut. This was at the appropriate level.   I then placed deep retractors and exposed the glenoid. I excised the labrum circumferentially, taking care to protect the axillary nerve inferiorly.   I then placed a guidewire into the center position, controlling appropriate version and inclination. I then reamed over the guidewire with the small reamer, and was satisfied with the preparation. I preserved the subchondral bone in order to maximize the strength and minimize the risk for subsequent subsidence.   I then drilled the central hole for the regenerex peg, and then placed the guide, and then  drilled the 3 peripheral peg holes. I had excellent bony circumferential contact.   I then cleaned the glenoid, irrigated it copiously, and then dried it and cemented the  prosthesis into place. Excellent seating was achieved. I had full exposure. The cement cured while I turned my attention to the humeral side.   I sequentially broached, up to the selected size, with the broach set at 30 of retroversion. I placed 3 #2 Fiberwire through the bone for subsequent repair.  I then placed the real stem. I trialed with multiple heads, and the above-named component was selected. Increased posterior coverage improved the coverage. The soft tissue tension was appropriate.   I then impacted the real humeral head into place, reduced the head, and irrigated copiously. Excellent stability and range of motion was achieved. I repaired the subscapularis with a total of 5 #2 FiberWire; one for the interval, one for the corner, and then the remaining three from the lesser tuberosity which had already been passed.  Excellent repair achieved and I irrigated copiously once more. The subcutaneous tissue was closed with Vicryl including the deltopectoral fascia.   The skin was closed with Steri-Strips and sterile gauze was applied. He had a preoperative nerve block. He tolerated the procedure well and there were no complications.

## 2018-12-04 NOTE — Discharge Instructions (Signed)
Diet: As you were doing prior to hospitalization   Shower:  May shower but keep the wounds dry, use an occlusive plastic wrap, NO SOAKING IN TUB.  If the bandage gets wet, change with a clean dry gauze.  If you have a splint on, leave the splint in place and keep the splint dry with a plastic bag.  Dressing:  You may change your dressing 3-5 days after surgery, unless you have a splint.  If you have a splint, then just leave the splint in place and we will change your bandages during your first follow-up appointment.    If you had hand or foot surgery, we will plan to remove your stitches in about 2 weeks in the office.  For all other surgeries, there are sticky tapes (steri-strips) on your wounds and all the stitches are absorbable.  Leave the steri-strips in place when changing your dressings, they will peel off with time, usually 2-3 weeks.  Activity:  Increase activity slowly as tolerated, but follow the weight bearing instructions below.  The rules on driving is that you can not be taking narcotics while you drive, and you must feel in control of the vehicle.    Weight Bearing:   No lifting with left arm.  OK for fingers, wrist, hand, and elbow range of motion.  Keep elbow at your side.    To prevent constipation: you may use a stool softener such as -  Colace (over the counter) 100 mg by mouth twice a day  Drink plenty of fluids (prune juice may be helpful) and high fiber foods Miralax (over the counter) for constipation as needed.    Itching:  If you experience itching with your medications, try taking only a single pain pill, or even half a pain pill at a time.  You may take up to 10 pain pills per day, and you can also use benadryl over the counter for itching or also to help with sleep.   Precautions:  If you experience chest pain or shortness of breath - call 911 immediately for transfer to the hospital emergency department!!  If you develop a fever greater that 101 F, purulent  drainage from wound, increased redness or drainage from wound, or calf pain -- Call the office at [phone]                                                Follow- Up Appointment:  Please call for an appointment to be seen in 2 weeks [place] - [phone]

## 2018-12-04 NOTE — Anesthesia Preprocedure Evaluation (Addendum)
Anesthesia Evaluation  Patient identified by MRN, date of birth, ID band Patient awake    Reviewed: Allergy & Precautions, NPO status , Patient's Chart, lab work & pertinent test results  Airway Mallampati: II  TM Distance: >3 FB Neck ROM: Full    Dental no notable dental hx. (+) Edentulous Upper, Edentulous Lower   Pulmonary asthma , COPD,  COPD inhaler, Current Smoker,    Pulmonary exam normal breath sounds clear to auscultation       Cardiovascular hypertension, Normal cardiovascular exam Rhythm:Regular Rate:Normal  HLD  TTE 2007 EF 55-60%, no valvular abnormalities   Neuro/Psych negative neurological ROS  negative psych ROS   GI/Hepatic Neg liver ROS, GERD  Controlled and Medicated,  Endo/Other  negative endocrine ROS  Renal/GU Renal InsufficiencyRenal disease  negative genitourinary   Musculoskeletal  (+) Arthritis ,   Abdominal   Peds  Hematology negative hematology ROS (+)   Anesthesia Other Findings   Reproductive/Obstetrics                            Anesthesia Physical Anesthesia Plan  ASA: III  Anesthesia Plan: General and Regional   Post-op Pain Management:  Regional for Post-op pain   Induction: Intravenous  PONV Risk Score and Plan: 1 and Midazolam, Dexamethasone and Ondansetron  Airway Management Planned: Oral ETT  Additional Equipment:   Intra-op Plan:   Post-operative Plan: Extubation in OR  Informed Consent: I have reviewed the patients History and Physical, chart, labs and discussed the procedure including the risks, benefits and alternatives for the proposed anesthesia with the patient or authorized representative who has indicated his/her understanding and acceptance.     Dental advisory given  Plan Discussed with: CRNA  Anesthesia Plan Comments:         Anesthesia Quick Evaluation

## 2018-12-04 NOTE — H&P (Signed)
PREOPERATIVE H&P  Chief Complaint: left shoulder pain  HPI: Brandon Ferrell is a 63 y.o. male who presents for preoperative history and physical with a diagnosis of left shoulder primary localized osteoarthritis. Symptoms are rated as moderate to severe, and have been worsening.  This is significantly impairing activities of daily living.  He has elected for surgical management.   He has failed injections, activity modification, anti-inflammatories, and assistive devices.  Preoperative X-rays demonstrate end stage degenerative changes with osteophyte formation, loss of joint space, subchondral sclerosis. The rotator cuff looks okay based on MRI  Past Medical History:  Diagnosis Date  . Acute renal insufficiency 07/18/2015  . Acute urinary retention 07/18/2015  . Arthritis   . Asthma    as a child  . COPD (chronic obstructive pulmonary disease) ([place])   . GERD (gastroesophageal reflux disease)    occ zantac  . Hx of adenomatous colonic polyps 08/30/2017  . Hyperlipidemia   . Hypertension   . Palpitations   . Pneumonia 2017   hx   Past Surgical History:  Procedure Laterality Date  . CERVICAL FUSION    . GROIN EXPLORATION Right 07/03/15   cyst removed- local anesthesia [place]  . LUNG BIOPSY  13   pneum, dx copd  . TOTAL HIP ARTHROPLASTY Right 11/07/2014   Procedure: RIGHT  TOTAL HIP ARTHROPLASTY;  Surgeon: Earlie Server, MD;  Location: [facility];  Service: Orthopedics;  Laterality: Right;  Would like to flip if possible    . TOTAL HIP ARTHROPLASTY Left 07/17/2015   Procedure: LEFT TOTAL HIP ARTHROPLASTY;  Surgeon: Earlie Server, MD;  Location: [facility];  Service: Orthopedics;  Laterality: Left;  Marland Kitchen VASECTOMY  5/15   Social History   Socioeconomic History  . Marital status: Married    Spouse name: Not on file  . Number of children: Not on file  . Years of education: Not on file  . Highest education level: Not on file  Occupational History  . Not on file  Social Needs  .  Financial resource strain: Not on file  . Food insecurity    Worry: Not on file    Inability: Not on file  . Transportation needs    Medical: Not on file    Non-medical: Not on file  Tobacco Use  . Smoking status: Current Some Day Smoker    Packs/day: 1.00    Years: 40.00    Pack years: 40.00    Types: Cigars  . Smokeless tobacco: Never Used  Substance and Sexual Activity  . Alcohol use: No  . Drug use: No  . Sexual activity: Not on file  Lifestyle  . Physical activity    Days per week: Not on file    Minutes per session: Not on file  . Stress: Not on file  Relationships  . Social Herbalist on phone: Not on file    Gets together: Not on file    Attends religious service: Not on file    Active member of club or organization: Not on file    Attends meetings of clubs or organizations: Not on file    Relationship status: Not on file  Other Topics Concern  . Not on file  Social History Narrative  . Not on file   Family History  Problem Relation Age of Onset  . Colon cancer Maternal Grandfather   . Stomach cancer Father   . Colon polyps Neg Hx   . Esophageal cancer Neg Hx   .  Rectal cancer Neg Hx    Allergies  Allergen Reactions  . Bee Venom Anaphylaxis  . Shellfish Allergy Anaphylaxis and Hives    Lobester, Spider Bites   Prior to Admission medications   Medication Sig Start Date End Date Taking? Authorizing Provider  albuterol (PROVENTIL HFA;VENTOLIN HFA) 108 (90 BASE) MCG/ACT inhaler Inhale 2 puffs into the lungs every 6 (six) hours as needed for wheezing or shortness of breath.   Yes [provider]  amLODipine (NORVASC) 5 MG tablet Take 5 mg by mouth daily.   Yes [provider]  budesonide-formoterol (SYMBICORT) 160-4.5 MCG/ACT inhaler Inhale 2 puffs into the lungs 2 (two) times daily.   Yes [provider]  gabapentin (NEURONTIN) 300 MG capsule Take 300 mg by mouth at bedtime.  08/15/17  Yes [provider]   oxyCODONE (OXY IR/ROXICODONE) 5 MG immediate release tablet Take 5 mg by mouth 3 (three) times daily as needed (pain).  08/15/17  Yes [provider]  rosuvastatin (CRESTOR) 20 MG tablet Take 20 mg by mouth daily.  07/24/17  Yes [provider]     Positive ROS: All other systems have been reviewed and were otherwise negative with the exception of those mentioned in the HPI and as above.  Physical Exam: General: Alert, no acute distress Cardiovascular: No pedal edema Respiratory: No cyanosis, no use of accessory musculature GI: No organomegaly, abdomen is soft and non-tender Skin: No lesions in the area of chief complaint Neurologic: Sensation intact distally Psychiatric: Patient is competent for consent with normal mood and affect Lymphatic: No axillary or cervical lymphadenopathy  MUSCULOSKELETAL: Left shoulder active motion 0 to 100 degrees external rotation at 30 degrees positive crepitance.  Internal rotation to L5 level at best.  Assessment: Left shoulder primary localized osteoarthritis with chronic opioid dependency.   Plan: Plan for Procedure(s): TOTAL SHOULDER ARTHROPLASTY  The risks benefits and alternatives were discussed with the patient including but not limited to the risks of nonoperative treatment, versus surgical intervention including infection, bleeding, nerve injury,  blood clots, cardiopulmonary complications, morbidity, mortality, among others, and they were willing to proceed.   Anticipated LOS equal to or greater than 2 midnights due to - Age 30 and older with one or more of the following:  - Obesity  - Expected need for hospital services (PT, OT, Nursing) required for safe  discharge  - Anticipated need for postoperative skilled nursing care or inpatient rehab  - Active co-morbidities: Chronic pain requiring opiods OR   - Unanticipated findings during/Post Surgery: None  - Patient is a high risk of re-admission due to:  None     [name], MD Cell [phone]   12/04/2018 9:47 AM

## 2018-12-05 ENCOUNTER — Other Ambulatory Visit: Payer: Self-pay

## 2018-12-05 ENCOUNTER — Encounter (HOSPITAL_COMMUNITY): Payer: Self-pay | Admitting: Orthopedic Surgery

## 2018-12-05 LAB — BASIC METABOLIC PANEL
Anion gap: 10 (ref 5–15)
BUN: 15 mg/dL (ref 8–23)
CO2: 21 mmol/L — ABNORMAL LOW (ref 22–32)
Calcium: 8.7 mg/dL — ABNORMAL LOW (ref 8.9–10.3)
Chloride: 105 mmol/L (ref 98–111)
Creatinine, Ser: 1.09 mg/dL (ref 0.61–1.24)
GFR calc Af Amer: >60 mL/min (ref >60)
GFR calc non Af Amer: >60 mL/min (ref >60)
Glucose, Bld: 146 mg/dL — ABNORMAL HIGH (ref 70–99)
Potassium: 4 mmol/L (ref 3.5–5.1)
Sodium: 136 mmol/L (ref 135–145)

## 2018-12-05 LAB — CBC
HCT: 37.1 % — ABNORMAL LOW (ref 39.0–52.0)
Hemoglobin: 12.3 g/dL — ABNORMAL LOW (ref 13.0–17.0)
MCH: 30.2 pg (ref 26.0–34.0)
MCHC: 33.2 g/dL (ref 30.0–36.0)
MCV: 91.2 fL (ref 80.0–100.0)
Platelets: 145 10*3/uL — ABNORMAL LOW (ref 150–400)
RBC: 4.07 MIL/uL — ABNORMAL LOW (ref 4.22–5.81)
RDW: 14 % (ref 11.5–15.5)
WBC: 11.9 10*3/uL — ABNORMAL HIGH (ref 4.0–10.5)
nRBC: 0 % (ref 0.0–0.2)

## 2018-12-05 NOTE — Evaluation (Signed)
Occupational Therapy Evaluation Patient Details Name: Brandon Ferrell MRN: [id number] DOB: 1955/08/06 Today's Date: 12/05/2018    History of Present Illness s/p L TSA   Clinical Impression   This 63 year old man was admitted for the above sx. All education was completed. Pt will follow up with Dr Mardelle Matte for further rehab.    Follow Up Recommendations  Follow surgeon's recommendation for DC plan and follow-up therapies    Equipment Recommendations  None recommended by OT    Recommendations for Other Services       Precautions / Restrictions Precautions Precautions: Shoulder Type of Shoulder Precautions: sling on except for bathing, dressing and exercises.  May move elbow wrist and fingers; no shoulder ROM Shoulder Interventions: Shoulder sling/immobilizer Precaution Booklet Issued: Yes (comment) Restrictions LUE Weight Bearing: Non weight bearing      Mobility Bed Mobility               General bed mobility comments: pt sitting EOB  Transfers Overall transfer level: Independent                    Balance                                           ADL either performed or assessed with clinical judgement   ADL Overall ADL's : Needs assistance/impaired Eating/Feeding: Set up   Grooming: Set up   Upper Body Bathing: Moderate assistance   Lower Body Bathing: Minimal assistance;Moderate assistance   Upper Body Dressing : Moderate assistance   Lower Body Dressing: Maximal assistance   Toilet Transfer: Independent(to chair)   Toileting- Clothing Manipulation and Hygiene: Minimal assistance         General ADL Comments: performed ADL, educated on protocol and allowed distal exercises.  Educated on ice machine also.  Pt wore overhead loose v-neck shirt.  Also educated on this.  Pt feels wife will be able to manage sling and assist as needed.     Vision         Perception     Praxis      Pertinent Vitals/Pain Pain  Assessment: No/denies pain(block in effect)     Hand Dominance Right   Extremity/Trunk Assessment Upper Extremity Assessment Upper Extremity Assessment: LUE deficits/detail(able to move fingers)           Communication Communication Communication: No difficulties   Cognition Arousal/Alertness: Awake/alert Behavior During Therapy: WFL for tasks assessed/performed Overall Cognitive Status: Within Functional Limits for tasks assessed                                     General Comments       Exercises     Shoulder Instructions      Home Living Family/patient expects to be discharged to:: Private residence Living Arrangements: Spouse/significant other;Children   Type of Home: Mobile home             Bathroom Shower/Tub: Teacher, early years/pre: Standard                Prior Functioning/Environment Level of Independence: Independent                 OT Problem List:        OT Treatment/Interventions:  OT Goals(Current goals can be found in the care plan section) Acute Rehab OT Goals Patient Stated Goal: return to independence OT Goal Formulation: All assessment and education complete, DC therapy  OT Frequency:     Barriers to D/C:            Co-evaluation              AM-PAC OT "6 Clicks" Daily Activity     Outcome Measure Help from another person eating meals?: A Little Help from another person taking care of personal grooming?: A Little Help from another person toileting, which includes using toliet, bedpan, or urinal?: A Little Help from another person bathing (including washing, rinsing, drying)?: A Lot Help from another person to put on and taking off regular upper body clothing?: A Lot Help from another person to put on and taking off regular lower body clothing?: A Lot 6 Click Score: 15   End of Session    Activity Tolerance: Patient tolerated treatment well Patient left: in chair;with call  bell/phone within reach  OT Visit Diagnosis: Muscle weakness (generalized) (M62.81)                Time: [id number] OT Time Calculation (min): 35 min Charges:  OT General Charges $OT Visit: 1 Visit OT Evaluation $OT Eval Low Complexity: 1 Low OT Treatments $Self Care/Home Management : 8-22 mins  Lesle Chris, OTR/L Acute Rehabilitation Services [phone] [facility] pager [phone] office 12/05/2018  Roe Wilner 12/05/2018, 9:53 AM

## 2018-12-05 NOTE — Discharge Summary (Signed)
Physician Discharge Summary  Patient ID: Brandon Ferrell MRN: [id number] DOB/AGE: 1955/07/14 63 y.o.  Admit date: 12/04/2018 Discharge date: 12/05/2018  Admission Diagnoses:  Primary localized osteoarthrosis of left shoulder  Discharge Diagnoses:  Principal Problem:   Primary localized osteoarthrosis of left shoulder Active Problems:   Localized primary osteoarthritis of left shoulder region   Past Medical History:  Diagnosis Date  . Acute renal insufficiency 07/18/2015  . Acute urinary retention 07/18/2015  . Arthritis   . Asthma    as a child  . COPD (chronic obstructive pulmonary disease) ([place])   . GERD (gastroesophageal reflux disease)    occ zantac  . Hx of adenomatous colonic polyps 08/30/2017  . Hyperlipidemia   . Hypertension   . Palpitations   . Pneumonia 2017   hx  . Primary localized osteoarthrosis of left shoulder 12/04/2018    Surgeries: Procedure(s): TOTAL SHOULDER ARTHROPLASTY on 12/04/2018   Consultants (if any):   Discharged Condition: Improved  Hospital Course: Brandon Ferrell is an 63 y.o. male who was admitted 12/04/2018 with a diagnosis of Primary localized osteoarthrosis of left shoulder and went to the operating room on 12/04/2018 and underwent the above named procedures.  His radial nerve was working postop day 1.   He was given perioperative antibiotics:  Anti-infectives (From admission, onward)   Start     Dose/Rate Route Frequency Ordered Stop   12/04/18 1630  ceFAZolin (ANCEF) IVPB 1 g/50 mL premix     1 g 100 mL/hr over 30 Minutes Intravenous Every 6 hours 12/04/18 1514 12/05/18 0600   12/04/18 0745  ceFAZolin (ANCEF) IVPB 2g/100 mL premix     2 g 200 mL/hr over 30 Minutes Intravenous On call to O.R. 12/04/18 [id number] 12/04/18 1050    .  He was given sequential compression devices, early ambulation for DVT prophylaxis.  He benefited maximally from the hospital stay and there were no complications.    Recent vital signs:  Vitals:   12/05/18  0140 12/05/18 0547  BP: (!) 151/86 140/83  Pulse: (!) 58 (!) 52  Resp: 16 18  Temp: 98 F (36.7 C) 98 F (36.7 C)  SpO2: 99% 100%    Recent laboratory studies:  Lab Results  Component Value Date   HGB 12.3 (L) 12/05/2018   HGB 15.0 11/30/2018   HGB 9.5 (L) 07/20/2015   Lab Results  Component Value Date   WBC 11.9 (H) 12/05/2018   PLT 145 (L) 12/05/2018   Lab Results  Component Value Date   INR 1.04 07/06/2015   Lab Results  Component Value Date   NA 136 12/05/2018   K 4.0 12/05/2018   CL 105 12/05/2018   CO2 21 (L) 12/05/2018   BUN 15 12/05/2018   CREATININE 1.09 12/05/2018   GLUCOSE 146 (H) 12/05/2018    Discharge Medications:   Allergies as of 12/05/2018      Reactions   Bee Venom Anaphylaxis   Shellfish Allergy Anaphylaxis, Hives   Lobester, Spider Bites      Medication List    TAKE these medications   albuterol 108 (90 Base) MCG/ACT inhaler Commonly known as: VENTOLIN HFA Inhale 2 puffs into the lungs every 6 (six) hours as needed for wheezing or shortness of breath.   amLODipine 5 MG tablet Commonly known as: NORVASC Take 5 mg by mouth daily.   baclofen 10 MG tablet Commonly known as: LIORESAL Take 1 tablet (10 mg total) by mouth 3 (three) times daily. As needed for muscle  spasm   budesonide-formoterol 160-4.5 MCG/ACT inhaler Commonly known as: SYMBICORT Inhale 2 puffs into the lungs 2 (two) times daily.   gabapentin 300 MG capsule Commonly known as: NEURONTIN Take 300 mg by mouth at bedtime.   HYDROmorphone 2 MG tablet Commonly known as: Dilaudid Take 1 tablet (2 mg total) by mouth every 4 (four) hours as needed for severe pain.   ondansetron 4 MG tablet Commonly known as: Zofran Take 1 tablet (4 mg total) by mouth every 8 (eight) hours as needed for nausea or vomiting.   oxyCODONE 5 MG immediate release tablet Commonly known as: Oxy IR/ROXICODONE Take 5 mg by mouth 3 (three) times daily as needed (pain).   rosuvastatin 20 MG  tablet Commonly known as: CRESTOR Take 20 mg by mouth daily.   sennosides-docusate sodium 8.6-50 MG tablet Commonly known as: SENOKOT-S Take 2 tablets by mouth daily.       Diagnostic Studies: Dg Shoulder Left Port  Result Date: 12/04/2018 CLINICAL DATA:  Postoperative EXAM: LEFT SHOULDER - 1 VIEW COMPARISON:  10/18/2018 FINDINGS: Status post interval left shoulder total arthroplasty. No evidence perihardware fracture or component malalignment. The partially imaged left chest is unremarkable. IMPRESSION: Status post interval left shoulder total arthroplasty. No evidence perihardware fracture or component malalignment. Electronically Signed   By: Eddie Candle M.D.   On: 12/04/2018 14:23    Disposition:     Follow-up Information    Marchia Bond, MD. Schedule an appointment as soon as possible for a visit in 2 weeks.   Specialty: Orthopedic Surgery Contact information: [address] [place] [phone]            Signed: [name] 12/05/2018, 7:45 AM

## 2018-12-05 NOTE — Anesthesia Postprocedure Evaluation (Signed)
Anesthesia Post Note  Patient: Brandon Ferrell  Procedure(s) Performed: TOTAL SHOULDER ARTHROPLASTY (Left Shoulder)     Patient location during evaluation: PACU Anesthesia Type: Regional and General Level of consciousness: awake and alert Pain management: pain level controlled Vital Signs Assessment: post-procedure vital signs reviewed and stable Respiratory status: spontaneous breathing, nonlabored ventilation, respiratory function stable and patient connected to nasal cannula oxygen Cardiovascular status: blood pressure returned to baseline and stable Postop Assessment: no apparent nausea or vomiting Anesthetic complications: no    Last Vitals:  Vitals:   12/05/18 0547 12/05/18 0933  BP: 140/83 (!) 166/90  Pulse: (!) 52 (!) 59  Resp: 18 18  Temp: 36.7 C 36.8 C  SpO2: 100% 98%    Last Pain:  Vitals:   12/05/18 0954  TempSrc:   PainSc: 3                  Christiana Gurevich L [name]

## 2019-06-04 ENCOUNTER — Other Ambulatory Visit (HOSPITAL_COMMUNITY): Payer: Self-pay | Admitting: Family Medicine

## 2019-06-04 DIAGNOSIS — N644 Mastodynia: Secondary | ICD-10-CM

## 2019-06-18 ENCOUNTER — Encounter (HOSPITAL_COMMUNITY): Payer: Medicare Other

## 2019-06-18 ENCOUNTER — Encounter (HOSPITAL_COMMUNITY): Payer: Self-pay

## 2019-06-18 ENCOUNTER — Ambulatory Visit (HOSPITAL_COMMUNITY): Admission: RE | Admit: 2019-06-18 | Payer: Medicare Other | Source: Ambulatory Visit

## 2019-06-18 ENCOUNTER — Ambulatory Visit (HOSPITAL_COMMUNITY): Payer: Medicare Other | Attending: Family Medicine

## 2019-07-16 ENCOUNTER — Other Ambulatory Visit: Payer: Self-pay

## 2019-07-16 ENCOUNTER — Ambulatory Visit (HOSPITAL_COMMUNITY)
Admission: RE | Admit: 2019-07-16 | Discharge: 2019-07-16 | Disposition: A | Discharge status: Home / Self Care | Payer: Medicare Other | Source: Ambulatory Visit | Attending: Family Medicine | Admitting: Family Medicine

## 2019-07-16 DIAGNOSIS — N644 Mastodynia: Secondary | ICD-10-CM

## 2019-08-08 ENCOUNTER — Ambulatory Visit (HOSPITAL_COMMUNITY)
Admission: RE | Admit: 2019-08-08 | Discharge: 2019-08-08 | Disposition: A | Discharge status: Home / Self Care | Payer: Medicare Other | Source: Ambulatory Visit | Attending: Family Medicine | Admitting: Family Medicine

## 2019-08-08 ENCOUNTER — Other Ambulatory Visit: Payer: Self-pay | Admitting: Family Medicine

## 2019-08-08 ENCOUNTER — Other Ambulatory Visit: Payer: Self-pay

## 2019-08-08 DIAGNOSIS — R160 Hepatomegaly, not elsewhere classified: Secondary | ICD-10-CM

## 2019-08-08 MED ORDER — GADOBUTROL 1 MMOL/ML IV SOLN
10.0000 mL | Freq: Once | INTRAVENOUS | Status: AC | PRN
  Administered 2019-08-08: 12:00:00 10 mL via INTRAVENOUS

## 2019-09-19 ENCOUNTER — Ambulatory Visit (INDEPENDENT_AMBULATORY_CARE_PROVIDER_SITE_OTHER): Payer: Medicare Other | Admitting: General Surgery

## 2019-09-19 ENCOUNTER — Other Ambulatory Visit: Payer: Self-pay

## 2019-09-19 ENCOUNTER — Encounter: Payer: Self-pay | Admitting: General Surgery

## 2019-09-19 ENCOUNTER — Encounter (INDEPENDENT_AMBULATORY_CARE_PROVIDER_SITE_OTHER): Payer: Self-pay

## 2019-09-19 VITALS — BP 144/83 | HR 64 | Temp 98.4°F | Resp 14 | Ht 71.0 in | Wt 234.0 lb

## 2019-09-19 DIAGNOSIS — Z86010 Personal history of colonic polyps: Secondary | ICD-10-CM

## 2019-09-19 DIAGNOSIS — I10 Essential (primary) hypertension: Secondary | ICD-10-CM

## 2019-09-19 DIAGNOSIS — M19012 Primary osteoarthritis, left shoulder: Secondary | ICD-10-CM

## 2019-09-19 DIAGNOSIS — M1612 Unilateral primary osteoarthritis, left hip: Secondary | ICD-10-CM

## 2019-09-19 DIAGNOSIS — N62 Hypertrophy of breast: Secondary | ICD-10-CM | POA: Diagnosis not present

## 2019-09-19 DIAGNOSIS — E785 Hyperlipidemia, unspecified: Secondary | ICD-10-CM

## 2019-09-19 DIAGNOSIS — H524 Presbyopia: Secondary | ICD-10-CM

## 2019-09-19 DIAGNOSIS — F17200 Nicotine dependence, unspecified, uncomplicated: Secondary | ICD-10-CM

## 2019-09-19 DIAGNOSIS — M1611 Unilateral primary osteoarthritis, right hip: Secondary | ICD-10-CM

## 2019-09-19 DIAGNOSIS — G4700 Insomnia, unspecified: Secondary | ICD-10-CM

## 2019-09-19 NOTE — Patient Instructions (Signed)
Gynecomastia, Adult Gynecomastia is an overgrowth of gland tissue in a man's breasts. This may cause one or both breasts to become enlarged. The condition often develops in men who have an imbalance of the male sex hormone (testosterone) and the male sex hormone (estrogen). This means that a man may have too much estrogen, too little testosterone, or both. Gynecomastia may be a normal part of aging for some men. It can also happen to adolescent boys during puberty. What are the causes? This condition may be caused by:  Certain medicines, such as: ? Estrogen supplements and medicines that act like estrogen in the body. ? Medicines that keep testosterone from functioning normally in the body (testosterone-inhibiting drugs). ? Anabolic steroids. ? Medicines to treat heartburn, cancer, heart disease, mental health problems, HIV, or AIDS. ? Antibiotic medicine. ? Chemotherapy medicine.  Recreational drugs, including alcohol, marijuana, and opioids.  Herbal products, including lavender and tea tree oil.  A gene that is passed from parent to child (inherited).  Certain medical conditions, such as: ? Tumors in the pituitary or adrenal gland. ? An overactive thyroid gland. ? Certain inherited disorders, including a genetic disease that causes low testosterone in males (Klinefelter syndrome). ? Cancer of the lung, kidney, liver, testicle, or gastrointestinal tract. ? Conditions that cause liver or kidney failure. ? Poor nutrition and starvation. ? Testicle shrinking or failure (testicularatrophy). In some cases, the cause may not be known. What increases the risk? The following factors may make you more likely to develop this condition:  Being 50 years old or older.  Being overweight.  Abusing alcohol or other drugs.  Having a family history of gynecomastia. What are the signs or symptoms? In most cases, breast enlargement is the only symptom. The enlargement may start near the nipple,  and the breast tissue may feel firm and rubbery. Other symptoms may include:  Pain or tenderness in the breasts.  Itchy breasts. How is this diagnosed? This condition may be diagnosed based on:  Your symptoms and medical history.  A physical exam.  Imaging tests, such as: ? An ultrasound. ? A mammogram. ? An MRI.  Blood tests.  Removal of a sample of breast tissue to be tested in a lab (biopsy). How is this treated? This condition may go away on its own, without treatment. If gynecomastia is caused by a medical problem or drug abuse, treatment may include:  Getting treatment for the underlying medical problem or for drug abuse.  Changing or stopping medicines.  Medicines to block the effects of estrogen.  Taking a testosterone replacement.  Surgery to remove breast tissue or any lumps in your breasts.  Breast reduction surgery. This may be an option if you have severe or painful gynecomastia. Follow these instructions at home:   Take over-the-counter and prescription medicines only as told by your health care provider.  Talk to your health care provider before taking any herbal medicines or diet supplements.  Do not abuse drugs or alcohol.  Keep all follow-up visits as told by your health care provider. This is important. Contact a health care provider if:  Your breast tissue grows larger or gets more swollen or painful.  You have a lump in your testicle.  You have blood or discharge coming from your nipples.  Your nipple changes shape.  You develop a hard or painful lump in your breast. Summary  Gynecomastia is an overgrowth of gland tissue in a man's breasts. This may cause one or both breasts to become   enlarged.  In most cases, breast enlargement is the only symptom. The enlargement may start near the nipple, and the breast tissue may feel firm and rubbery.  This condition may go away on its own, without treatment. In some cases, treatment for an  underlying medical problem or for drug abuse may be needed.  Take over-the-counter and prescription medicines only as told by your health care provider.  Do not abuse drugs or alcohol. This information is not intended to replace advice given to you by your health care provider. Make sure you discuss any questions you have with your health care provider. Document Revised: 09/13/2018 Document Reviewed: 09/13/2018 Elsevier Patient Education  2020 [employer]  

## 2019-09-19 NOTE — Progress Notes (Signed)
Brandon Ferrell; [id number]; September 21, 1955   HPI Patient is a 64 year old black male who was referred to my care by Dr. Cindie Laroche for evaluation and treatment of gynecomastia.  Patient has had intermittent tenderness and swelling behind the left nipple for approximately 6 months.  It seems to come and go.  No drainage has been noted from the nipple.  He has noticed bilateral breast enlargement over the past few years.  He did have a mammogram earlier this year that was negative.  They did note left sided mild gynecomastia.  No malignancy was seen.  There is no family history of breast cancer.  He is on multiple medications.  He currently has no breast pain. Past Medical History:  Diagnosis Date   Acute renal insufficiency 07/18/2015   Acute urinary retention 07/18/2015   Arthritis    Asthma    as a child   COPD (chronic obstructive pulmonary disease) (HCC)    GERD (gastroesophageal reflux disease)    occ zantac   Hx of adenomatous colonic polyps 08/30/2017   Hyperlipidemia    Hypertension    Palpitations    Pneumonia 2017   hx   Primary localized osteoarthrosis of left shoulder 12/04/2018    Past Surgical History:  Procedure Laterality Date   CERVICAL FUSION     GROIN EXPLORATION Right 07/03/15   cyst removed- local anesthesia [place]   LUNG BIOPSY  13   pneum, dx copd   TOTAL HIP ARTHROPLASTY Right 11/07/2014   Procedure: RIGHT  TOTAL HIP ARTHROPLASTY;  Surgeon: Earlie Server, MD;  Location: [facility];  Service: Orthopedics;  Laterality: Right;  Would like to flip if possible     TOTAL HIP ARTHROPLASTY Left 07/17/2015   Procedure: LEFT TOTAL HIP ARTHROPLASTY;  Surgeon: Earlie Server, MD;  Location: [facility];  Service: Orthopedics;  Laterality: Left;   TOTAL SHOULDER ARTHROPLASTY Left 12/04/2018   Procedure: TOTAL SHOULDER ARTHROPLASTY;  Surgeon: Marchia Bond, MD;  Location: [facility];  Service: Orthopedics;  Laterality: Left;   VASECTOMY  5/15    Family History  Problem  Relation Age of Onset   Colon cancer Maternal Grandfather    Stomach cancer Father    Colon polyps Neg Hx    Esophageal cancer Neg Hx    Rectal cancer Neg Hx     Current Outpatient Medications on File Prior to Visit  Medication Sig Dispense Refill   albuterol (PROVENTIL HFA;VENTOLIN HFA) 108 (90 BASE) MCG/ACT inhaler Inhale 2 puffs into the lungs every 6 (six) hours as needed for wheezing or shortness of breath.     amLODipine (NORVASC) 5 MG tablet Take 5 mg by mouth daily.     baclofen (LIORESAL) 10 MG tablet Take 1 tablet (10 mg total) by mouth 3 (three) times daily. As needed for muscle spasm 50 tablet 0   Fluticasone-Umeclidin-Vilant (TRELEGY ELLIPTA) 200-62.5-25 MCG/INH AEPB Inhale 1 Inhaler into the lungs in the morning.     gabapentin (NEURONTIN) 300 MG capsule Take 300 mg by mouth at bedtime.   5   ondansetron (ZOFRAN) 4 MG tablet Take 1 tablet (4 mg total) by mouth every 8 (eight) hours as needed for nausea or vomiting. 10 tablet 0   Oxycodone HCl 10 MG TABS Take 1 tablet by mouth as needed. RX'd by Dr. Frances Furbish     rosuvastatin (CRESTOR) 20 MG tablet Take 20 mg by mouth daily.      sennosides-docusate sodium (SENOKOT-S) 8.6-50 MG tablet Take 2 tablets by mouth daily. 30 tablet 1  No current facility-administered medications on file prior to visit.    Allergies  Allergen Reactions   Bee Venom Anaphylaxis   Shellfish Allergy Anaphylaxis and Hives    Lobester, Spider Bites    Social History   Substance and Sexual Activity  Alcohol Use No    Social History   Tobacco Use  Smoking Status Current Some Day Smoker   Packs/day: 1.00   Years: 40.00   Pack years: 40.00   Types: Cigars  Smokeless Tobacco Never Used    Review of Systems  Constitutional: Negative.   HENT: Positive for sinus pain.   Eyes: Positive for blurred vision.  Respiratory: Positive for cough and shortness of breath.   Cardiovascular: Negative.   Gastrointestinal: Positive  for heartburn.  Genitourinary: Negative.   Musculoskeletal: Positive for back pain and joint pain.  Skin: Negative.   Neurological: Positive for sensory change.  Endo/Heme/Allergies: Negative.   Psychiatric/Behavioral: Negative.     Objective   Vitals:   09/19/19 1019  BP: (!) 144/83  Pulse: 64  Resp: 14  Temp: 98.4 F (36.9 C)  SpO2: 95%    Physical Exam Vitals reviewed.  Constitutional:      Appearance: Normal appearance. He is not ill-appearing.  HENT:     Head: Normocephalic and atraumatic.  Cardiovascular:     Rate and Rhythm: Normal rate and regular rhythm.     Heart sounds: Normal heart sounds. No murmur heard.  No friction rub. No gallop.   Pulmonary:     Effort: Pulmonary effort is normal. No respiratory distress.     Breath sounds: Normal breath sounds. No stridor. No wheezing, rhonchi or rales.  Skin:    General: Skin is warm and dry.  Neurological:     Mental Status: He is alert and oriented to person, place, and time.   Breast: Bilateral breast enlargement noted.  No dominant mass, nipple discharge, or dimpling in either breast.  Mild increased density is noted behind the left nipple, but is nontender at this time.  No axillary lymphadenopathy is noted.  Mammogram report from 2021 reviewed.  Assessment  Left gynecomastia, minimally symptomatic at this time. Plan   No need for acute surgical intervention at this time.  I told the patient that should he continue to have worsening or more frequent episodes of left breast pain behind the nipple, I can excise the retroareolar area for symptomatic relief.  He understands this and agrees.  Literature was given concerning gynecomastia.  The etiology of his gynecomastia is multifactorial in nature.  Follow-up as needed.

## 2019-11-21 ENCOUNTER — Other Ambulatory Visit (HOSPITAL_COMMUNITY): Payer: Self-pay | Admitting: Family Medicine

## 2019-11-21 ENCOUNTER — Other Ambulatory Visit: Payer: Self-pay

## 2019-11-21 ENCOUNTER — Ambulatory Visit (HOSPITAL_COMMUNITY)
Admission: RE | Admit: 2019-11-21 | Discharge: 2019-11-21 | Disposition: A | Discharge status: Home / Self Care | Payer: Medicare Other | Source: Ambulatory Visit | Attending: Family Medicine | Admitting: Family Medicine

## 2019-11-21 DIAGNOSIS — J449 Chronic obstructive pulmonary disease, unspecified: Secondary | ICD-10-CM | POA: Diagnosis not present

## 2020-01-20 ENCOUNTER — Other Ambulatory Visit (HOSPITAL_COMMUNITY): Payer: Medicare Other

## 2020-01-23 ENCOUNTER — Ambulatory Visit (HOSPITAL_BASED_OUTPATIENT_CLINIC_OR_DEPARTMENT_OTHER): Admission: RE | Admit: 2020-01-23 | Payer: Medicare Other | Source: Home / Self Care | Admitting: Orthopaedic Surgery

## 2020-01-23 ENCOUNTER — Encounter (HOSPITAL_BASED_OUTPATIENT_CLINIC_OR_DEPARTMENT_OTHER): Admission: RE | Payer: Self-pay | Source: Home / Self Care

## 2020-01-23 SURGERY — TENODESIS, BICEPS
Anesthesia: Choice | Laterality: Left

## 2020-05-25 ENCOUNTER — Other Ambulatory Visit (HOSPITAL_BASED_OUTPATIENT_CLINIC_OR_DEPARTMENT_OTHER): Payer: Self-pay

## 2020-05-25 DIAGNOSIS — G4733 Obstructive sleep apnea (adult) (pediatric): Secondary | ICD-10-CM

## 2020-06-07 ENCOUNTER — Ambulatory Visit: Payer: Medicare HMO | Attending: Neurology | Admitting: Neurology

## 2020-06-07 ENCOUNTER — Other Ambulatory Visit: Payer: Self-pay

## 2020-06-07 DIAGNOSIS — G4733 Obstructive sleep apnea (adult) (pediatric): Secondary | ICD-10-CM | POA: Insufficient documentation

## 2020-06-19 NOTE — Procedures (Signed)
  [place]and[place] A. Merlene Laughter, MD     [email or web address]             NOCTURNAL POLYSOMNOGRAPHY   LOCATION: [facility]  Patient Name: Brandon Ferrell, Brandon Ferrell Date: 06/07/2020 Gender: Male D.O.B: Aug 29, 1955 Age (years): 29 Referring Provider: Barton Fanny NP Height (inches): 71 Interpreting Physician: Phillips Odor MD, ABSM Weight (lbs): 234 RPSGT: Rosebud Poles BMI: 33 MRN: [id number] Neck Size: 18.00 CLINICAL INFORMATION Sleep Study Type: NPSG     Indication for sleep study: N/A     Epworth Sleepiness Score: 5     SLEEP STUDY TECHNIQUE As per the AASM Manual for the Scoring of Sleep and Associated Events v2.3 (April 2016) with a hypopnea requiring 4% desaturations.  The channels recorded and monitored were frontal, central and occipital EEG, electrooculogram (EOG), submentalis EMG (chin), nasal and oral airflow, thoracic and abdominal wall motion, anterior tibialis EMG, snore microphone, electrocardiogram, and pulse oximetry.  MEDICATIONS Medications self-administered by patient taken the night of the study : N/A     SLEEP ARCHITECTURE The study was initiated at 10:18:43 PM and ended at 5:05:55 AM.  Sleep onset time was 56.3 minutes and the sleep efficiency was 83.1%. The total sleep time was 338.5 minutes.  Stage REM latency was 65.0 minutes.  The patient spent 3.55% of the night in stage N1 sleep, 54.65% in stage N2 sleep, 23.93% in stage N3 and 17.9% in REM.  Alpha intrusion was absent.  Supine sleep was 0.00%.  RESPIRATORY PARAMETERS The overall apnea/hypopnea index (AHI) was 10 per hour. There were 2 total apneas, including 2 obstructive, 0 central and 0 mixed apneas. There were 54 hypopneas and 0 RERAs.  The AHI during Stage REM sleep was 11.9 per hour.  AHI while supine was N/A per hour.  The mean oxygen saturation was 92.35%. The minimum SpO2 during sleep was 84.00%.  loud snoring was noted during this study.  CARDIAC  DATA The 2 lead EKG demonstrated sinus rhythm. The mean heart rate was 67.38 beats per minute. Other EKG findings include: PVCs.  LEG MOVEMENT DATA The total PLMS were 0 with a resulting PLMS index of 0.00. Associated arousal with leg movement index was 0.0.  IMPRESSIONS Mild obstructive sleep apnea is noted with this recording. Consider trial of auto PAP 8-14.   Delano Metz, MD Diplomate, American Board of Sleep Medicine. ELECTRONICALLY SIGNED ON:  06/19/2020, 4:26 PM [place] PH: [phone]   FX: [phone] [place]

## 2020-08-06 ENCOUNTER — Encounter (HOSPITAL_BASED_OUTPATIENT_CLINIC_OR_DEPARTMENT_OTHER): Payer: Self-pay | Admitting: Orthopedic Surgery

## 2020-08-06 ENCOUNTER — Other Ambulatory Visit: Payer: Self-pay

## 2020-08-10 ENCOUNTER — Other Ambulatory Visit (HOSPITAL_COMMUNITY)
Admission: RE | Admit: 2020-08-10 | Discharge: 2020-08-10 | Disposition: A | Discharge status: Home / Self Care | Payer: Medicare Other | Source: Ambulatory Visit | Attending: Orthopedic Surgery | Admitting: Orthopedic Surgery

## 2020-08-10 DIAGNOSIS — Z20822 Contact with and (suspected) exposure to covid-19: Secondary | ICD-10-CM | POA: Diagnosis not present

## 2020-08-10 DIAGNOSIS — Z01812 Encounter for preprocedural laboratory examination: Secondary | ICD-10-CM | POA: Insufficient documentation

## 2020-08-11 LAB — SARS CORONAVIRUS 2 (TAT 6-24 HRS): SARS Coronavirus 2: NEGATIVE

## 2020-08-12 NOTE — Anesthesia Preprocedure Evaluation (Addendum)
Anesthesia Evaluation  Patient identified by MRN, date of birth, ID band Patient awake    Reviewed: Allergy & Precautions, NPO status , Patient's Chart, lab work & pertinent test results  Airway Mallampati: II  TM Distance: >3 FB Neck ROM: Full    Dental no notable dental hx. (+) Edentulous Lower, Edentulous Upper   Pulmonary asthma , COPD, Current Smoker,    Pulmonary exam normal breath sounds clear to auscultation       Cardiovascular hypertension, Pt. on medications Normal cardiovascular exam Rhythm:Regular Rate:Normal     Neuro/Psych negative psych ROS   GI/Hepatic Neg liver ROS, GERD  ,  Endo/Other  negative endocrine ROS  Renal/GU negative Renal ROS     Musculoskeletal  (+) Arthritis ,   Abdominal (+) + obese,   Peds  Hematology   Anesthesia Other Findings   Reproductive/Obstetrics                            Anesthesia Physical Anesthesia Plan  ASA: III  Anesthesia Plan: General   Post-op Pain Management:  Regional for Post-op pain   Induction: Intravenous  PONV Risk Score and Plan: Treatment may vary due to age or medical condition, Midazolam, Dexamethasone and Ondansetron  Airway Management Planned: Oral ETT  Additional Equipment: None  Intra-op Plan:   Post-operative Plan: Extubation in OR  Informed Consent: I have reviewed the patients History and Physical, chart, labs and discussed the procedure including the risks, benefits and alternatives for the proposed anesthesia with the patient or authorized representative who has indicated his/her understanding and acceptance.     Dental advisory given  Plan Discussed with: CRNA and Anesthesiologist  Anesthesia Plan Comments: (GA w L ISB)       Anesthesia Quick Evaluation

## 2020-08-13 ENCOUNTER — Ambulatory Visit (HOSPITAL_BASED_OUTPATIENT_CLINIC_OR_DEPARTMENT_OTHER)
Admission: RE | Admit: 2020-08-13 | Discharge: 2020-08-13 | Disposition: A | Discharge status: Home / Self Care | Payer: Medicare Other | Attending: Orthopedic Surgery | Admitting: Orthopedic Surgery

## 2020-08-13 ENCOUNTER — Ambulatory Visit (HOSPITAL_BASED_OUTPATIENT_CLINIC_OR_DEPARTMENT_OTHER): Payer: Medicare Other | Admitting: Anesthesiology

## 2020-08-13 ENCOUNTER — Encounter (HOSPITAL_BASED_OUTPATIENT_CLINIC_OR_DEPARTMENT_OTHER)
Admission: RE | Disposition: A | Discharge status: Home / Self Care | Payer: Self-pay | Source: Home / Self Care | Attending: Orthopedic Surgery

## 2020-08-13 ENCOUNTER — Encounter (HOSPITAL_BASED_OUTPATIENT_CLINIC_OR_DEPARTMENT_OTHER): Payer: Self-pay | Admitting: Orthopedic Surgery

## 2020-08-13 ENCOUNTER — Other Ambulatory Visit: Payer: Self-pay

## 2020-08-13 DIAGNOSIS — Z79899 Other long term (current) drug therapy: Secondary | ICD-10-CM | POA: Diagnosis not present

## 2020-08-13 DIAGNOSIS — X58XXXA Exposure to other specified factors, initial encounter: Secondary | ICD-10-CM | POA: Insufficient documentation

## 2020-08-13 DIAGNOSIS — Z91030 Bee allergy status: Secondary | ICD-10-CM | POA: Insufficient documentation

## 2020-08-13 DIAGNOSIS — F17290 Nicotine dependence, other tobacco product, uncomplicated: Secondary | ICD-10-CM | POA: Insufficient documentation

## 2020-08-13 DIAGNOSIS — Z91013 Allergy to seafood: Secondary | ICD-10-CM | POA: Insufficient documentation

## 2020-08-13 DIAGNOSIS — K219 Gastro-esophageal reflux disease without esophagitis: Secondary | ICD-10-CM | POA: Diagnosis not present

## 2020-08-13 DIAGNOSIS — Z7951 Long term (current) use of inhaled steroids: Secondary | ICD-10-CM | POA: Diagnosis not present

## 2020-08-13 DIAGNOSIS — J449 Chronic obstructive pulmonary disease, unspecified: Secondary | ICD-10-CM | POA: Diagnosis not present

## 2020-08-13 DIAGNOSIS — S46212A Strain of muscle, fascia and tendon of other parts of biceps, left arm, initial encounter: Secondary | ICD-10-CM | POA: Diagnosis present

## 2020-08-13 DIAGNOSIS — Y939 Activity, unspecified: Secondary | ICD-10-CM | POA: Insufficient documentation

## 2020-08-13 DIAGNOSIS — I10 Essential (primary) hypertension: Secondary | ICD-10-CM | POA: Diagnosis not present

## 2020-08-13 HISTORY — PX: BICEPT TENODESIS: SHX5116

## 2020-08-13 SURGERY — TENODESIS, BICEPS
Anesthesia: General | Site: Shoulder | Laterality: Left

## 2020-08-13 MED ORDER — DEXMEDETOMIDINE (PRECEDEX) IN NS 20 MCG/5ML (4 MCG/ML) IV SYRINGE
PREFILLED_SYRINGE | INTRAVENOUS | Status: DC | PRN
  Administered 2020-08-13: 12 ug via INTRAVENOUS

## 2020-08-13 MED ORDER — ONDANSETRON HCL 4 MG/2ML IJ SOLN
INTRAMUSCULAR | Status: DC | PRN
  Administered 2020-08-13: 4 mg via INTRAVENOUS

## 2020-08-13 MED ORDER — OXYCODONE HCL 5 MG/5ML PO SOLN: 5.0000 mg | Freq: Once | ORAL | Status: DC | PRN

## 2020-08-13 MED ORDER — ROCURONIUM BROMIDE 100 MG/10ML IV SOLN
INTRAVENOUS | Status: DC | PRN
  Administered 2020-08-13: 50 mg via INTRAVENOUS

## 2020-08-13 MED ORDER — CEFAZOLIN SODIUM-DEXTROSE 2-4 GM/100ML-% IV SOLN
INTRAVENOUS | Status: AC
  Filled 2020-08-13: qty 100

## 2020-08-13 MED ORDER — PHENYLEPHRINE 40 MCG/ML (10ML) SYRINGE FOR IV PUSH (FOR BLOOD PRESSURE SUPPORT)
PREFILLED_SYRINGE | INTRAVENOUS | Status: AC
  Filled 2020-08-13: qty 10

## 2020-08-13 MED ORDER — FENTANYL CITRATE (PF) 100 MCG/2ML IJ SOLN
INTRAMUSCULAR | Status: AC
  Filled 2020-08-13: qty 2

## 2020-08-13 MED ORDER — FENTANYL CITRATE (PF) 100 MCG/2ML IJ SOLN
100.0000 ug | Freq: Once | INTRAMUSCULAR | Status: AC
  Administered 2020-08-13: 100 ug via INTRAVENOUS

## 2020-08-13 MED ORDER — LACTATED RINGERS IV SOLN: INTRAVENOUS | Status: DC

## 2020-08-13 MED ORDER — ONDANSETRON HCL 4 MG/2ML IJ SOLN: 4.0000 mg | Freq: Once | INTRAMUSCULAR | Status: DC | PRN

## 2020-08-13 MED ORDER — KETOROLAC TROMETHAMINE 30 MG/ML IJ SOLN
INTRAMUSCULAR | Status: AC
  Filled 2020-08-13: qty 1

## 2020-08-13 MED ORDER — EPINEPHRINE PF 1 MG/ML IJ SOLN
INTRAMUSCULAR | Status: AC
  Filled 2020-08-13: qty 2

## 2020-08-13 MED ORDER — ACETAMINOPHEN 500 MG PO TABS
1000.0000 mg | ORAL_TABLET | Freq: Once | ORAL | Status: AC
  Administered 2020-08-13: 1000 mg via ORAL

## 2020-08-13 MED ORDER — BUPIVACAINE HCL (PF) 0.25 % IJ SOLN
INTRAMUSCULAR | Status: AC
  Filled 2020-08-13: qty 60

## 2020-08-13 MED ORDER — KETOROLAC TROMETHAMINE 30 MG/ML IJ SOLN
INTRAMUSCULAR | Status: DC | PRN
  Administered 2020-08-13: 30 mg via INTRAVENOUS

## 2020-08-13 MED ORDER — LIDOCAINE HCL (CARDIAC) PF 100 MG/5ML IV SOSY
PREFILLED_SYRINGE | INTRAVENOUS | Status: DC | PRN
  Administered 2020-08-13: 100 mg via INTRAVENOUS

## 2020-08-13 MED ORDER — SUGAMMADEX SODIUM 200 MG/2ML IV SOLN
INTRAVENOUS | Status: DC | PRN
  Administered 2020-08-13: 200 mg via INTRAVENOUS

## 2020-08-13 MED ORDER — OXYCODONE HCL 5 MG PO TABS: 5.0000 mg | ORAL_TABLET | Freq: Once | ORAL | Status: DC | PRN

## 2020-08-13 MED ORDER — CEFAZOLIN SODIUM-DEXTROSE 2-4 GM/100ML-% IV SOLN
2.0000 g | INTRAVENOUS | Status: AC
  Administered 2020-08-13: 2 g via INTRAVENOUS

## 2020-08-13 MED ORDER — MIDAZOLAM HCL 2 MG/2ML IJ SOLN
INTRAMUSCULAR | Status: AC
  Filled 2020-08-13: qty 2

## 2020-08-13 MED ORDER — VANCOMYCIN HCL 1000 MG IV SOLR
INTRAVENOUS | Status: DC | PRN
Start: 2020-08-13 — End: 2020-08-13
  Administered 2020-08-13: 1000 mg

## 2020-08-13 MED ORDER — ONDANSETRON HCL 4 MG/2ML IJ SOLN
INTRAMUSCULAR | Status: AC
  Filled 2020-08-13: qty 2

## 2020-08-13 MED ORDER — ACETAMINOPHEN 500 MG PO TABS
ORAL_TABLET | ORAL | Status: AC
  Filled 2020-08-13: qty 2

## 2020-08-13 MED ORDER — ROPIVACAINE HCL 5 MG/ML IJ SOLN
INTRAMUSCULAR | Status: DC | PRN
  Administered 2020-08-13: 150 mg via PERINEURAL

## 2020-08-13 MED ORDER — DROPERIDOL 2.5 MG/ML IJ SOLN
0.6250 mg | Freq: Once | INTRAMUSCULAR | Status: DC | PRN
Start: 2020-08-13 — End: 2020-08-13

## 2020-08-13 MED ORDER — HYDROMORPHONE HCL 1 MG/ML IJ SOLN: 0.2500 mg | INTRAMUSCULAR | Status: DC | PRN

## 2020-08-13 MED ORDER — DEXAMETHASONE SODIUM PHOSPHATE 10 MG/ML IJ SOLN
INTRAMUSCULAR | Status: AC
  Filled 2020-08-13: qty 1

## 2020-08-13 MED ORDER — MIDAZOLAM HCL 2 MG/2ML IJ SOLN
2.0000 mg | Freq: Once | INTRAMUSCULAR | Status: AC
  Administered 2020-08-13: 2 mg via INTRAVENOUS

## 2020-08-13 MED ORDER — PROPOFOL 10 MG/ML IV BOLUS
INTRAVENOUS | Status: DC | PRN
  Administered 2020-08-13: 110 mg via INTRAVENOUS

## 2020-08-13 MED ORDER — ACETAMINOPHEN 10 MG/ML IV SOLN: 1000.0000 mg | Freq: Once | INTRAVENOUS | Status: DC | PRN

## 2020-08-13 MED ORDER — POVIDONE-IODINE 10 % EX SWAB: 2.0000 "application " | Freq: Once | CUTANEOUS | Status: DC

## 2020-08-13 MED ORDER — EPHEDRINE SULFATE 50 MG/ML IJ SOLN
INTRAMUSCULAR | Status: DC | PRN
  Administered 2020-08-13: 10 mg via INTRAVENOUS

## 2020-08-13 MED ORDER — PHENYLEPHRINE HCL (PRESSORS) 10 MG/ML IV SOLN
INTRAVENOUS | Status: DC | PRN
  Administered 2020-08-13 (×4): 80 ug via INTRAVENOUS

## 2020-08-13 MED ORDER — CLONIDINE HCL (ANALGESIA) 100 MCG/ML EP SOLN
EPIDURAL | Status: DC | PRN
  Administered 2020-08-13: 100 ug

## 2020-08-13 MED ORDER — DEXAMETHASONE SODIUM PHOSPHATE 4 MG/ML IJ SOLN
INTRAMUSCULAR | Status: DC | PRN
  Administered 2020-08-13: 10 mg via INTRAVENOUS

## 2020-08-13 MED ORDER — VANCOMYCIN HCL 1000 MG IV SOLR
INTRAVENOUS | Status: AC
  Filled 2020-08-13: qty 2000

## 2020-08-13 MED ORDER — FENTANYL CITRATE (PF) 100 MCG/2ML IJ SOLN
INTRAMUSCULAR | Status: DC | PRN
  Administered 2020-08-13 (×2): 50 ug via INTRAVENOUS

## 2020-08-13 MED ORDER — PROPOFOL 10 MG/ML IV BOLUS
INTRAVENOUS | Status: AC
  Filled 2020-08-13: qty 40

## 2020-08-13 MED ORDER — HYDROMORPHONE HCL 2 MG PO TABS: 2.0000 mg | ORAL_TABLET | Freq: Four times a day (QID) | ORAL | 0 refills | Status: DC | PRN

## 2020-08-13 SURGICAL SUPPLY — 61 items
ANCHOR SUT 1.8 FBRTK KNTLS 2SU (Anchor) ×1 IMPLANT
BLADE SURG 10 STRL SS (BLADE) ×1 IMPLANT
BLADE SURG 15 STRL LF DISP TIS (BLADE) ×2 IMPLANT
BLADE SURG 15 STRL SS (BLADE) ×4
CLSR STERI-STRIP ANTIMIC 1/2X4 (GAUZE/BANDAGES/DRESSINGS) ×2 IMPLANT
COVER WAND RF STERILE (DRAPES) IMPLANT
DECANTER SPIKE VIAL GLASS SM (MISCELLANEOUS) IMPLANT
DRAPE INCISE IOBAN 66X45 STRL (DRAPES) ×2 IMPLANT
DRAPE U-SHAPE 47X51 STRL (DRAPES) ×2 IMPLANT
DRAPE U-SHAPE 76X120 STRL (DRAPES) ×4 IMPLANT
DRSG AQUACEL AG ADV 3.5X 6 (GAUZE/BANDAGES/DRESSINGS) ×1 IMPLANT
DRSG PAD ABDOMINAL 8X10 ST (GAUZE/BANDAGES/DRESSINGS) ×1 IMPLANT
DURAPREP 26ML APPLICATOR (WOUND CARE) ×2 IMPLANT
ELECT BLADE 6.5 EXT (BLADE) IMPLANT
ELECT REM PT RETURN 9FT ADLT (ELECTROSURGICAL) ×2
ELECTRODE REM PT RTRN 9FT ADLT (ELECTROSURGICAL) ×1 IMPLANT
GAUZE 4X4 16PLY RFD (DISPOSABLE) IMPLANT
GAUZE SPONGE 4X4 12PLY STRL (GAUZE/BANDAGES/DRESSINGS) ×2 IMPLANT
GLOVE SRG 8 PF TXTR STRL LF DI (GLOVE) ×2 IMPLANT
GLOVE SURG ENC MOIS LTX SZ6.5 (GLOVE) ×1 IMPLANT
GLOVE SURG ENC MOIS LTX SZ7 (GLOVE) ×2 IMPLANT
GLOVE SURG LTX SZ8 (GLOVE) ×1 IMPLANT
GLOVE SURG ORTHO LTX SZ7.5 (GLOVE) ×2 IMPLANT
GLOVE SURG UNDER POLY LF SZ7 (GLOVE) ×4 IMPLANT
GLOVE SURG UNDER POLY LF SZ8 (GLOVE) ×4
GOWN STRL REUS W/ TWL LRG LVL3 (GOWN DISPOSABLE) ×2 IMPLANT
GOWN STRL REUS W/ TWL XL LVL3 (GOWN DISPOSABLE) ×2 IMPLANT
GOWN STRL REUS W/TWL LRG LVL3 (GOWN DISPOSABLE) ×4
GOWN STRL REUS W/TWL XL LVL3 (GOWN DISPOSABLE) ×5 IMPLANT
KIT LEG STABILIZATION (KITS) ×1 IMPLANT
NS IRRIG 1000ML POUR BTL (IV SOLUTION) ×1 IMPLANT
PACK ARTHROSCOPY DSU (CUSTOM PROCEDURE TRAY) ×2 IMPLANT
PACK BASIN DAY SURGERY FS (CUSTOM PROCEDURE TRAY) ×2 IMPLANT
PASSER SUT SWANSON 36MM LOOP (INSTRUMENTS) IMPLANT
PENCIL SMOKE EVACUATOR (MISCELLANEOUS) ×2 IMPLANT
SLEEVE SCD COMPRESS KNEE MED (STOCKING) ×2 IMPLANT
SLING ARM FOAM STRAP LRG (SOFTGOODS) IMPLANT
SLING ARM FOAM STRAP XLG (SOFTGOODS) ×1 IMPLANT
SPONGE LAP 4X18 RFD (DISPOSABLE) ×2 IMPLANT
SUCTION FRAZIER HANDLE 10FR (MISCELLANEOUS)
SUCTION TUBE FRAZIER 10FR DISP (MISCELLANEOUS) ×1 IMPLANT
SUT FIBERWIRE #2 38 T-5 BLUE (SUTURE) ×2
SUT MNCRL AB 3-0 PS2 18 (SUTURE) IMPLANT
SUT MNCRL AB 4-0 PS2 18 (SUTURE) IMPLANT
SUT PDS AB 0 CT 36 (SUTURE) IMPLANT
SUT VIC AB 0 CT1 18XCR BRD 8 (SUTURE) IMPLANT
SUT VIC AB 0 CT1 36 (SUTURE) ×1 IMPLANT
SUT VIC AB 0 CT1 8-18 (SUTURE)
SUT VIC AB 2-0 SH 18 (SUTURE) IMPLANT
SUT VIC AB 2-0 SH 27 (SUTURE) ×2
SUT VIC AB 2-0 SH 27XBRD (SUTURE) IMPLANT
SUT VIC AB 3-0 SH 27 (SUTURE) ×2
SUT VIC AB 3-0 SH 27X BRD (SUTURE) IMPLANT
SUT VICRYL 3-0 CR8 SH (SUTURE) ×2 IMPLANT
SUTURE FIBERWR #2 38 T-5 BLUE (SUTURE) IMPLANT
SYR BULB EAR ULCER 3OZ GRN STR (SYRINGE) ×2 IMPLANT
SYS FBRTK BUTTON 2.6 (Anchor) ×2 IMPLANT
SYSTEM FBRTK BUTTON 2.6 (Anchor) IMPLANT
TOWEL GREEN STERILE FF (TOWEL DISPOSABLE) ×3 IMPLANT
TUBE SUCTION HIGH CAP CLEAR NV (SUCTIONS) ×1 IMPLANT
YANKAUER SUCT BULB TIP NO VENT (SUCTIONS) ×1 IMPLANT

## 2020-08-13 NOTE — Op Note (Signed)
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  Patient had obvious deformity preoperatively and we able to identify reasonable tendon which had scarred to the pectoralis.  We amputated this portion of the tendon and had plenty for repair.  We were able to whipstitch from the musculotendinous junction to the tip of the tendon and recreate the normal anatomy.  We used 2 anchors to create a robust repair.  2 surgeons were required to the complex nature of this case in the setting of chronic rupture and the proximity to the neurovascular bundle.  Physician assistant was present in observation only.  Post-operative plan: The patient will be nonweightbearing in a sling for 4 to 6 weeks.  The patient will be discharged home.  DVT prophylaxis not indicated in this ambulatory upper extremity patient without significant risk factors.   Pain control with PRN pain medication preferring oral medicines.  Follow up plan will be scheduled in approximately 7-10 days for incision check.  Post-Op Diagnosis: Same Surgeons:Primary: Marchia Bond, MD Assisting: Hiram Gash, MD Assistants:Caroline McBane PA-C Location: [facility] OR ROOM 6 Anesthesia: General with regional anesthesia Antibiotics: Ancef 2 g with local vancomycin powder 1 g at the surgical site Tourniquet time: * No tourniquets in log * Estimated Blood Loss: Minimal Complications: None Specimens: None Implants: Implant Name Type Inv. Item Serial No. Manufacturer Lot No. LRB No. Used Action  IMPLANT Tobias - [id number] Anchor IMPLANT [place] [id number] Left 1 Implanted  ANCHOR SUT 1.8 FIBERTAK 2 SUT - [id number] Anchor ANCHOR SUT 1.8 FIBERTAK 2 SUT  [employer]  [id number] Left 1 Implanted    Indications for Surgery:   Brandon Ferrell is a 65 y.o. male with previous total arthroplasty done by Dr. Mardelle Matte with a proximal biceps rupture afterwards.  Even after a year of conservative treatment he continued to have cramping and pain and was unhappy with the contour of his biceps.  I was enlisted to help with the case due to the nature of this chronic rupture.  Family understood that there is a chance that we would not be able to repair the biceps.  Benefits and risks of operative and nonoperative management were discussed prior to surgery with patient/guardian(s) and informed consent form was completed.  Specific risks including infection, need for additional surgery, loss of failure, neurovascular damage, periprosthetic infection amongst others   Procedure:   The patient was identified properly. Informed consent was obtained and the surgical site was marked. The patient was taken up to suite where general anesthesia was induced.  The patient was positioned beachchair on [employer] table.  The left arm was prepped and draped in the usual sterile fashion.  Timeout was performed before the beginning of the case.  Patient in obvious defect and we were able to identify the inferior border the pectoralis.  Made a 4 cm incision overlying the palpable tendon of the biceps.  We then carefully dissected down and identified the bicipital fascia open this and carefully dissected around the neurovascular bundle.  We at that point were able to identify the long head of the biceps which was palpated as it moved proximally.  We are able to identify that was scarred to the pectoralis.  We released adhesions but still unable  to manually pull this away and instead used retractors to protect ourselves and amputated the tendon leaving ourselves enough tendon to recreate the normal contour of the biceps.  We then placed a Hohmann retractor laterally and blunt retractor medially were able to  identify the bicipital groove remnant.  At this point we used a Bovie electrocautery to prepare the bony surface for healing clearing soft tissue.  We then placed a Arthrex 2.6 fiber tack button as well as a 1.8 fiber tack knotless for fixation.  We whipstitch the tendon to the muscular tendinous junction with a fiber loop obtaining good purchase of the tendon.  We then shuttled the sutures into our fiber tack button and passed a limb of the suture back through the tendon to obtain fixation.  That point we passed a limb of the fiber tack knotless through the tendon without overtensioning and were able to get another passive fixation.  We had a robust construct in the case.  We irrigated the wound copiously before placing local antibiotic as listed above.  We closed the incision in a multilayer fashion with absorbable suture.  Sterile dressing was placed.  Sling placed.  Patient was awoken taken to PACU in stable condition.  [name], PA-C, present and scrubbed throughout the case, critical for completion in a timely fashion, and for retraction, instrumentation, closure.

## 2020-08-13 NOTE — Progress Notes (Signed)
Assisted Dr. Valma Cava with left, ultrasound guided, interscalene  block. Side rails up, monitors on throughout procedure. See vital signs in flow sheet. Tolerated Procedure well.

## 2020-08-13 NOTE — Anesthesia Postprocedure Evaluation (Signed)
Anesthesia Post Note  Patient: Brandon Ferrell  Procedure(s) Performed: OPEN BICEPS TENODESIS (Left Shoulder)     Patient location during evaluation: PACU Anesthesia Type: General Level of consciousness: awake and alert Pain management: pain level controlled Vital Signs Assessment: post-procedure vital signs reviewed and stable Respiratory status: spontaneous breathing, nonlabored ventilation, respiratory function stable and patient connected to nasal cannula oxygen Cardiovascular status: blood pressure returned to baseline and stable Postop Assessment: no apparent nausea or vomiting Anesthetic complications: no   No complications documented.  Last Vitals:  Vitals:   08/13/20 0915 08/13/20 0950  BP: (!) 150/86 (!) 145/88  Pulse: 77 75  Resp: 20 20  Temp:  36.8 C  SpO2: 93% 98%    Last Pain:  Vitals:   08/13/20 0950  TempSrc: Oral  PainSc: 0-No pain                 Barnet Glasgow

## 2020-08-13 NOTE — Discharge Instructions (Signed)
Diet: As you were doing prior to hospitalization   Shower:  May shower but keep the wounds dry, use an occlusive plastic wrap, NO SOAKING IN TUB. You bandage is waterproof as long as it has a good seal.    Dressing:  You may change your dressing 3-5 days after surgery, unless you have a splint.  If you have a splint, then just leave the splint in place and we will change your bandages during your first follow-up appointment.    If you had hand or foot surgery, we will plan to remove your stitches in about 2 weeks in the office.  For all other surgeries, there are sticky tapes (steri-strips) on your wounds and all the stitches are absorbable.  Leave the steri-strips in place when changing your dressings, they will peel off with time, usually 2-3 weeks.  Activity:  Increase activity slowly as tolerated, but follow the weight bearing instructions below.  The rules on driving is that you can not be taking narcotics while you drive, and you must feel in control of the vehicle.    Weight Bearing:   Keep arm in sling until follow up, no weight bearing with left arm.   To prevent constipation: you may use a stool softener such as -  Colace (over the counter) 100 mg by mouth twice a day  Drink plenty of fluids (prune juice may be helpful) and high fiber foods Miralax (over the counter) for constipation as needed.    Itching:  If you experience itching with your medications, try taking only a single pain pill, or even half a pain pill at a time.  You may take up to 10 pain pills per day, and you can also use benadryl over the counter for itching or also to help with sleep.   Precautions:  If you experience chest pain or shortness of breath - call 911 immediately for transfer to the hospital emergency department!!  If you develop a fever greater that 101 F, purulent drainage from wound, increased redness or drainage from wound, or calf pain -- Call the office at [phone]                                                 Follow- Up Appointment:  Please call for an appointment to be seen in 2 weeks [place] - [phone]   No tylenol/ibuprofen until 2pm   Post Anesthesia Home Care Instructions  Activity: Get plenty of rest for the remainder of the day. A responsible individual must stay with you for 24 hours following the procedure.  For the next 24 hours, DO NOT: -Drive a car -Paediatric nurse -Drink alcoholic beverages -Take any medication unless instructed by your physician -Make any legal decisions or sign important papers.  Meals: Start with liquid foods such as gelatin or soup. Progress to regular foods as tolerated. Avoid greasy, spicy, heavy foods. If nausea and/or vomiting occur, drink only clear liquids until the nausea and/or vomiting subsides. Call your physician if vomiting continues.  Special Instructions/Symptoms: Your throat may feel dry or sore from the anesthesia or the breathing tube placed in your throat during surgery. If this causes discomfort, gargle with warm salt water. The discomfort should disappear within 24 hours.  If you had a scopolamine patch placed behind your ear for the management of post- operative nausea and/or vomiting:  1. The medication in the patch is effective for 72 hours, after which it should be removed.  Wrap patch in a tissue and discard in the trash. Wash hands thoroughly with soap and water. 2. You may remove the patch earlier than 72 hours if you experience unpleasant side effects which may include dry mouth, dizziness or visual disturbances. 3. Avoid touching the patch. Wash your hands with soap and water after contact with the patch.    Regional Anesthesia Blocks  1. Numbness or the inability to move the "blocked" extremity may last from 3-48 hours after placement. The length of time depends on the medication injected and your individual response to the medication. If the numbness is not going away after 48 hours, call your  surgeon.  2. The extremity that is blocked will need to be protected until the numbness is gone and the  Strength has returned. Because you cannot feel it, you will need to take extra care to avoid injury. Because it may be weak, you may have difficulty moving it or using it. You may not know what position it is in without looking at it while the block is in effect.  3. For blocks in the legs and feet, returning to weight bearing and walking needs to be done carefully. You will need to wait until the numbness is entirely gone and the strength has returned. You should be able to move your leg and foot normally before you try and bear weight or walk. You will need someone to be with you when you first try to ensure you do not fall and possibly risk injury.  4. Bruising and tenderness at the needle site are common side effects and will resolve in a few days.  5. Persistent numbness or new problems with movement should be communicated to the surgeon or the [facility] [phone] [place] [phone]).

## 2020-08-13 NOTE — Transfer of Care (Signed)
Immediate Anesthesia Transfer of Care Note  Patient: Brandon Ferrell  Procedure(s) Performed: OPEN BICEPS TENODESIS (Left Shoulder)  Patient Location: PACU  Anesthesia Type:General and Regional  Level of Consciousness: drowsy  Airway & Oxygen Therapy: Patient Spontanous Breathing and Patient connected to face mask oxygen  Post-op Assessment: Report given to RN and Post -op Vital signs reviewed and stable  Post vital signs: Reviewed and stable  Last Vitals:  Vitals Value Taken Time  BP    Temp    Pulse 68 08/13/20 0842  Resp 24 08/13/20 0842  SpO2 92 % 08/13/20 0842  Vitals shown include unvalidated device data.  Last Pain:  Vitals:   08/13/20 0650  TempSrc: Oral  PainSc: 4       Patients Stated Pain Goal: 3 (59/29/24 4628)  Complications: No complications documented.

## 2020-08-13 NOTE — Interval H&P Note (Signed)
History and Physical Interval Note:  08/13/2020 7:10 AM  Brandon Ferrell  has presented today for surgery, with the diagnosis of biceps rupture after total shoulder replacement.  The various methods of treatment have been discussed with the patient and family. After consideration of risks, benefits and other options for treatment, the patient has consented to  Procedure(s): BICEPS TENODESIS (Left) as a surgical intervention.  The patient's history has been reviewed, patient examined, no change in status, stable for surgery.  I have reviewed the patient's chart and labs.  Questions were answered to the patient's satisfaction.  We have explicitly discussed the risks that we may not be able to find or repair the tenodesis rupture, and there are significant neurovascular risks.  I have requested the help of Dr. Griffin Basil as well, as a second fellowship trained shoulder surgeon to help try to achieve a very difficult and unlikely successful outcome, and have counseled the patient at length as such.     [name]

## 2020-08-13 NOTE — Anesthesia Procedure Notes (Signed)
Anesthesia Regional Block: Interscalene brachial plexus block   Pre-Anesthetic Checklist: ,, timeout performed, Correct Patient, Correct Site, Correct Laterality, Correct Procedure, Correct Position, site marked, Risks and benefits discussed,  Surgical consent,  Pre-op evaluation,  At surgeon's request and post-op pain management  Laterality: Upper and Left  Prep: Maximum Sterile Barrier Precautions used, chloraprep       Needles:  Injection technique: Single-shot  Needle Type: Echogenic Needle     Needle Length: 5cm  Needle Gauge: 21     Additional Needles:   Procedures:,,,, ultrasound used (permanent image in chart),,,,  Narrative:  Start time: 08/13/2020 7:10 AM End time: 08/13/2020 7:15 AM Injection made incrementally with aspirations every 5 mL.  Performed by: Personally  Anesthesiologist: Barnet Glasgow, MD  Additional Notes: Block assessed prior to procedure. Patient tolerated procedure well.

## 2020-08-13 NOTE — Anesthesia Procedure Notes (Signed)
Procedure Name: Intubation Performed by: Ezequiel Kayser, CRNA Pre-anesthesia Checklist: Patient identified, Emergency Drugs available, Suction available and Patient being monitored Patient Re-evaluated:Patient Re-evaluated prior to induction Oxygen Delivery Method: Circle System Utilized Preoxygenation: Pre-oxygenation with 100% oxygen Induction Type: IV induction Ventilation: Mask ventilation without difficulty Laryngoscope Size: Mac and 4 Grade View: Grade III Tube type: Oral Tube size: 7.0 mm Number of attempts: 1 Airway Equipment and Method: Stylet and Oral airway Placement Confirmation: ETT inserted through vocal cords under direct vision,  positive ETCO2 and breath sounds checked- equal and bilateral Secured at: 23 cm Tube secured with: Tape Dental Injury: Teeth and Oropharynx as per pre-operative assessment

## 2020-08-13 NOTE — H&P (Addendum)
PREOPERATIVE H&P  Chief Complaint: left arm pain  HPI: Brandon Ferrell is a 65 y.o. male who presents for preoperative history and physical with a diagnosis of left proximal biceps rupture after total shoulder replacement. Symptoms are rated as severe, and have been worsening. Every time he picks up his grandchild he cramps and he cannot use it for daily activities because it cramps so much.  He rates pain as 10/10.   This is significantly impairing activities of daily living.  He has elected for surgical management.   Past Medical History:  Diagnosis Date  . Acute renal insufficiency 07/18/2015  . Acute urinary retention 07/18/2015  . Arthritis   . Asthma    as a child  . COPD (chronic obstructive pulmonary disease) ([place])   . GERD (gastroesophageal reflux disease)    occ zantac  . Hx of adenomatous colonic polyps 08/30/2017  . Hyperlipidemia   . Hypertension   . Palpitations   . Pneumonia 2017   hx  . Primary localized osteoarthrosis of left shoulder 12/04/2018   Past Surgical History:  Procedure Laterality Date  . CERVICAL FUSION    . GROIN EXPLORATION Right 07/03/15   cyst removed- local anesthesia [place]  . LUNG BIOPSY  13   pneum, dx copd  . TOTAL HIP ARTHROPLASTY Right 11/07/2014   Procedure: RIGHT  TOTAL HIP ARTHROPLASTY;  Surgeon: Earlie Server, MD;  Location: [facility];  Service: Orthopedics;  Laterality: Right;  Would like to flip if possible    . TOTAL HIP ARTHROPLASTY Left 07/17/2015   Procedure: LEFT TOTAL HIP ARTHROPLASTY;  Surgeon: Earlie Server, MD;  Location: [facility];  Service: Orthopedics;  Laterality: Left;  . TOTAL SHOULDER ARTHROPLASTY Left 12/04/2018   Procedure: TOTAL SHOULDER ARTHROPLASTY;  Surgeon: Marchia Bond, MD;  Location: [facility];  Service: Orthopedics;  Laterality: Left;  Marland Kitchen VASECTOMY  5/15   Social History   Socioeconomic History  . Marital status: Married    Spouse name: Not on file  . Number of children: Not on file  . Years of education: Not on  file  . Highest education level: Not on file  Occupational History  . Not on file  Tobacco Use  . Smoking status: Current Some Day Smoker    Packs/day: 1.00    Years: 40.00    Pack years: 40.00    Types: Cigars  . Smokeless tobacco: Never Used  . Tobacco comment: 5 cigars daily  Vaping Use  . Vaping Use: Never used  Substance and Sexual Activity  . Alcohol use: No  . Drug use: No  . Sexual activity: Not on file  Other Topics Concern  . Not on file  Social History Narrative  . Not on file   Social Determinants of Health   Financial Resource Strain: Not on file  Food Insecurity: Not on file  Transportation Needs: Not on file  Physical Activity: Not on file  Stress: Not on file  Social Connections: Not on file   Family History  Problem Relation Age of Onset  . Colon cancer Maternal Grandfather   . Stomach cancer Father   . Colon polyps Neg Hx   . Esophageal cancer Neg Hx   . Rectal cancer Neg Hx    Allergies  Allergen Reactions  . Bee Venom Anaphylaxis  . Shellfish Allergy Anaphylaxis and Hives    Lobester, Spider Bites   Prior to Admission medications   Medication Sig Start Date End Date Taking? Authorizing Provider  albuterol (PROVENTIL HFA;VENTOLIN  HFA) 108 (90 BASE) MCG/ACT inhaler Inhale 2 puffs into the lungs every 6 (six) hours as needed for wheezing or shortness of breath.   Yes [provider]  amLODipine (NORVASC) 5 MG tablet Take 5 mg by mouth daily.   Yes [provider]  Fluticasone-Umeclidin-Vilant (TRELEGY ELLIPTA) 200-62.5-25 MCG/INH AEPB Inhale 1 Inhaler into the lungs in the morning.   Yes [provider]  gabapentin (NEURONTIN) 300 MG capsule Take 300 mg by mouth at bedtime.  08/15/17  Yes [provider]  omeprazole (PRILOSEC) 20 MG capsule Take 20 mg by mouth daily.   Yes [provider]  Oxycodone HCl 10 MG TABS Take 1 tablet by mouth as needed. PJ'A by Dr. Frances Furbish 03/06/19  Yes [provider]  rosuvastatin (CRESTOR) 20 MG tablet Take 20 mg by mouth daily.  07/24/17  Yes [provider]  sennosides-docusate sodium (SENOKOT-S) 8.6-50 MG tablet Take 2 tablets by mouth daily. 12/04/18  Yes Marchia Bond, MD  baclofen (LIORESAL) 10 MG tablet Take 1 tablet (10 mg total) by mouth 3 (three) times daily. As needed for muscle spasm 12/04/18   Marchia Bond, MD  ondansetron (ZOFRAN) 4 MG tablet Take 1 tablet (4 mg total) by mouth every 8 (eight) hours as needed for nausea or vomiting. 12/04/18   Marchia Bond, MD     Positive ROS: All other systems have been reviewed and were otherwise negative with the exception of those mentioned in the HPI and as above.  Physical Exam: General: Alert, no acute distress Cardiovascular: No pedal edema Respiratory: No cyanosis, no use of accessory musculature GI: No organomegaly, abdomen is soft and non-tender Skin: No lesions in the area of chief complaint Neurologic: Sensation intact distally Psychiatric: Patient is competent for consent with normal mood and affect Lymphatic: No axillary or cervical lymphadenopathy  MUSCULOSKELETAL: Left arm has well-healed surgical wound.  0-165 degrees of forward flexion.  Positive Popeye sign.  Assessment: Left proximal biceps rupture after total shoulder replacement   Plan: Plan for Procedure(s): BICEPS TENODESIS  The risks benefits and alternatives were discussed with the patient including but not limited to the risks of nonoperative treatment, versus surgical intervention including infection, bleeding, nerve injury,  blood clots, cardiopulmonary complications, morbidity, mortality, among others, and they were willing to proceed.    Ventura Bruns, PA-C    08/13/2020 6:53 AM

## 2020-08-14 ENCOUNTER — Encounter (HOSPITAL_BASED_OUTPATIENT_CLINIC_OR_DEPARTMENT_OTHER): Payer: Self-pay | Admitting: Orthopedic Surgery

## 2020-12-03 DIAGNOSIS — I10 Essential (primary) hypertension: Secondary | ICD-10-CM | POA: Diagnosis not present

## 2020-12-03 DIAGNOSIS — G4733 Obstructive sleep apnea (adult) (pediatric): Secondary | ICD-10-CM | POA: Diagnosis not present

## 2020-12-18 DIAGNOSIS — F17210 Nicotine dependence, cigarettes, uncomplicated: Secondary | ICD-10-CM | POA: Diagnosis not present

## 2020-12-18 DIAGNOSIS — E7800 Pure hypercholesterolemia, unspecified: Secondary | ICD-10-CM | POA: Diagnosis not present

## 2020-12-18 DIAGNOSIS — R001 Bradycardia, unspecified: Secondary | ICD-10-CM | POA: Diagnosis not present

## 2020-12-18 DIAGNOSIS — Z96649 Presence of unspecified artificial hip joint: Secondary | ICD-10-CM | POA: Diagnosis not present

## 2020-12-18 DIAGNOSIS — I10 Essential (primary) hypertension: Secondary | ICD-10-CM | POA: Diagnosis not present

## 2020-12-18 DIAGNOSIS — M549 Dorsalgia, unspecified: Secondary | ICD-10-CM | POA: Diagnosis not present

## 2020-12-18 DIAGNOSIS — R918 Other nonspecific abnormal finding of lung field: Secondary | ICD-10-CM | POA: Diagnosis not present

## 2020-12-18 DIAGNOSIS — M546 Pain in thoracic spine: Secondary | ICD-10-CM | POA: Diagnosis not present

## 2020-12-18 DIAGNOSIS — I491 Atrial premature depolarization: Secondary | ICD-10-CM | POA: Diagnosis not present

## 2020-12-18 DIAGNOSIS — J439 Emphysema, unspecified: Secondary | ICD-10-CM | POA: Diagnosis not present

## 2020-12-29 DIAGNOSIS — M25562 Pain in left knee: Secondary | ICD-10-CM | POA: Diagnosis not present

## 2020-12-29 DIAGNOSIS — J040 Acute laryngitis: Secondary | ICD-10-CM | POA: Diagnosis not present

## 2020-12-29 DIAGNOSIS — Z79899 Other long term (current) drug therapy: Secondary | ICD-10-CM | POA: Diagnosis not present

## 2020-12-29 DIAGNOSIS — Z0000 Encounter for general adult medical examination without abnormal findings: Secondary | ICD-10-CM | POA: Diagnosis not present

## 2020-12-29 DIAGNOSIS — G8929 Other chronic pain: Secondary | ICD-10-CM | POA: Diagnosis not present

## 2020-12-29 DIAGNOSIS — R9431 Abnormal electrocardiogram [ECG] [EKG]: Secondary | ICD-10-CM | POA: Diagnosis not present

## 2020-12-29 DIAGNOSIS — F17210 Nicotine dependence, cigarettes, uncomplicated: Secondary | ICD-10-CM | POA: Diagnosis not present

## 2020-12-31 DIAGNOSIS — Z79899 Other long term (current) drug therapy: Secondary | ICD-10-CM | POA: Diagnosis not present

## 2021-01-02 DIAGNOSIS — G4733 Obstructive sleep apnea (adult) (pediatric): Secondary | ICD-10-CM | POA: Diagnosis not present

## 2021-01-02 DIAGNOSIS — I10 Essential (primary) hypertension: Secondary | ICD-10-CM | POA: Diagnosis not present

## 2021-01-11 DIAGNOSIS — R9431 Abnormal electrocardiogram [ECG] [EKG]: Secondary | ICD-10-CM | POA: Diagnosis not present

## 2021-01-11 DIAGNOSIS — I10 Essential (primary) hypertension: Secondary | ICD-10-CM | POA: Diagnosis not present

## 2021-01-12 DIAGNOSIS — K648 Other hemorrhoids: Secondary | ICD-10-CM | POA: Diagnosis not present

## 2021-01-12 DIAGNOSIS — K219 Gastro-esophageal reflux disease without esophagitis: Secondary | ICD-10-CM | POA: Diagnosis not present

## 2021-01-12 DIAGNOSIS — K5730 Diverticulosis of large intestine without perforation or abscess without bleeding: Secondary | ICD-10-CM | POA: Diagnosis not present

## 2021-01-12 DIAGNOSIS — D126 Benign neoplasm of colon, unspecified: Secondary | ICD-10-CM | POA: Diagnosis not present

## 2021-02-01 DIAGNOSIS — F17210 Nicotine dependence, cigarettes, uncomplicated: Secondary | ICD-10-CM | POA: Diagnosis not present

## 2021-02-01 DIAGNOSIS — J040 Acute laryngitis: Secondary | ICD-10-CM | POA: Diagnosis not present

## 2021-02-01 DIAGNOSIS — M25562 Pain in left knee: Secondary | ICD-10-CM | POA: Diagnosis not present

## 2021-02-01 DIAGNOSIS — G8929 Other chronic pain: Secondary | ICD-10-CM | POA: Diagnosis not present

## 2021-02-01 DIAGNOSIS — Z79899 Other long term (current) drug therapy: Secondary | ICD-10-CM | POA: Diagnosis not present

## 2021-02-02 DIAGNOSIS — I10 Essential (primary) hypertension: Secondary | ICD-10-CM | POA: Diagnosis not present

## 2021-02-02 DIAGNOSIS — G4733 Obstructive sleep apnea (adult) (pediatric): Secondary | ICD-10-CM | POA: Diagnosis not present

## 2021-03-02 DIAGNOSIS — F17290 Nicotine dependence, other tobacco product, uncomplicated: Secondary | ICD-10-CM | POA: Diagnosis not present

## 2021-03-02 DIAGNOSIS — J449 Chronic obstructive pulmonary disease, unspecified: Secondary | ICD-10-CM | POA: Diagnosis not present

## 2021-03-02 DIAGNOSIS — K219 Gastro-esophageal reflux disease without esophagitis: Secondary | ICD-10-CM | POA: Diagnosis not present

## 2021-03-04 DIAGNOSIS — G4733 Obstructive sleep apnea (adult) (pediatric): Secondary | ICD-10-CM | POA: Diagnosis not present

## 2021-03-04 DIAGNOSIS — I10 Essential (primary) hypertension: Secondary | ICD-10-CM | POA: Diagnosis not present

## 2021-03-06 DIAGNOSIS — M25562 Pain in left knee: Secondary | ICD-10-CM | POA: Diagnosis not present

## 2021-03-06 DIAGNOSIS — G8929 Other chronic pain: Secondary | ICD-10-CM | POA: Diagnosis not present

## 2021-03-06 DIAGNOSIS — Z79899 Other long term (current) drug therapy: Secondary | ICD-10-CM | POA: Diagnosis not present

## 2021-03-06 DIAGNOSIS — F17210 Nicotine dependence, cigarettes, uncomplicated: Secondary | ICD-10-CM | POA: Diagnosis not present

## 2021-03-06 DIAGNOSIS — G4730 Sleep apnea, unspecified: Secondary | ICD-10-CM | POA: Diagnosis not present

## 2021-03-09 DIAGNOSIS — J449 Chronic obstructive pulmonary disease, unspecified: Secondary | ICD-10-CM | POA: Diagnosis not present

## 2021-03-09 DIAGNOSIS — Z9989 Dependence on other enabling machines and devices: Secondary | ICD-10-CM | POA: Diagnosis not present

## 2021-03-09 DIAGNOSIS — G4710 Hypersomnia, unspecified: Secondary | ICD-10-CM | POA: Diagnosis not present

## 2021-03-09 DIAGNOSIS — G4733 Obstructive sleep apnea (adult) (pediatric): Secondary | ICD-10-CM | POA: Diagnosis not present

## 2021-03-09 DIAGNOSIS — R030 Elevated blood-pressure reading, without diagnosis of hypertension: Secondary | ICD-10-CM | POA: Diagnosis not present

## 2021-04-04 DIAGNOSIS — G4733 Obstructive sleep apnea (adult) (pediatric): Secondary | ICD-10-CM | POA: Diagnosis not present

## 2021-04-04 DIAGNOSIS — I10 Essential (primary) hypertension: Secondary | ICD-10-CM | POA: Diagnosis not present

## 2021-04-15 DIAGNOSIS — I10 Essential (primary) hypertension: Secondary | ICD-10-CM | POA: Diagnosis not present

## 2021-04-15 DIAGNOSIS — E7800 Pure hypercholesterolemia, unspecified: Secondary | ICD-10-CM | POA: Diagnosis not present

## 2021-04-15 DIAGNOSIS — Z79899 Other long term (current) drug therapy: Secondary | ICD-10-CM | POA: Diagnosis not present

## 2021-04-15 DIAGNOSIS — E559 Vitamin D deficiency, unspecified: Secondary | ICD-10-CM | POA: Diagnosis not present

## 2021-04-15 DIAGNOSIS — G4730 Sleep apnea, unspecified: Secondary | ICD-10-CM | POA: Diagnosis not present

## 2021-04-15 DIAGNOSIS — R5383 Other fatigue: Secondary | ICD-10-CM | POA: Diagnosis not present

## 2021-04-15 DIAGNOSIS — M129 Arthropathy, unspecified: Secondary | ICD-10-CM | POA: Diagnosis not present

## 2021-04-21 DIAGNOSIS — M25551 Pain in right hip: Secondary | ICD-10-CM | POA: Diagnosis not present

## 2021-04-21 DIAGNOSIS — F17210 Nicotine dependence, cigarettes, uncomplicated: Secondary | ICD-10-CM | POA: Diagnosis not present

## 2021-04-21 DIAGNOSIS — G4733 Obstructive sleep apnea (adult) (pediatric): Secondary | ICD-10-CM | POA: Diagnosis not present

## 2021-04-21 DIAGNOSIS — I10 Essential (primary) hypertension: Secondary | ICD-10-CM | POA: Diagnosis not present

## 2021-04-21 DIAGNOSIS — J449 Chronic obstructive pulmonary disease, unspecified: Secondary | ICD-10-CM | POA: Diagnosis not present

## 2021-04-21 DIAGNOSIS — M25552 Pain in left hip: Secondary | ICD-10-CM | POA: Diagnosis not present

## 2021-05-05 DIAGNOSIS — G4733 Obstructive sleep apnea (adult) (pediatric): Secondary | ICD-10-CM | POA: Diagnosis not present

## 2021-05-05 DIAGNOSIS — I10 Essential (primary) hypertension: Secondary | ICD-10-CM | POA: Diagnosis not present

## 2021-05-15 DIAGNOSIS — G4733 Obstructive sleep apnea (adult) (pediatric): Secondary | ICD-10-CM | POA: Diagnosis not present

## 2021-05-15 DIAGNOSIS — G4761 Periodic limb movement disorder: Secondary | ICD-10-CM | POA: Diagnosis not present

## 2021-05-15 DIAGNOSIS — R0683 Snoring: Secondary | ICD-10-CM | POA: Diagnosis not present

## 2021-05-17 DIAGNOSIS — Z79899 Other long term (current) drug therapy: Secondary | ICD-10-CM | POA: Diagnosis not present

## 2021-05-17 DIAGNOSIS — Z0000 Encounter for general adult medical examination without abnormal findings: Secondary | ICD-10-CM | POA: Diagnosis not present

## 2021-05-17 DIAGNOSIS — G8929 Other chronic pain: Secondary | ICD-10-CM | POA: Diagnosis not present

## 2021-05-17 DIAGNOSIS — F17210 Nicotine dependence, cigarettes, uncomplicated: Secondary | ICD-10-CM | POA: Diagnosis not present

## 2021-05-17 DIAGNOSIS — M25562 Pain in left knee: Secondary | ICD-10-CM | POA: Diagnosis not present

## 2021-06-01 DIAGNOSIS — G4733 Obstructive sleep apnea (adult) (pediatric): Secondary | ICD-10-CM | POA: Diagnosis not present

## 2021-06-01 DIAGNOSIS — K219 Gastro-esophageal reflux disease without esophagitis: Secondary | ICD-10-CM | POA: Diagnosis not present

## 2021-06-01 DIAGNOSIS — G478 Other sleep disorders: Secondary | ICD-10-CM | POA: Diagnosis not present

## 2021-06-01 DIAGNOSIS — F17210 Nicotine dependence, cigarettes, uncomplicated: Secondary | ICD-10-CM | POA: Diagnosis not present

## 2021-06-01 DIAGNOSIS — R030 Elevated blood-pressure reading, without diagnosis of hypertension: Secondary | ICD-10-CM | POA: Diagnosis not present

## 2021-06-07 DIAGNOSIS — G4733 Obstructive sleep apnea (adult) (pediatric): Secondary | ICD-10-CM | POA: Diagnosis not present

## 2021-06-07 DIAGNOSIS — L821 Other seborrheic keratosis: Secondary | ICD-10-CM | POA: Diagnosis not present

## 2021-06-07 DIAGNOSIS — Z79899 Other long term (current) drug therapy: Secondary | ICD-10-CM | POA: Diagnosis not present

## 2021-06-07 DIAGNOSIS — F17210 Nicotine dependence, cigarettes, uncomplicated: Secondary | ICD-10-CM | POA: Diagnosis not present

## 2021-06-07 DIAGNOSIS — M25562 Pain in left knee: Secondary | ICD-10-CM | POA: Diagnosis not present

## 2021-06-07 DIAGNOSIS — G8929 Other chronic pain: Secondary | ICD-10-CM | POA: Diagnosis not present

## 2021-06-09 DIAGNOSIS — D2372 Other benign neoplasm of skin of left lower limb, including hip: Secondary | ICD-10-CM | POA: Diagnosis not present

## 2021-06-29 DIAGNOSIS — R030 Elevated blood-pressure reading, without diagnosis of hypertension: Secondary | ICD-10-CM | POA: Diagnosis not present

## 2021-06-29 DIAGNOSIS — G478 Other sleep disorders: Secondary | ICD-10-CM | POA: Diagnosis not present

## 2021-06-29 DIAGNOSIS — K219 Gastro-esophageal reflux disease without esophagitis: Secondary | ICD-10-CM | POA: Diagnosis not present

## 2021-06-29 DIAGNOSIS — J449 Chronic obstructive pulmonary disease, unspecified: Secondary | ICD-10-CM | POA: Diagnosis not present

## 2021-06-29 DIAGNOSIS — G4733 Obstructive sleep apnea (adult) (pediatric): Secondary | ICD-10-CM | POA: Diagnosis not present

## 2021-07-07 NOTE — Progress Notes (Signed)
Surgery orders requested via Epic inbox. °

## 2021-07-12 DIAGNOSIS — M1712 Unilateral primary osteoarthritis, left knee: Secondary | ICD-10-CM | POA: Diagnosis not present

## 2021-07-13 DIAGNOSIS — L821 Other seborrheic keratosis: Secondary | ICD-10-CM | POA: Diagnosis not present

## 2021-07-13 DIAGNOSIS — R030 Elevated blood-pressure reading, without diagnosis of hypertension: Secondary | ICD-10-CM | POA: Diagnosis not present

## 2021-07-13 DIAGNOSIS — G8929 Other chronic pain: Secondary | ICD-10-CM | POA: Diagnosis not present

## 2021-07-13 DIAGNOSIS — F17210 Nicotine dependence, cigarettes, uncomplicated: Secondary | ICD-10-CM | POA: Diagnosis not present

## 2021-07-13 DIAGNOSIS — Z79899 Other long term (current) drug therapy: Secondary | ICD-10-CM | POA: Diagnosis not present

## 2021-07-13 DIAGNOSIS — M25562 Pain in left knee: Secondary | ICD-10-CM | POA: Diagnosis not present

## 2021-07-13 DIAGNOSIS — J449 Chronic obstructive pulmonary disease, unspecified: Secondary | ICD-10-CM | POA: Diagnosis not present

## 2021-07-13 NOTE — Progress Notes (Signed)
Pt. Needs orders for upcoming surgery. ?

## 2021-07-13 NOTE — Patient Instructions (Addendum)
DUE TO COVID-19 ONLY ONE VISITOR  (aged 66 and older)  IS ALLOWED TO COME WITH YOU AND STAY IN THE WAITING ROOM ONLY DURING PRE OP AND PROCEDURE.   ?**NO VISITORS ARE ALLOWED IN THE SHORT STAY AREA OR RECOVERY ROOM!!** ? ?IF YOU WILL BE ADMITTED INTO THE HOSPITAL YOU ARE ALLOWED ONLY TWO SUPPORT PEOPLE DURING VISITATION HOURS ONLY (7 AM -8PM)   ?The support person(s) must pass our screening, gel in and out, and wear a mask at all times, including in the patient?s room. ?Patients must also wear a mask when staff or their support person are in the room. ?Visitors GUEST BADGE MUST BE WORN VISIBLY  ?One adult visitor may remain with you overnight and MUST be in the room by 8 P.M. ?  ? ? Your procedure is scheduled on: 07/27/21 ? ? Report to [facility] Main Entrance ? ?  Report to Short stay at : 5:15 AM ? ? Call this number if you have problems the morning of surgery [phone] ? ? Do not eat food :After Midnight. ? ? After Midnight you may have the following liquids until : 4:30 AM DAY OF SURGERY ? ?Water ?Black Coffee (sugar ok, NO MILK/CREAM OR CREAMERS)  ?Tea (sugar ok, NO MILK/CREAM OR CREAMERS) regular and decaf                             ?Plain Jell-O (NO RED)                                           ?Fruit ices (not with fruit pulp, NO RED)                                     ?Popsicles (NO RED)                                                                  ?Juice: apple, WHITE grape, WHITE cranberry ?Sports drinks like Gatorade (NO RED) ?Clear broth(vegetable,chicken,beef) ?     ?Drink  Ensure drink AT : 4:30 AM the day of surgery surgery.   ?  ?The day of surgery:  ?Drink ONE (1) Pre-Surgery Clear Ensure or G2 at AM the morning of surgery. Drink in one sitting. Do not sip.  ?This drink was given to you during your hospital  ?pre-op appointment visit. ?Nothing else to drink after completing the  ?Pre-Surgery Clear Ensure or G2. ?  ?       If you have questions, please contact your surgeon?s  office.  ?  ?Oral Hygiene is also important to reduce your risk of infection.                                    ?Remember - BRUSH YOUR TEETH THE MORNING OF SURGERY WITH YOUR REGULAR TOOTHPASTE ? ? Do NOT smoke after Midnight ? ? Take these medicines the morning of surgery with A SIP OF WATER:  amlodipine,omeprazole.Use inhalers as usual. ? ?DO NOT TAKE ANY ORAL DIABETIC MEDICATIONS DAY OF YOUR SURGERY ? ?Bring CPAP mask and tubing day of surgery. ?                  ?           You may not have any metal on your body including hair pins, jewelry, and body piercing ? ?           Do not wear  lotions, powders, perfumes/cologne, or deodorant ? ?            Men may shave face and neck. ? ? Do not bring valuables to the hospital. [facility] NOT ?            RESPONSIBLE   FOR VALUABLES. ? ? Contacts, dentures or bridgework may not be worn into surgery. ? ? Bring small overnight bag day of surgery. ?  ? Patients discharged on the day of surgery will not be allowed to drive home.  Someone NEEDS to stay with you for the first 24 hours after anesthesia. ? ? Special Instructions: Bring a copy of your healthcare power of attorney and living will documents         the day of surgery if you haven't scanned them before. ? ?            Please read over the following fact sheets you were given: IF [facility] [phone] ? ?   [facility] - Preparing for Surgery ?Before surgery, you can play an important role.  Because skin is not sterile, your skin needs to be as free of germs as possible.  You can reduce the number of germs on your skin by washing with CHG (chlorahexidine gluconate) soap before surgery.  CHG is an antiseptic cleaner which kills germs and bonds with the skin to continue killing germs even after washing. ?Please DO NOT use if you have an allergy to CHG or antibacterial soaps.  If your skin becomes reddened/irritated stop using the CHG and inform your nurse when  you arrive at Short Stay. ?Do not shave (including legs and underarms) for at least 48 hours prior to the first CHG shower.  You may shave your face/neck. ?Please follow these instructions carefully: ? 1.  Shower with CHG Soap the night before surgery and the  morning of Surgery. ? 2.  If you choose to wash your hair, wash your hair first as usual with your  normal  shampoo. ? 3.  After you shampoo, rinse your hair and body thoroughly to remove the  shampoo.                           4.  Use CHG as you would any other liquid soap.  You can apply chg directly  to the skin and wash  ?                     Gently with a scrungie or clean washcloth. ? 5.  Apply the CHG Soap to your body ONLY FROM THE NECK DOWN.   Do not use on face/ open      ?                     Wound or open sores. Avoid contact with eyes, ears mouth and genitals (private parts).  ?  Wash face,  Genitals (private parts) with your normal soap. ?            6.  Wash thoroughly, paying special attention to the area where your surgery  will be performed. ? 7.  Thoroughly rinse your body with warm water from the neck down. ? 8.  DO NOT shower/wash with your normal soap after using and rinsing off  the CHG Soap. ?               9.  Pat yourself dry with a clean towel. ?           10.  Wear clean pajamas. ?           11.  Place clean sheets on your bed the night of your first shower and do not  sleep with pets. ?Day of Surgery : ?Do not apply any lotions/deodorants the morning of surgery.  Please wear clean clothes to the hospital/surgery center. ? ?FAILURE TO FOLLOW THESE INSTRUCTIONS MAY RESULT IN THE CANCELLATION OF YOUR SURGERY ?PATIENT SIGNATURE_________________________________ ? ?NURSE SIGNATURE__________________________________ ? ?________________________________________________________________________  ? ? ?Incentive Spirometer ? ?An incentive spirometer is a tool that can help keep your lungs clear and active. This tool measures  how well you are filling your lungs with each breath. Taking long deep breaths may help reverse or decrease the chance of developing breathing (pulmonary) problems (especially infection) following: ?A long period of time when you are unable to move or be active. ?BEFORE THE PROCEDURE  ?If the spirometer includes an indicator to show your best effort, your nurse or respiratory therapist will set it to a desired goal. ?If possible, sit up straight or lean slightly forward. Try not to slouch. ?Hold the incentive spirometer in an upright position. ?INSTRUCTIONS FOR USE  ?Sit on the edge of your bed if possible, or sit up as far as you can in bed or on a chair. ?Hold the incentive spirometer in an upright position. ?Breathe out normally. ?Place the mouthpiece in your mouth and seal your lips tightly around it. ?Breathe in slowly and as deeply as possible, raising the piston or the ball toward the top of the column. ?Hold your breath for 3-5 seconds or for as long as possible. Allow the piston or ball to fall to the bottom of the column. ?Remove the mouthpiece from your mouth and breathe out normally. ?Rest for a few seconds and repeat Steps 1 through 7 at least 10 times every 1-2 hours when you are awake. Take your time and take a few normal breaths between deep breaths. ?The spirometer may include an indicator to show your best effort. Use the indicator as a goal to work toward during each repetition. ?After each set of 10 deep breaths, practice coughing to be sure your lungs are clear. If you have an incision (the cut made at the time of surgery), support your incision when coughing by placing a pillow or rolled up towels firmly against it. ?Once you are able to get out of bed, walk around indoors and cough well. You may stop using the incentive spirometer when instructed by your caregiver.  ?RISKS AND COMPLICATIONS ?Take your time so you do not get dizzy or light-headed. ?If you are in pain, you may need to take or  ask for pain medication before doing incentive spirometry. It is harder to take a deep breath if you are having pain. ?AFTER USE ?Rest and breathe slowly and easily. ?It can be helpful to keep  track of a log o

## 2021-07-14 ENCOUNTER — Other Ambulatory Visit: Payer: Self-pay

## 2021-07-14 ENCOUNTER — Encounter (HOSPITAL_COMMUNITY)
Admission: RE | Admit: 2021-07-14 | Discharge: 2021-07-14 | Disposition: A | Discharge status: Home / Self Care | Payer: Medicare Other | Source: Ambulatory Visit | Attending: Orthopedic Surgery | Admitting: Orthopedic Surgery

## 2021-07-14 ENCOUNTER — Encounter (HOSPITAL_COMMUNITY): Payer: Self-pay

## 2021-07-14 VITALS — BP 147/78 | HR 66 | Temp 98.1°F | Resp 18 | Ht 71.0 in | Wt 232.0 lb

## 2021-07-14 DIAGNOSIS — I10 Essential (primary) hypertension: Secondary | ICD-10-CM | POA: Diagnosis not present

## 2021-07-14 DIAGNOSIS — M1712 Unilateral primary osteoarthritis, left knee: Secondary | ICD-10-CM | POA: Diagnosis present

## 2021-07-14 DIAGNOSIS — Z01818 Encounter for other preprocedural examination: Secondary | ICD-10-CM | POA: Insufficient documentation

## 2021-07-14 LAB — CBC
HCT: 42.6 % (ref 39.0–52.0)
Hemoglobin: 14.1 g/dL (ref 13.0–17.0)
MCH: 31 pg (ref 26.0–34.0)
MCHC: 33.1 g/dL (ref 30.0–36.0)
MCV: 93.6 fL (ref 80.0–100.0)
Platelets: 158 10*3/uL (ref 150–400)
RBC: 4.55 MIL/uL (ref 4.22–5.81)
RDW: 14.7 % (ref 11.5–15.5)
WBC: 10.3 10*3/uL (ref 4.0–10.5)
nRBC: 0 % (ref 0.0–0.2)

## 2021-07-14 LAB — BASIC METABOLIC PANEL
Anion gap: 5 (ref 5–15)
BUN: 12 mg/dL (ref 8–23)
CO2: 25 mmol/L (ref 22–32)
Calcium: 9 mg/dL (ref 8.9–10.3)
Chloride: 110 mmol/L (ref 98–111)
Creatinine, Ser: 1.13 mg/dL (ref 0.61–1.24)
GFR, Estimated: >60 mL/min (ref >60)
Glucose, Bld: 120 mg/dL — ABNORMAL HIGH (ref 70–99)
Potassium: 4 mmol/L (ref 3.5–5.1)
Sodium: 140 mmol/L (ref 135–145)

## 2021-07-14 LAB — SURGICAL PCR SCREEN
MRSA, PCR: NEGATIVE
Staphylococcus aureus: NEGATIVE

## 2021-07-14 NOTE — H&P (Signed)
KNEE ARTHROPLASTY ADMISSION H&P ? ?Patient ID: ?Brandon Ferrell ?MRN: [id number] ?DOB/AGE: [birth year] 66 y.o. ? ?Chief Complaint: left knee pain. ? ?Planned Procedure Date: 07/27/21 ?Medical Clearance by Docia Barrier ? ? ? ?HPI: ?Brandon Ferrell is a 66 y.o. male who presents for evaluation of djd left knee. The patient has a history of pain and functional disability in the left knee due to arthritis and has failed non-surgical conservative treatments for greater than 12 weeks to include NSAID's and/or analgesics and activity modification.  Onset of symptoms was gradual, starting 5 years ago with gradually worsening course since that time. The patient noted prior procedures on the knee to include  arthroscopy and menisectomy on the left knee.  Patient currently rates pain at 8 out of 10 with activity. Patient has worsening of pain with activity and weight bearing and pain that interferes with activities of daily living.  Patient has evidence of joint space narrowing by imaging studies.  There is no active infection. ? ?Past Medical History:  ?Diagnosis Date  ? Acute renal insufficiency 07/18/2015  ? Acute urinary retention 07/18/2015  ? Arthritis   ? Asthma   ? as a child  ? COPD (chronic obstructive pulmonary disease) ([place])   ? GERD (gastroesophageal reflux disease)   ? occ zantac  ? Hx of adenomatous colonic polyps 08/30/2017  ? Hyperlipidemia   ? Hypertension   ? Palpitations   ? Pneumonia 2017  ? hx  ? Primary localized osteoarthrosis of left shoulder 12/04/2018  ? ?Past Surgical History:  ?Procedure Laterality Date  ? BICEPT TENODESIS Left 08/13/2020  ? Procedure: OPEN BICEPS TENODESIS;  Surgeon: Marchia Bond, MD;  Location: [facility];  Service: Orthopedics;  Laterality: Left;  ? CERVICAL FUSION    ? GROIN EXPLORATION Right 07/03/15  ? cyst removed- local anesthesia [place]  ? LUNG BIOPSY  13  ? pneum, dx copd  ? TOTAL HIP ARTHROPLASTY Right 11/07/2014  ? Procedure: RIGHT  TOTAL HIP ARTHROPLASTY;   Surgeon: Earlie Server, MD;  Location: [facility];  Service: Orthopedics;  Laterality: Right;  Would like to flip if possible ? ?  ? TOTAL HIP ARTHROPLASTY Left 07/17/2015  ? Procedure: LEFT TOTAL HIP ARTHROPLASTY;  Surgeon: Earlie Server, MD;  Location: [facility];  Service: Orthopedics;  Laterality: Left;  ? TOTAL SHOULDER ARTHROPLASTY Left 12/04/2018  ? Procedure: TOTAL SHOULDER ARTHROPLASTY;  Surgeon: Marchia Bond, MD;  Location[facility];  Service: Orthopedics;  Laterality: Left;  ? VASECTOMY  5/15  ? ?Allergies  ?Allergen Reactions  ? Bee Venom Anaphylaxis  ? Shellfish Allergy Anaphylaxis and Hives  ?  Lobester, Spider Bites  ? ?Prior to Admission medications   ?Medication Sig Start Date End Date Taking? Authorizing Provider  ?albuterol (PROVENTIL HFA;VENTOLIN HFA) 108 (90 BASE) MCG/ACT inhaler Inhale 2 puffs into the lungs every 6 (six) hours as needed for wheezing or shortness of breath.    [provider]  ?amLODipine (NORVASC) 5 MG tablet Take 5 mg by mouth daily.    [provider]  ?diphenhydrAMINE (BENADRYL) 25 MG tablet Take 25 mg by mouth every 6 (six) hours as needed for allergies or sleep.    [provider]  ?Fluticasone-Umeclidin-Vilant (TRELEGY ELLIPTA) 200-62.5-25 MCG/INH AEPB Inhale 1 Inhaler into the lungs in the morning.    [provider]  ?gabapentin (NEURONTIN) 300 MG capsule Take 300 mg by mouth at bedtime.  08/15/17   [provider]  ?HYDROmorphone (DILAUDID) 2 MG tablet Take 1 tablet (  2 mg total) by mouth every 6 (six) hours as needed for severe pain. 08/13/20   Ventura Bruns, PA-C  ?naloxone [facility]) nasal spray 4 mg/0.1 mL SMARTSIG:Both Nares 02/23/21   [provider]  ?omeprazole (PRILOSEC) 20 MG capsule Take 20 mg by mouth daily.    [provider]  ?Oxycodone HCl 10 MG TABS Take 1 tablet by mouth as needed. XI'P by Dr. Frances Furbish 03/06/19   [provider]  ?rosuvastatin (CRESTOR) 20 MG tablet Take 20 mg by mouth daily.   07/24/17   [provider]  ? ?Social History  ? ?Socioeconomic History  ? Marital status: Married  ?  Spouse name: Not on file  ? Number of children: Not on file  ? Years of education: Not on file  ? Highest education level: Not on file  ?Occupational History  ? Not on file  ?Tobacco Use  ? Smoking status: Some Days  ?  Packs/day: 1.00  ?  Years: 40.00  ?  Pack years: 40.00  ?  Types: Cigars, Cigarettes  ? Smokeless tobacco: Never  ? Tobacco comments:  ?  5 cigars daily  ?Vaping Use  ? Vaping Use: Never used  ?Substance and Sexual Activity  ? Alcohol use: Yes  ?  Comment: occas.  ? Drug use: No  ? Sexual activity: Not on file  ?Other Topics Concern  ? Not on file  ?Social History Narrative  ? Not on file  ? ?Social Determinants of Health  ? ?Financial Resource Strain: Not on file  ?Food Insecurity: Not on file  ?Transportation Needs: Not on file  ?Physical Activity: Not on file  ?Stress: Not on file  ?Social Connections: Not on file  ? ?Family History  ?Problem Relation Age of Onset  ? Colon cancer Maternal Grandfather   ? Stomach cancer Father   ? Colon polyps Neg Hx   ? Esophageal cancer Neg Hx   ? Rectal cancer Neg Hx   ? ? ?ROS: Currently denies lightheadedness, dizziness, Fever, chills, CP, SOB.   ?No personal history of DVT, PE, MI, or CVA. He has full dentures.  ?All other systems have been reviewed and were otherwise currently negative with the exception of those mentioned in the HPI and as above. ? ?Objective: ?Vitals: Ht: '5\' 11"'$  Wt: 234.4 lbs Temp: 98.2 BP: 176/96 Pulse: 66 O2 98% on room air.   ?Physical Exam: ?General: Alert, NAD.  Antalgic Gait  ?HEENT: EOMI, Good Neck Extension  ?Pulm: No increased work of breathing.  Clear B/L A/P w/o crackle or wheeze.  ?CV: RRR, No m/g/r appreciated  ?GI: soft, NT, ND ?Neuro: Neuro without gross focal deficit.  Sensation intact distally ?Skin: No lesions in the area of chief complaint ?MSK/Surgical Site: left knee w/o redness or effusion.  + medial JLT. ROM  0-100.  5/5 strength in extension and flexion.  +EHL/FHL.  NVI.  Stable varus and valgus stress.  ? ? ?Imaging Review ?Plain radiographs demonstrate severe degenerative joint disease of the left knee.  ? ?The overall alignment is varus. The bone quality appears to be adequate for age and reported activity level. ? ?Preoperative templating of the joint replacement has been completed, documented, and submitted to the Operating Room personnel in order to optimize intra-operative equipment management. ? ?Assessment: ?djd left knee ?Principal Problem: ?  Osteoarthritis of left knee ? ? ?Plan: ?Plan for Procedure(s): ?UNICOMPARTMENTAL KNEE ? ?The patient history, physical exam, clinical judgement of the provider and imaging are consistent with  end stage degenerative joint disease and unicompartmenatl joint arthroplasty is deemed medically necessary. The treatment options including medical management, injection therapy, and arthroplasty were discussed at length. The risks and benefits of Procedure(s): ?UNICOMPARTMENTAL KNEE were presented and reviewed.  ?The risks of nonoperative treatment, versus surgical intervention including but not limited to continued pain, aseptic loosening, stiffness, dislocation/subluxation, infection, bleeding, nerve injury, blood clots, cardiopulmonary complications, morbidity, mortality, among others were discussed. The patient verbalizes understanding and wishes to proceed with the plan.  ?Patient is being admitted for inpatient treatment for surgery, pain control, PT, prophylactic antibiotics, VTE prophylaxis, progressive ambulation, ADL's and discharge planning.  ? ?Dental prophylaxis discussed and recommended for 2 years postoperatively. ? ?The patient does  meet the criteria for TXA which will be used perioperatively.   ?ASA 325 mg  will be used postoperatively for DVT prophylaxis in addition to SCDs, and early ambulation. ?The patient is planning to be discharged home with OPPT in care of  family ? ? ?Ventura Bruns, PA-C ?07/14/2021 ?2:47 PM  ?

## 2021-07-14 NOTE — Progress Notes (Signed)
For Short Stay: ?[facility] appointment date: ?Date of COVID positive in last 90 days: ?COVID Vaccine: J & J ?Bowel Prep reminder: ? ? ?For Anesthesia: ?PCP - Docia Barrier: PA ?Cardiologist -  ? ?Chest x-ray -  ?EKG -  ?Stress Test -  ?ECHO -  ?Cardiac Cath -  ?Pacemaker/ICD device last checked: ?Pacemaker orders received: ?Device Rep notified: ? ?Spinal Cord Stimulator: ? ?Sleep Study -  ?CPAP -  ? ?Fasting Blood Sugar -  ?Checks Blood Sugar _____ times a day ?Date and result of last Hgb A1c- ? ?Blood Thinner Instructions: ?Aspirin Instructions: ?Last Dose: ? ?Activity level: Can go up a flight of stairs and activities of daily living without stopping and without chest pain and/or shortness of breath ?  Able to exercise without chest pain and/or shortness of breath ?  Unable to go up a flight of stairs without chest pain and/or shortness of breath ?   ? ?Anesthesia review: Hx: Smoker,ARF,COPD,HTN,palpitations ? ?Patient denies shortness of breath, fever, cough and chest pain at PAT appointment ? ? ?Patient verbalized understanding of instructions that were given to them at the PAT appointment. Patient was also instructed that they will need to review over the PAT instructions again at home before surgery.  ?

## 2021-07-15 DIAGNOSIS — Z79899 Other long term (current) drug therapy: Secondary | ICD-10-CM | POA: Diagnosis not present

## 2021-07-20 NOTE — Care Plan (Signed)
Ortho Bundle Case Management Note ? ?Patient Details  ?Name: Brandon Ferrell ?MRN: [id number] ?Date of Birth: [birth year] ? ?  Met with patient in the office prior to surgery. He will discharge to home with family to assist. Has equipment at home. OPPT set up with [facility]. Patient and MD in agreement with plan. Choice offered              ? ? ? ?DME Arranged:    ?DME Agency:    ? ?HH Arranged:    ?[place] Agency:    ? ?Additional Comments: ?Please contact me with any questions of if this plan should need to change. ? ?Mardelle Matte  [facility] Orthopaedic Specialist  [phone] ?07/20/2021, 11:35 AM ?  ?

## 2021-07-26 NOTE — Anesthesia Preprocedure Evaluation (Addendum)
Anesthesia Evaluation  ?Patient identified by MRN, date of birth, ID band ?Patient awake ? ? ? ?Reviewed: ?Allergy & Precautions, NPO status , Patient's Chart, lab work & pertinent test results ? ?Airway ?Mallampati: II ? ?TM Distance: >3 FB ?Neck ROM: Full ? ? ? Dental ? ?(+) Dental Advisory Given, Edentulous Upper, Edentulous Lower ?  ?Pulmonary ?asthma , COPD,  COPD inhaler, Current SmokerPatient did not abstain from smoking.,  ?  ?Pulmonary exam normal ?breath sounds clear to auscultation ? ? ? ? ? ? Cardiovascular ?hypertension, Pt. on medications ?Normal cardiovascular exam ?Rhythm:Regular Rate:Normal ? ? ?  ?Neuro/Psych ?negative neurological ROS ? negative psych ROS  ? GI/Hepatic ?Neg liver ROS, GERD  Medicated,  ?Endo/Other  ?negative endocrine ROSObesity ? ? Renal/GU ?negative Renal ROS  ? ?  ?Musculoskeletal ? ?(+) Arthritis ,  ? Abdominal ?  ?Peds ? Hematology ?negative hematology ROS ?(+)   ?Anesthesia Other Findings ? ? Reproductive/Obstetrics ? ?  ? ? ? ? ? ? ? ? ? ? ? ? ? ?  ?  ? ? ? ? ? ? ? ?Anesthesia Physical ?Anesthesia Plan ? ?ASA: 3 ? ?Anesthesia Plan: Spinal  ? ?Post-op Pain Management: Regional block*, Tylenol PO (pre-op)* and Toradol IV (intra-op)*  ? ?Induction: Intravenous ? ?PONV Risk Score and Plan: 2 and Midazolam, Ondansetron, Dexamethasone and TIVA ? ?Airway Management Planned: Nasal Cannula and Natural Airway ? ?Additional Equipment:  ? ?Intra-op Plan:  ? ?Post-operative Plan:  ? ?Informed Consent: I have reviewed the patients History and Physical, chart, labs and discussed the procedure including the risks, benefits and alternatives for the proposed anesthesia with the patient or authorized representative who has indicated his/her understanding and acceptance.  ? ? ? ? ? ?Plan Discussed with: CRNA ? ?Anesthesia Plan Comments:   ? ? ? ? ? ?Anesthesia Quick Evaluation ? ?

## 2021-07-27 ENCOUNTER — Ambulatory Visit (HOSPITAL_COMMUNITY): Payer: Medicare Other

## 2021-07-27 ENCOUNTER — Encounter (HOSPITAL_COMMUNITY)
Admission: RE | Disposition: A | Discharge status: Home / Self Care | Payer: Self-pay | Source: Ambulatory Visit | Attending: Orthopedic Surgery

## 2021-07-27 ENCOUNTER — Other Ambulatory Visit: Payer: Self-pay

## 2021-07-27 ENCOUNTER — Ambulatory Visit (HOSPITAL_BASED_OUTPATIENT_CLINIC_OR_DEPARTMENT_OTHER): Payer: Medicare Other | Admitting: Anesthesiology

## 2021-07-27 ENCOUNTER — Ambulatory Visit (HOSPITAL_COMMUNITY)
Admission: RE | Admit: 2021-07-27 | Discharge: 2021-07-27 | Disposition: A | Discharge status: Home / Self Care | Payer: Medicare Other | Source: Ambulatory Visit | Attending: Orthopedic Surgery | Admitting: Orthopedic Surgery

## 2021-07-27 ENCOUNTER — Encounter (HOSPITAL_COMMUNITY): Payer: Self-pay | Admitting: Orthopedic Surgery

## 2021-07-27 ENCOUNTER — Ambulatory Visit (HOSPITAL_COMMUNITY): Payer: Medicare Other | Admitting: Anesthesiology

## 2021-07-27 DIAGNOSIS — I10 Essential (primary) hypertension: Secondary | ICD-10-CM | POA: Insufficient documentation

## 2021-07-27 DIAGNOSIS — M1990 Unspecified osteoarthritis, unspecified site: Secondary | ICD-10-CM | POA: Diagnosis not present

## 2021-07-27 DIAGNOSIS — Z6832 Body mass index (BMI) 32.0-32.9, adult: Secondary | ICD-10-CM | POA: Insufficient documentation

## 2021-07-27 DIAGNOSIS — Z79899 Other long term (current) drug therapy: Secondary | ICD-10-CM | POA: Insufficient documentation

## 2021-07-27 DIAGNOSIS — K219 Gastro-esophageal reflux disease without esophagitis: Secondary | ICD-10-CM | POA: Diagnosis not present

## 2021-07-27 DIAGNOSIS — F17210 Nicotine dependence, cigarettes, uncomplicated: Secondary | ICD-10-CM

## 2021-07-27 DIAGNOSIS — E669 Obesity, unspecified: Secondary | ICD-10-CM | POA: Diagnosis not present

## 2021-07-27 DIAGNOSIS — F17200 Nicotine dependence, unspecified, uncomplicated: Secondary | ICD-10-CM | POA: Insufficient documentation

## 2021-07-27 DIAGNOSIS — Z96652 Presence of left artificial knee joint: Secondary | ICD-10-CM | POA: Diagnosis not present

## 2021-07-27 DIAGNOSIS — Z471 Aftercare following joint replacement surgery: Secondary | ICD-10-CM | POA: Diagnosis not present

## 2021-07-27 DIAGNOSIS — Z96642 Presence of left artificial hip joint: Secondary | ICD-10-CM | POA: Diagnosis not present

## 2021-07-27 DIAGNOSIS — M1712 Unilateral primary osteoarthritis, left knee: Secondary | ICD-10-CM | POA: Insufficient documentation

## 2021-07-27 DIAGNOSIS — J449 Chronic obstructive pulmonary disease, unspecified: Secondary | ICD-10-CM

## 2021-07-27 DIAGNOSIS — G8918 Other acute postprocedural pain: Secondary | ICD-10-CM | POA: Diagnosis not present

## 2021-07-27 DIAGNOSIS — M1711 Unilateral primary osteoarthritis, right knee: Secondary | ICD-10-CM | POA: Diagnosis not present

## 2021-07-27 HISTORY — PX: PARTIAL KNEE ARTHROPLASTY: SHX2174

## 2021-07-27 SURGERY — ARTHROPLASTY, KNEE, UNICOMPARTMENTAL
Anesthesia: Spinal | Site: Knee | Laterality: Left

## 2021-07-27 MED ORDER — DEXAMETHASONE SODIUM PHOSPHATE 10 MG/ML IJ SOLN
INTRAMUSCULAR | Status: AC
  Filled 2021-07-27: qty 1

## 2021-07-27 MED ORDER — CEFAZOLIN SODIUM-DEXTROSE 2-4 GM/100ML-% IV SOLN
2.0000 g | INTRAVENOUS | Status: AC
  Administered 2021-07-27: 2 g via INTRAVENOUS
  Filled 2021-07-27: qty 100

## 2021-07-27 MED ORDER — 0.9 % SODIUM CHLORIDE (POUR BTL) OPTIME
TOPICAL | Status: DC | PRN
  Administered 2021-07-27: 1000 mL

## 2021-07-27 MED ORDER — ONDANSETRON HCL 4 MG PO TABS: 4.0000 mg | ORAL_TABLET | Freq: Three times a day (TID) | ORAL | 0 refills | Status: AC | PRN

## 2021-07-27 MED ORDER — MIDAZOLAM HCL 5 MG/5ML IJ SOLN
INTRAMUSCULAR | Status: DC | PRN
  Administered 2021-07-27: 2 mg via INTRAVENOUS

## 2021-07-27 MED ORDER — MIDAZOLAM HCL 2 MG/2ML IJ SOLN
INTRAMUSCULAR | Status: AC
  Filled 2021-07-27: qty 2

## 2021-07-27 MED ORDER — FENTANYL CITRATE (PF) 100 MCG/2ML IJ SOLN
INTRAMUSCULAR | Status: DC | PRN
  Administered 2021-07-27 (×2): 50 ug via INTRAVENOUS

## 2021-07-27 MED ORDER — LACTATED RINGERS IV BOLUS
250.0000 mL | Freq: Once | INTRAVENOUS | Status: AC
  Administered 2021-07-27: 250 mL via INTRAVENOUS

## 2021-07-27 MED ORDER — BUPIVACAINE IN DEXTROSE 0.75-8.25 % IT SOLN
INTRATHECAL | Status: DC | PRN
  Administered 2021-07-27: 1.8 mL via INTRATHECAL

## 2021-07-27 MED ORDER — FENTANYL CITRATE (PF) 100 MCG/2ML IJ SOLN
INTRAMUSCULAR | Status: AC
  Filled 2021-07-27: qty 2

## 2021-07-27 MED ORDER — METHOCARBAMOL 500 MG IVPB - SIMPLE MED
500.0000 mg | Freq: Four times a day (QID) | INTRAVENOUS | Status: DC | PRN
  Administered 2021-07-27: 500 mg via INTRAVENOUS
  Filled 2021-07-27: qty 50

## 2021-07-27 MED ORDER — OXYCODONE HCL 5 MG PO TABS
10.0000 mg | ORAL_TABLET | Freq: Four times a day (QID) | ORAL | Status: DC
  Administered 2021-07-27: 10 mg via ORAL

## 2021-07-27 MED ORDER — BUPIVACAINE HCL 0.25 % IJ SOLN
INTRAMUSCULAR | Status: AC
  Filled 2021-07-27: qty 1

## 2021-07-27 MED ORDER — SODIUM CHLORIDE 0.9 % IR SOLN
Status: DC | PRN
  Administered 2021-07-27: 1000 mL

## 2021-07-27 MED ORDER — ACETAMINOPHEN 500 MG PO TABS: 1000.0000 mg | ORAL_TABLET | Freq: Once | ORAL | Status: DC

## 2021-07-27 MED ORDER — PROPOFOL 10 MG/ML IV BOLUS
INTRAVENOUS | Status: AC
  Filled 2021-07-27: qty 20

## 2021-07-27 MED ORDER — OXYCODONE HCL 5 MG PO TABS
ORAL_TABLET | ORAL | Status: AC
  Filled 2021-07-27: qty 2

## 2021-07-27 MED ORDER — CEFAZOLIN SODIUM-DEXTROSE 2-4 GM/100ML-% IV SOLN
INTRAVENOUS | Status: AC
  Filled 2021-07-27: qty 100

## 2021-07-27 MED ORDER — BUPIVACAINE HCL 0.25 % IJ SOLN
INTRAMUSCULAR | Status: DC | PRN
  Administered 2021-07-27: 30 mL

## 2021-07-27 MED ORDER — PHENYLEPHRINE HCL (PRESSORS) 10 MG/ML IV SOLN
INTRAVENOUS | Status: AC
  Filled 2021-07-27: qty 1

## 2021-07-27 MED ORDER — BACLOFEN 10 MG PO TABS: 10.0000 mg | ORAL_TABLET | Freq: Three times a day (TID) | ORAL | 0 refills | Status: AC

## 2021-07-27 MED ORDER — ORAL CARE MOUTH RINSE: 15.0000 mL | Freq: Once | OROMUCOSAL | Status: AC

## 2021-07-27 MED ORDER — ACETAMINOPHEN 500 MG PO TABS
1000.0000 mg | ORAL_TABLET | Freq: Once | ORAL | Status: AC
  Administered 2021-07-27: 1000 mg via ORAL
  Filled 2021-07-27: qty 2

## 2021-07-27 MED ORDER — LIDOCAINE HCL (PF) 2 % IJ SOLN
INTRAMUSCULAR | Status: AC
  Filled 2021-07-27: qty 5

## 2021-07-27 MED ORDER — CHLORHEXIDINE GLUCONATE 0.12 % MT SOLN
15.0000 mL | Freq: Once | OROMUCOSAL | Status: AC
  Administered 2021-07-27: 15 mL via OROMUCOSAL

## 2021-07-27 MED ORDER — ONDANSETRON HCL 4 MG/2ML IJ SOLN
INTRAMUSCULAR | Status: AC
Start: 2021-07-27 — End: ?
  Filled 2021-07-27: qty 2

## 2021-07-27 MED ORDER — ROPIVACAINE HCL 5 MG/ML IJ SOLN
INTRAMUSCULAR | Status: DC | PRN
  Administered 2021-07-27: 25 mL via PERINEURAL

## 2021-07-27 MED ORDER — KETOROLAC TROMETHAMINE 30 MG/ML IJ SOLN
INTRAMUSCULAR | Status: DC | PRN
  Administered 2021-07-27: 30 mg

## 2021-07-27 MED ORDER — ASPIRIN EC 325 MG PO TBEC: 325.0000 mg | DELAYED_RELEASE_TABLET | Freq: Two times a day (BID) | ORAL | 0 refills | Status: AC

## 2021-07-27 MED ORDER — ONDANSETRON HCL 4 MG/2ML IJ SOLN
INTRAMUSCULAR | Status: DC | PRN
  Administered 2021-07-27: 4 mg via INTRAVENOUS

## 2021-07-27 MED ORDER — POVIDONE-IODINE 10 % EX SWAB: 2.0000 "application " | Freq: Once | CUTANEOUS | Status: DC

## 2021-07-27 MED ORDER — STERILE WATER FOR IRRIGATION IR SOLN
Status: DC | PRN
  Administered 2021-07-27: 2000 mL

## 2021-07-27 MED ORDER — KETOROLAC TROMETHAMINE 30 MG/ML IJ SOLN
INTRAMUSCULAR | Status: AC
  Filled 2021-07-27: qty 1

## 2021-07-27 MED ORDER — CEFAZOLIN SODIUM-DEXTROSE 2-4 GM/100ML-% IV SOLN
2.0000 g | Freq: Four times a day (QID) | INTRAVENOUS | Status: DC
  Administered 2021-07-27: 2 g via INTRAVENOUS

## 2021-07-27 MED ORDER — POVIDONE-IODINE 7.5 % EX SOLN: Freq: Once | CUTANEOUS | Status: DC

## 2021-07-27 MED ORDER — METHOCARBAMOL 500 MG IVPB - SIMPLE MED
INTRAVENOUS | Status: AC
  Filled 2021-07-27: qty 50

## 2021-07-27 MED ORDER — HYDROMORPHONE HCL 2 MG PO TABS: 2.0000 mg | ORAL_TABLET | ORAL | 0 refills | Status: AC | PRN

## 2021-07-27 MED ORDER — LACTATED RINGERS IV BOLUS
500.0000 mL | Freq: Once | INTRAVENOUS | Status: AC
  Administered 2021-07-27: 500 mL via INTRAVENOUS

## 2021-07-27 MED ORDER — ONDANSETRON HCL 4 MG/2ML IJ SOLN: 4.0000 mg | Freq: Once | INTRAMUSCULAR | Status: DC | PRN

## 2021-07-27 MED ORDER — FENTANYL CITRATE PF 50 MCG/ML IJ SOSY: 25.0000 ug | PREFILLED_SYRINGE | INTRAMUSCULAR | Status: DC | PRN

## 2021-07-27 MED ORDER — DEXAMETHASONE SODIUM PHOSPHATE 10 MG/ML IJ SOLN
INTRAMUSCULAR | Status: DC | PRN
  Administered 2021-07-27: 10 mg
  Administered 2021-07-27: 8 mg

## 2021-07-27 MED ORDER — TRANEXAMIC ACID-NACL 1000-0.7 MG/100ML-% IV SOLN
INTRAVENOUS | Status: AC
  Administered 2021-07-27: 1000 mg via INTRAVENOUS
  Filled 2021-07-27: qty 100

## 2021-07-27 MED ORDER — SENNA-DOCUSATE SODIUM 8.6-50 MG PO TABS: 2.0000 | ORAL_TABLET | Freq: Every day | ORAL | 1 refills | Status: AC

## 2021-07-27 MED ORDER — TRANEXAMIC ACID-NACL 1000-0.7 MG/100ML-% IV SOLN: 1000.0000 mg | Freq: Once | INTRAVENOUS | Status: AC

## 2021-07-27 MED ORDER — PROPOFOL 1000 MG/100ML IV EMUL
INTRAVENOUS | Status: AC
Start: 2021-07-27 — End: ?
  Filled 2021-07-27: qty 100

## 2021-07-27 MED ORDER — PROPOFOL 500 MG/50ML IV EMUL
INTRAVENOUS | Status: DC | PRN
Start: 2021-07-27 — End: 2021-07-27
  Administered 2021-07-27: 40 ug/kg/min via INTRAVENOUS

## 2021-07-27 MED ORDER — TRANEXAMIC ACID-NACL 1000-0.7 MG/100ML-% IV SOLN
1000.0000 mg | INTRAVENOUS | Status: AC
  Administered 2021-07-27: 1000 mg via INTRAVENOUS
  Filled 2021-07-27: qty 100

## 2021-07-27 MED ORDER — LACTATED RINGERS IV SOLN: INTRAVENOUS | Status: DC

## 2021-07-27 MED ORDER — METHOCARBAMOL 500 MG PO TABS: 500.0000 mg | ORAL_TABLET | Freq: Four times a day (QID) | ORAL | Status: DC | PRN

## 2021-07-27 SURGICAL SUPPLY — 74 items
APL PRP STRL LF DISP 70% ISPRP (MISCELLANEOUS) ×2
BAG COUNTER SPONGE SURGICOUNT (BAG) ×1 IMPLANT
BAG SPEC THK2 15X12 ZIP CLS (MISCELLANEOUS) ×1
BAG SPNG CNTER NS LX DISP (BAG) ×1
BAG ZIPLOCK 12X15 (MISCELLANEOUS) ×3 IMPLANT
BANDAGE ESMARK 6X9 LF (GAUZE/BANDAGES/DRESSINGS) ×2 IMPLANT
BLADE SURG 15 STRL LF DISP TIS (BLADE) ×2 IMPLANT
BLADE SURG 15 STRL SS (BLADE) ×2
BNDG CMPR 9X6 STRL LF SNTH (GAUZE/BANDAGES/DRESSINGS) ×1
BNDG CMPR MED 15X6 ELC VLCR LF (GAUZE/BANDAGES/DRESSINGS)
BNDG ELASTIC 6X15 VLCR STRL LF (GAUZE/BANDAGES/DRESSINGS) ×2 IMPLANT
BNDG ESMARK 6X9 LF (GAUZE/BANDAGES/DRESSINGS) ×2
BOWL SMART MIX CTS (DISPOSABLE) ×3 IMPLANT
BRNG TIB XL 4 PHS 3 LT MEN (Insert) ×1 IMPLANT
CEMENT BONE R 1X40 (Cement) ×3 IMPLANT
CHLORAPREP W/TINT 26 (MISCELLANEOUS) ×2 IMPLANT
CLSR STERI-STRIP ANTIMIC 1/2X4 (GAUZE/BANDAGES/DRESSINGS) ×3 IMPLANT
COVER SURGICAL LIGHT HANDLE (MISCELLANEOUS) ×3 IMPLANT
CUFF TOURN SGL QUICK 34 (TOURNIQUET CUFF) ×2
CUFF TRNQT CYL 34X4.125X (TOURNIQUET CUFF) ×2 IMPLANT
DRAPE EXTREMITY T 121X128X90 (DISPOSABLE) ×3 IMPLANT
DRAPE IMP U-DRAPE 54X76 (DRAPES) ×1 IMPLANT
DRAPE POUCH INSTRU U-SHP 10X18 (DRAPES) ×3 IMPLANT
DRAPE SHEET LG 3/4 BI-LAMINATE (DRAPES) ×3 IMPLANT
DRAPE U-SHAPE 47X51 STRL (DRAPES) ×3 IMPLANT
DRSG MEPILEX BORDER 4X12 (GAUZE/BANDAGES/DRESSINGS) ×1 IMPLANT
DRSG MEPILEX BORDER 4X8 (GAUZE/BANDAGES/DRESSINGS) ×2 IMPLANT
DRSG PAD ABDOMINAL 8X10 ST (GAUZE/BANDAGES/DRESSINGS) ×4 IMPLANT
DURAPREP 26ML APPLICATOR (WOUND CARE) ×4 IMPLANT
ELECT REM PT RETURN 15FT ADLT (MISCELLANEOUS) ×3 IMPLANT
FACESHIELD WRAPAROUND (MASK) ×4 IMPLANT
FACESHIELD WRAPAROUND OR TEAM (MASK) ×2 IMPLANT
GLOVE BIO SURGEON STRL SZ7 (GLOVE) ×3 IMPLANT
GLOVE BIOGEL PI IND STRL 7.0 (GLOVE) ×2 IMPLANT
GLOVE BIOGEL PI IND STRL 8 (GLOVE) ×2 IMPLANT
GLOVE BIOGEL PI INDICATOR 7.0 (GLOVE) ×1
GLOVE BIOGEL PI INDICATOR 8 (GLOVE) ×1
GLOVE SURG POLYISO LF SZ7.5 (GLOVE) ×3 IMPLANT
GOWN STRL REUS W/ TWL LRG LVL3 (GOWN DISPOSABLE) ×4 IMPLANT
GOWN STRL REUS W/TWL LRG LVL3 (GOWN DISPOSABLE) ×4
HANDPIECE INTERPULSE COAX TIP (DISPOSABLE) ×2
HOLDER FOLEY CATH W/STRAP (MISCELLANEOUS) ×1 IMPLANT
HOOD PEEL AWAY FLYTE STAYCOOL (MISCELLANEOUS) ×6 IMPLANT
IMMOBILIZER KNEE 20 (SOFTGOODS)
IMMOBILIZER KNEE 20 THIGH 36 (SOFTGOODS) IMPLANT
IMMOBILIZER KNEE 22 UNIV (SOFTGOODS) ×1 IMPLANT
INSERT TIB BEAR UNI 4X4 LT (Insert) ×1 IMPLANT
KIT BASIN OR (CUSTOM PROCEDURE TRAY) ×3 IMPLANT
KIT TURNOVER KIT A (KITS) ×1 IMPLANT
NDL SAFETY ECLIPSE 18X1.5 (NEEDLE) ×2 IMPLANT
NEEDLE HYPO 18GX1.5 SHARP (NEEDLE) ×2
NS IRRIG 1000ML POUR BTL (IV SOLUTION) ×3 IMPLANT
PACK BLADE SAW RECIP 70 3 PT (BLADE) ×1 IMPLANT
PACK TOTAL JOINT (CUSTOM PROCEDURE TRAY) ×3 IMPLANT
PROTECTOR NERVE ULNAR (MISCELLANEOUS) ×3 IMPLANT
SET HNDPC FAN SPRY TIP SCT (DISPOSABLE) ×2 IMPLANT
SPIKE FLUID TRANSFER (MISCELLANEOUS) ×1 IMPLANT
STEM FEM PEGGED OXFORD XL (Peg) ×1 IMPLANT
STRIP CLOSURE SKIN 1/2X4 (GAUZE/BANDAGES/DRESSINGS) ×1 IMPLANT
SUCTION FRAZIER HANDLE 12FR (TUBING) ×2
SUCTION TUBE FRAZIER 12FR DISP (TUBING) ×2 IMPLANT
SUT VIC AB 1 CT1 36 (SUTURE) ×3 IMPLANT
SUT VIC AB 2-0 CT1 27 (SUTURE) ×2
SUT VIC AB 2-0 CT1 TAPERPNT 27 (SUTURE) ×2 IMPLANT
SUT VIC AB 3-0 SH 8-18 (SUTURE) ×3 IMPLANT
SYR 30ML LL (SYRINGE) ×3 IMPLANT
SYR 3ML LL SCALE MARK (SYRINGE) ×3 IMPLANT
TOWEL OR 17X26 10 PK STRL BLUE (TOWEL DISPOSABLE) ×3 IMPLANT
TOWEL OR NON WOVEN STRL DISP B (DISPOSABLE) ×3 IMPLANT
TRAY FOLEY MTR SLVR 16FR STAT (SET/KITS/TRAYS/PACK) ×3 IMPLANT
TRAY TIBIAL OXFORD SZ D LF (Joint) ×1 IMPLANT
TUBE SUCTION HIGH CAP CLEAR NV (SUCTIONS) ×3 IMPLANT
WATER STERILE IRR 1000ML POUR (IV SOLUTION) ×3 IMPLANT
WRAP KNEE MAXI GEL POST OP (GAUZE/BANDAGES/DRESSINGS) ×3 IMPLANT

## 2021-07-27 NOTE — Anesthesia Procedure Notes (Signed)
Spinal ? ?Patient location during procedure: OR ?Start time: 07/27/2021 7:32 AM ?End time: 07/27/2021 7:40 AM ?Reason for block: surgical anesthesia ?Staffing ?Performed: anesthesiologist  ?Anesthesiologist: Santa Lighter, MD ?Preanesthetic Checklist ?Completed: patient identified, IV checked, risks and benefits discussed, surgical consent, monitors and equipment checked, pre-op evaluation and timeout performed ?Spinal Block ?Patient position: sitting ?Prep: DuraPrep and site prepped and draped ?Patient monitoring: continuous pulse ox and blood pressure ?Approach: midline ?Location: L3-4 ?Injection technique: single-shot ?Needle ?Needle type: Pencan  ?Needle gauge: 24 G ?Assessment ?Events: CSF return and second provider ?Additional Notes ?Functioning IV was confirmed and monitors were applied. Sterile prep and drape, including hand hygiene, mask and sterile gloves were used. The patient was positioned and the spine was prepped. The skin was anesthetized with lidocaine.  Free flow of clear CSF was obtained prior to injecting local anesthetic into the CSF.  The spinal needle aspirated freely following injection.  The needle was carefully withdrawn.  The patient tolerated the procedure well. Consent was obtained prior to procedure with all questions answered and concerns addressed. Risks including but not limited to bleeding, infection, nerve damage, paralysis, failed block, inadequate analgesia, allergic reaction, high spinal, itching and headache were discussed and the patient wished to proceed.  ? ?Hoy Morn, MD ? ? ? ?

## 2021-07-27 NOTE — Interval H&P Note (Signed)
History and Physical Interval Note: ? ?07/27/2021 ?7:13 AM ? ?Brandon Ferrell  has presented today for surgery, with the diagnosis of djd left knee.  The various methods of treatment have been discussed with the patient and family. After consideration of risks, benefits and other options for treatment, the patient has consented to  Procedure(s): ?UNICOMPARTMENTAL KNEE (Left) as a surgical intervention.  The patient's history has been reviewed, patient examined, no change in status, stable for surgery.  I have reviewed the patient's chart and labs.  Questions were answered to the patient's satisfaction.   ? ? ?[name] ? ? ?

## 2021-07-27 NOTE — Transfer of Care (Signed)
Immediate Anesthesia Transfer of Care Note ? ?Patient: Brandon Ferrell ? ?Procedure(s) Performed: UNICOMPARTMENTAL KNEE (Left: Knee) ? ?Patient Location: PACU ? ?Anesthesia Type:MAC and Spinal ? ?Level of Consciousness: awake, alert , oriented and patient cooperative ? ?Airway & Oxygen Therapy: Patient Spontanous Breathing and Patient connected to face mask oxygen ? ?Post-op Assessment: Report given to RN and Post -op Vital signs reviewed and stable ? ?Post vital signs: Reviewed and stable ? ?Last Vitals:  ?Vitals Value Taken Time  ?BP 139/96 07/27/21 1012  ?Temp    ?Pulse 53 07/27/21 1014  ?Resp 15 07/27/21 1014  ?SpO2 100 % 07/27/21 1014  ?Vitals shown include unvalidated device data. ? ?Last Pain:  ?Vitals:  ? 07/27/21 0620  ?TempSrc: Oral  ?PainSc:   ?   ? ?  ? ?Complications: No notable events documented. ?

## 2021-07-27 NOTE — Discharge Instructions (Signed)
INSTRUCTIONS AFTER JOINT REPLACEMENT  ? ?Remove items at home which could result in a fall. This includes throw rugs or furniture in walking pathways ?ICE to the affected joint every three hours while awake for 30 minutes at a time, for at least the first 3-5 days, and then as needed for pain and swelling.  Continue to use ice for pain and swelling. You may notice swelling that will progress down to the foot and ankle.  This is normal after surgery.  Elevate your leg when you are not up walking on it.   ?Continue to use the breathing machine you got in the hospital (incentive spirometer) which will help keep your temperature down.  It is common for your temperature to cycle up and down following surgery, especially at night when you are not up moving around and exerting yourself.  The breathing machine keeps your lungs expanded and your temperature down. ? ? ?DIET:  As you were doing prior to hospitalization, we recommend a well-balanced diet. ? ?DRESSING / WOUND CARE / SHOWERING ? ?You may shower 3 days after surgery, but keep the wounds dry during showering.  You may use an occlusive plastic wrap (Press'n Seal for example), NO SOAKING/SUBMERGING IN THE BATHTUB.  If the bandage gets wet, change with a clean dry gauze.  If the incision gets wet, pat the wound dry with a clean towel. ? ?ACTIVITY ? ?Increase activity slowly as tolerated, but follow the weight bearing instructions below.   ?No driving for 6 weeks or until further direction given by your physician.  You cannot drive while taking narcotics.  ?No lifting or carrying greater than 10 lbs. until further directed by your surgeon. ?Avoid periods of inactivity such as sitting longer than an hour when not asleep. This helps prevent blood clots.  ?You may return to work once you are authorized by your doctor.  ? ? ? ?WEIGHT BEARING  ? ?Weight bearing as tolerated with assist device (walker, cane, etc) as directed, use it as long as suggested by your surgeon or  therapist, typically at least 4-6 weeks. ? ? ?EXERCISES ? ?Results after joint replacement surgery are often greatly improved when you follow the exercise, range of motion and muscle strengthening exercises prescribed by your doctor. Safety measures are also important to protect the joint from further injury. Any time any of these exercises cause you to have increased pain or swelling, decrease what you are doing until you are comfortable again and then slowly increase them. If you have problems or questions, call your caregiver or physical therapist for advice.  ? ?Rehabilitation is important following a joint replacement. After just a few days of immobilization, the muscles of the leg can become weakened and shrink (atrophy).  These exercises are designed to build up the tone and strength of the thigh and leg muscles and to improve motion. Often times heat used for twenty to thirty minutes before working out will loosen up your tissues and help with improving the range of motion but do not use heat for the first two weeks following surgery (sometimes heat can increase post-operative swelling).  ? ?These exercises can be done on a training (exercise) mat, on the floor, on a table or on a bed. Use whatever works the best and is most comfortable for you.    Use music or television while you are exercising so that the exercises are a pleasant break in your day. This will make your life better with the exercises acting   as a break in your routine that you can look forward to.   Perform all exercises about fifteen times, three times per day or as directed.  You should exercise both the operative leg and the other leg as well. ? ?Exercises include: ?  ?Quad Sets - Tighten up the muscle on the front of the thigh (Quad) and hold for 5-10 seconds.   ?Straight Leg Raises - With your knee straight (if you were given a brace, keep it on), lift the leg to 60 degrees, hold for 3 seconds, and slowly lower the leg.  Perform this  exercise against resistance later as your leg gets stronger.  ?Leg Slides: Lying on your back, slowly slide your foot toward your buttocks, bending your knee up off the floor (only go as far as is comfortable). Then slowly slide your foot back down until your leg is flat on the floor again.  ?Angel Wings: Lying on your back spread your legs to the side as far apart as you can without causing discomfort.  ?Hamstring Strength:  Lying on your back, push your heel against the floor with your leg straight by tightening up the muscles of your buttocks.  Repeat, but this time bend your knee to a comfortable angle, and push your heel against the floor.  You may put a pillow under the heel to make it more comfortable if necessary.  ? ?A rehabilitation program following joint replacement surgery can speed recovery and prevent re-injury in the future due to weakened muscles. Contact your doctor or a physical therapist for more information on knee rehabilitation.  ? ? ?CONSTIPATION ? ?Constipation is defined medically as fewer than three stools per week and severe constipation as less than one stool per week.  Even if you have a regular bowel pattern at home, your normal regimen is likely to be disrupted due to multiple reasons following surgery.  Combination of anesthesia, postoperative narcotics, change in appetite and fluid intake all can affect your bowels.  ? ?YOU MUST use at least one of the following options; they are listed in order of increasing strength to get the job done.  They are all available over the counter, and you may need to use some, POSSIBLY even all of these options:   ? ?Drink plenty of fluids (prune juice may be helpful) and high fiber foods ?Colace 100 mg by mouth twice a day  ?Senokot for constipation as directed and as needed Dulcolax (bisacodyl), take with full glass of water  ?Miralax (polyethylene glycol) once or twice a day as needed. ? ?If you have tried all these things and are unable to have a  bowel movement in the first 3-4 days after surgery call either your surgeon or your primary doctor.   ? ?If you experience loose stools or diarrhea, hold the medications until you stool forms back up.  If your symptoms do not get better within 1 week or if they get worse, check with your doctor.  If you experience "the worst abdominal pain ever" or develop nausea or vomiting, please contact the office immediately for further recommendations for treatment. ? ? ?ITCHING:  If you experience itching with your medications, try taking only a single pain pill, or even half a pain pill at a time.  You can also use Benadryl over the counter for itching or also to help with sleep.  ? ?TED HOSE STOCKINGS:  Use stockings on both legs until for at least 2 weeks or as directed by  physician office. They may be removed at night for sleeping. ? ?MEDICATIONS:  See your medication summary on the ?After Visit Summary? that nursing will review with you.  You may have some home medications which will be placed on hold until you complete the course of blood thinner medication.  It is important for you to complete the blood thinner medication as prescribed. ? ?PRECAUTIONS:  If you experience chest pain or shortness of breath - call 911 immediately for transfer to the hospital emergency department.  ? ?If you develop a fever greater that 101 F, purulent drainage from wound, increased redness or drainage from wound, foul odor from the wound/dressing, or calf pain - CONTACT YOUR SURGEON.   ?                                                ?FOLLOW-UP APPOINTMENTS:  If you do not already have a post-op appointment, please call the office for an appointment to be seen by your surgeon.  Guidelines for how soon to be seen are listed in your ?After Visit Summary?, but are typically between 1-4 weeks after surgery. ? ?OTHER INSTRUCTIONS:  ? ?Knee Replacement:  Do not place pillow under knee, focus on keeping the knee straight while resting.   ? ?POST-OPERATIVE OPIOID TAPER INSTRUCTIONS: ?It is important to wean off of your opioid medication as soon as possible. If you do not need pain medication after your surgery it is ok to stop day one. ?Opioids i

## 2021-07-27 NOTE — Evaluation (Signed)
Physical Therapy Evaluation ?Patient Details ?Name: Brandon Ferrell ?MRN: [id number] ?DOB: 1955-09-12 ?Today's Date: 07/27/2021 ? ?History of Present Illness ? Pt is a 66yo male presenting s/p L-unicompartmental knee on 07/27/21. PMH: OA, COPD, GERD, HLD, HTN, R-THA 2016, L-THA 2017, L-TSA 2020 with biceps tenodesis in 2022, cervical fusion.  ?Clinical Impression ? Brandon Ferrell is a 66 y.o. male POD 0 s/p L-unicompartmental knee replacement. Patient reports indepdence with mobility at baseline. Patient is now limited by functional impairments (see PT problem list below) and requires min guard for transfers and gait with RW. Patient was able to ambulate 80 feet with RW and min guard assist and cues for safe walker management. Patient instructed in exercises to facilitate ROM and circulation and appropriate progression of exercises prior to OPPT. Patient will benefit from continued skilled PT interventions to address impairments and progress towards PLOF. Patient has met mobility goals at adequate level for discharge home; will continue to follow if pt continues acute stay to progress towards Mod I goals.   ?   ? ?Recommendations for follow up therapy are one component of a multi-disciplinary discharge planning process, led by the attending physician.  Recommendations may be updated based on patient status, additional functional criteria and insurance authorization. ? ?Follow Up Recommendations Follow physician's recommendations for discharge plan and follow up therapies ? ?  ?Assistance Recommended at Discharge Set up Supervision/Assistance  ?Patient can return home with the following ? A little help with walking and/or transfers;A little help with bathing/dressing/bathroom;Assistance with cooking/housework;Assist for transportation;Help with stairs or ramp for entrance ? ?  ?Equipment Recommendations None recommended by PT  ?Recommendations for Other Services ?    ?  ?Functional Status Assessment Patient has had a  recent decline in their functional status and demonstrates the ability to make significant improvements in function in a reasonable and predictable amount of time.  ? ?  ?Precautions / Restrictions Restrictions ?Weight Bearing Restrictions: No ?Other Position/Activity Restrictions: WBAT  ? ?  ? ?Mobility ? Bed Mobility ?Overal bed mobility: Needs Assistance ?Bed Mobility: Supine to Sit ?  ?  ?Supine to sit: Min guard ?  ?  ?General bed mobility comments: Pt min guard for safety only, no physical assist required ?  ? ?Transfers ?Overall transfer level: Needs assistance ?Equipment used: Rolling walker (2 wheels) ?Transfers: Sit to/from Stand ?Sit to Stand: Min assist ?  ?  ?  ?  ?  ?General transfer comment: Pt min assist for steadying of RW, min guard otherwise. VCs for sequencing and hand placement. Pt with slight forward lean but able to self correct. ?  ? ?Ambulation/Gait ?Ambulation/Gait assistance: Min guard ?Gait Distance (Feet): 80 Feet ?Assistive device: Rolling walker (2 wheels) ?Gait Pattern/deviations: Step-to pattern ?Gait velocity: decreased ?  ?  ?General Gait Details: Pt ambulated 56f with RW and min guard assist, no over LOB noted but pt did have a slight stumble to R during first few steps but was able to self-correct, likely still some numbness present. VCs for proximity to device and step-to pattern leading with LLE. ? ?Stairs ?  ?  ?  ?  ?  ? ?Wheelchair Mobility ?  ? ?Modified Rankin (Stroke Patients Only) ?  ? ?  ? ?Balance Overall balance assessment: Needs assistance ?Sitting-balance support: Feet unsupported, Single extremity supported ?Sitting balance-Leahy Scale: Fair ?  ?  ?Standing balance support: Reliant on assistive device for balance, During functional activity, Bilateral upper extremity supported ?Standing balance-Leahy Scale: Poor ?  ?  ?  ?  ?  ?  ?  ?  ?  ?  ?  ?  ?   ? ? ? ?  Pertinent Vitals/Pain Pain Assessment ?Pain Assessment: 0-10 ?Pain Score: 5  ?Pain Location: L knee ?Pain  Descriptors / Indicators: Operative site guarding, Discomfort, Grimacing ?Pain Intervention(s): Monitored during session, Repositioned, Ice applied, Patient requesting pain meds-RN notified  ? ? ?Home Living Family/patient expects to be discharged to:: Private residence ?Living Arrangements: Spouse/significant other ?Available Help at Discharge: Family;Available 24 hours/day ?Type of Home: House ?Home Access: Ramped entrance ?  ?  ?  ?Home Layout: One level ?Home Equipment: Conservation officer, nature (2 wheels);Cane - single point ?   ?  ?Prior Function Prior Level of Function : Independent/Modified Independent ?  ?  ?  ?  ?  ?  ?Mobility Comments: ind ?ADLs Comments: ind ?  ? ? ?Hand Dominance  ? Dominant Hand: Right ? ?  ?Extremity/Trunk Assessment  ? Upper Extremity Assessment ?Upper Extremity Assessment: Overall WFL for tasks assessed ?  ? ?Lower Extremity Assessment ?Lower Extremity Assessment: RLE deficits/detail;LLE deficits/detail ?RLE Deficits / Details: c ?RLE Sensation: WNL ?LLE Deficits / Details: MMT ank df/pf 5/5, no extensor lag noted ?LLE Sensation: WNL ?  ? ?Cervical / Trunk Assessment ?Cervical / Trunk Assessment: Neck Surgery  ?Communication  ? Communication: No difficulties  ?Cognition Arousal/Alertness: Awake/alert ?Behavior During Therapy: [facility] for tasks assessed/performed ?Overall Cognitive Status: Within Functional Limits for tasks assessed ?  ?  ?  ?  ?  ?  ?  ?  ?  ?  ?  ?  ?  ?  ?  ?  ?  ?  ?  ? ?  ?General Comments   ? ?  ?Exercises Total Joint Exercises ?Ankle Circles/Pumps: AROM, Both, 20 reps ?Quad Sets: AROM, Left, Other reps (comment) (2) ?Short Arc Quad: AROM, Left, Other reps (comment) (3) ?Heel Slides: AROM, Left, Other reps (comment) (3) ?Hip ABduction/ADduction: AROM, Left, Other reps (comment) (2) ?Straight Leg Raises: AROM, Left, Other reps (comment) (2) ?Long Arc Quad: Other (comment) (educated, not performed) ?Knee Flexion: Other (comment) (educated, not performed)  ? ?Assessment/Plan  ?   ?PT Assessment Patient needs continued PT services  ?PT Problem List Decreased strength;Decreased range of motion;Decreased activity tolerance;Decreased balance;Decreased mobility;Decreased coordination;Decreased knowledge of use of DME;Pain ? ?   ?  ?PT Treatment Interventions Gait training;Stair training;Functional mobility training;Therapeutic activities;Therapeutic exercise;Balance training;Neuromuscular re-education;Patient/family education;DME instruction   ? 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 ?  ?Frequency 7X/week ?  ? ? ?Co-evaluation   ?  ?  ?  ?  ? ? ?  ?AM-PAC PT "6 Clicks" Mobility  ?Outcome Measure Help needed turning from your back to your side while in a flat bed without using bedrails?: None ?Help needed moving from lying on your back to sitting on the side of a flat bed without using bedrails?: None ?Help needed moving to and from a bed to a chair (including a wheelchair)?: A Little ?Help needed standing up from a chair using your arms (e.g., wheelchair or bedside chair)?: A Little ?Help needed to walk in hospital room?: A Little ?Help needed climbing 3-5 steps with a railing? : A Little ?6 Click Score: 20 ? ?  ?End of Session Equipment Utilized During Treatment: Gait belt ?Activity Tolerance: Patient tolerated treatment well ?Patient left: in chair;with call bell/phone within reach;with family/visitor present ?Nurse Communication: Mobility status;Patient requests  pain meds ?PT Visit Diagnosis: Difficulty in walking, not elsewhere classified (R26.2) ?  ? ?Time: 9169-4503 ?PT Time Calculation (min) (ACUTE ONLY): 37 min ? ? ?Charges:   PT Evaluation ?$PT Eval Low Complexity: 1 Low ?PT Treatments ?$Gait Training: 8-22 mins ?  ?   ? ? ?Coolidge Breeze, PT, DPT ?[facility]  Rehabilitation Department ?Office: [phone] ?Pager: [phone] ? ?Coolidge Breeze ?07/27/2021, 1:10 PM ?

## 2021-07-27 NOTE — Anesthesia Postprocedure Evaluation (Signed)
Anesthesia Post Note ? ?Patient: Brandon Ferrell ? ?Procedure(s) Performed: UNICOMPARTMENTAL KNEE (Left: Knee) ? ?  ? ?Patient location during evaluation: PACU ?Anesthesia Type: Spinal ?Level of consciousness: awake, awake and alert and oriented ?Pain management: pain level controlled ?Vital Signs Assessment: post-procedure vital signs reviewed and stable ?Respiratory status: spontaneous breathing, nonlabored ventilation and respiratory function stable ?Cardiovascular status: blood pressure returned to baseline and stable ?Postop Assessment: no headache, no backache, spinal receding and no apparent nausea or vomiting ?Anesthetic complications: no ? ? ?No notable events documented. ? ?Last Vitals:  ?Vitals:  ? 07/27/21 1101 07/27/21 1110  ?BP: (!) 143/84 (!) 146/90  ?Pulse: 68 (!) 54  ?Resp: 13 15  ?Temp: 36.6 ?C   ?SpO2: 100% 97%  ?  ?Last Pain:  ?Vitals:  ? 07/27/21 1110  ?TempSrc:   ?PainSc: 0-No pain  ? ? ?  ?  ?  ?  ?  ?  ? ?Santa Lighter ? ? ? ? ?

## 2021-07-27 NOTE — Anesthesia Procedure Notes (Signed)
Anesthesia Regional Block: Adductor canal block  ? ?Pre-Anesthetic Checklist: , timeout performed,  Correct Patient, Correct Site, Correct Laterality,  Correct Procedure, Correct Position, site marked,  Risks and benefits discussed,  Surgical consent,  Pre-op evaluation,  At surgeon's request and post-op pain management ? ?Laterality: Left ? ?Prep: chloraprep     ?  ?Needles:  ?Injection technique: Single-shot ? ?Needle Type: Echogenic Needle   ? ? ?Needle Length: 9cm  ?Needle Gauge: 21  ? ? ? ?Additional Needles: ? ? ?Procedures:,,,, ultrasound used (permanent image in chart),,    ?Narrative:  ?Start time: 07/27/2021 6:43 AM ?End time: 07/27/2021 6:50 AM ?Injection made incrementally with aspirations every 5 mL. ? ?Performed by: Personally  ?Anesthesiologist: Santa Lighter, MD ? ?Additional Notes: ?No pain on injection. No increased resistance to injection. Injection made in 5cc increments.  Good needle visualization.  Patient tolerated procedure well. ? ? ? ? ?

## 2021-07-27 NOTE — Op Note (Signed)
07/27/2021 ? ?9:44 AM ? ?PATIENT:  Brandon Ferrell   ? ?PRE-OPERATIVE DIAGNOSIS: Left knee primary localized osteoarthritis ? ?POST-OPERATIVE DIAGNOSIS:  Same ? ?PROCEDURE:  Unicompartmental Knee Arthroplasty ? ?SURGEON:  [name], MD ? ?PHYSICIAN ASSISTANT: Merlene Pulling, PA-C, present and scrubbed throughout the case, critical for completion in a timely fashion, and for retraction, instrumentation, and closure. ? ?ANESTHESIA:   Spinal ? ?ESTIMATED BLOOD LOSS: 100 mL ? ?UNIQUE ASPECTS OF THE CASE: The medial compartment had advanced eburnation, and diffuse changes.  Interestingly, he sized to an XL, the larger was still not quite large enough to read create the articular cartilage thickness.  The lateral compartment was pristine, the patellofemoral joint was intact on the patella, although there was some changes on the lateral femoral trochlea, but the disease was really primarily medial, and his ACL was intact.  The medial femoral condylar bone was extremely strong, and I had to ream multiple times in order to get to the depth of the spigot's.  He was between a size 4 and a size 5, but the 5 felt too tight, and the 4 restored soft tissue tension appropriately with the final trials.  It snapped into place nicely at the completion of the case. ? ?PREOPERATIVE INDICATIONS:  Brandon Ferrell is a  66 y.o. male with a diagnosis of djd left knee who failed conservative measures and elected for surgical management.   ? ?The risks benefits and alternatives were discussed with the patient preoperatively including but not limited to the risks of infection, bleeding, nerve injury, cardiopulmonary complications, blood clots, the need for revision surgery, among others, and the patient was willing to proceed. ? ?OPERATIVE IMPLANTS: Biomet Oxford mobile bearing medial compartment arthroplasty femur size XL, tibia size D, bearing size 4. ? ?OPERATIVE FINDINGS: Endstage grade 4 medial compartment osteoarthritis. No  significant changes in the lateral or patellofemoral joint with the exception of some changes on the lateral femoral trochlea, but the patellar cartilage itself was well-maintained.  The ACL was intact. ? ?OPERATIVE PROCEDURE: The patient was brought to the operating room placed in the supine position. Anesthesia was administered. IV antibiotics were given. The lower extremity was placed in the legholder and prepped and draped in usual sterile fashion. ? ?Time out was performed. ? ?The leg was elevated and exsanguinated and the tourniquet was inflated. Anteromedial incision was performed, and I took care to preserve the MCL. Parapatellar incision was carried out, and the osteophytes were excised, along with the medial meniscus and a small portion of the fat pad. ? ?The extra medullary tibial cutting jig was applied, using the spoon and the 63m G-Clamp and the 2 mm shim, and I took care to protect the anterior cruciate ligament insertion and the tibial spine. The medial collateral ligament was also protected, and I resected my proximal tibia, matching the anatomic slope.  ? ?The proximal tibial bony cut was removed in one piece, and I turned my attention to the femur. ? ?The intramedullary femoral rod was placed using the drill, and then using the appropriate reference, I assembled the femoral jig, setting my posterior cutting block. I resected my posterior femur, used the 0 spigot for the anterior femur, and then measured my gap.  ? ?I then used the appropriate mill to match the extension gap to the flexion gap. The second milling was at a 3 and then again at 4.  The gaps were then measured again with the appropriate feeler gauges. Once I had  balanced flexion and extension gaps, I then completed the preparation of the femur. ? ?I milled off the anterior aspect of the distal femur to prevent impingement. I also exposed the tibia, and selected the above-named component, and then used the cutting jig to prepare the  keel slot on the tibia. I also used the awl to curette out the bone to complete the preparation of the keel. The back wall was intact. ? ?I then placed trial components, and it was found to have excellent motion, and appropriate balance. ? ?I then cemented the components into place, cementing the tibia first, removing all excess cement, and then cementing the femur.  All loose cement was removed. ? ?The real polyethylene insert was applied manually, and the knee was taken through functional range of motion, and found to have excellent stability and restoration of joint motion, with excellent balance. ? ?The wounds were irrigated copiously, and the parapatellar tissue closed with Vicryl, followed by Vicryl for the subcutaneous tissue, with routine closure with Steri-Strips and sterile gauze. ? ?The tourniquet was released, and the patient was awakened and extubated and returned to PACU in stable and satisfactory condition. There were no complications. ? ?

## 2021-07-28 ENCOUNTER — Encounter (HOSPITAL_COMMUNITY): Payer: Self-pay | Admitting: Orthopedic Surgery

## 2021-07-29 ENCOUNTER — Ambulatory Visit: Payer: Medicare Other | Attending: Orthopedic Surgery | Admitting: Physical Therapy

## 2021-07-29 ENCOUNTER — Encounter: Payer: Self-pay | Admitting: Physical Therapy

## 2021-07-29 DIAGNOSIS — M6281 Muscle weakness (generalized): Secondary | ICD-10-CM | POA: Insufficient documentation

## 2021-07-29 DIAGNOSIS — R600 Localized edema: Secondary | ICD-10-CM | POA: Diagnosis not present

## 2021-07-29 DIAGNOSIS — M25562 Pain in left knee: Secondary | ICD-10-CM | POA: Insufficient documentation

## 2021-07-29 DIAGNOSIS — M25662 Stiffness of left knee, not elsewhere classified: Secondary | ICD-10-CM | POA: Insufficient documentation

## 2021-07-29 DIAGNOSIS — G8929 Other chronic pain: Secondary | ICD-10-CM | POA: Insufficient documentation

## 2021-07-29 NOTE — Therapy (Signed)
[place] ?[facility] ?[address] ?[place], [place], [address] ?Phone: [phone]   Fax:  [phone] ? ?Physical Therapy Evaluation ? ?Patient Details  ?Name: Brandon Ferrell ?MRN: [id number] ?Date of Birth: October 07, 1955 ?Referring Provider (PT): Marchia Bond MD ? ? ?Encounter Date: 07/29/2021 ? ? PT End of Session - 07/29/21 1211   ? ? Visit Number 1   ? Number of Visits 12   ? Date for PT Re-Evaluation 08/26/21   ? Authorization Type FOTO AT LEAST EVERY 5TH VISIT.  PROGRESS NOTE AT 10TH VISIT.  KX MODIFIER AFTER 15 VISITS.   ? PT Start Time 1038   ? PT Stop Time 1119   ? PT Time Calculation (min) 41 min   ? Activity Tolerance Patient tolerated treatment well   ? Behavior During Therapy [facility] for tasks assessed/performed   ? ?  ?  ? ?  ? ? ?Past Medical History:  ?Diagnosis Date  ? Acute renal insufficiency 07/18/2015  ? Acute urinary retention 07/18/2015  ? Arthritis   ? Asthma   ? as a child  ? COPD (chronic obstructive pulmonary disease) ([place])   ? GERD (gastroesophageal reflux disease)   ? occ zantac  ? Hx of adenomatous colonic polyps 08/30/2017  ? Hyperlipidemia   ? Hypertension   ? Palpitations   ? Pneumonia 2017  ? hx  ? Primary localized osteoarthrosis of left shoulder 12/04/2018  ? ? ?Past Surgical History:  ?Procedure Laterality Date  ? BICEPT TENODESIS Left 08/13/2020  ? Procedure: OPEN BICEPS TENODESIS;  Surgeon: Marchia Bond, MD;  Location: [place];  Service: Orthopedics;  Laterality: Left;  ? CERVICAL FUSION    ? GROIN EXPLORATION Right 07/03/15  ? cyst removed- local anesthesia morehead  ? LUNG BIOPSY  13  ? pneum, dx copd  ? PARTIAL KNEE ARTHROPLASTY Left 07/27/2021  ? Procedure: UNICOMPARTMENTAL KNEE;  Surgeon: Marchia Bond, MD;  [facility];  Service: Orthopedics;  Laterality: Left;  ? TOTAL HIP ARTHROPLASTY Right 11/07/2014  ? Procedure: RIGHT  TOTAL HIP ARTHROPLASTY;  Surgeon: Earlie Server, MD;  Location: [place];  Service: Orthopedics;   Laterality: Right;  Would like to flip if possible ? ?  ? TOTAL HIP ARTHROPLASTY Left 07/17/2015  ? Procedure: LEFT TOTAL HIP ARTHROPLASTY;  Surgeon: Earlie Server, MD;  Location: [place];  Service: Orthopedics;  Laterality: Left;  ? TOTAL SHOULDER ARTHROPLASTY Left 12/04/2018  ? Procedure: TOTAL SHOULDER ARTHROPLASTY;  Surgeon: Marchia Bond, MD;  [facility];  Service: Orthopedics;  Laterality: Left;  ? VASECTOMY  5/15  ? ? ?There were no vitals filed for this visit. ? ? ? Subjective Assessment - 07/29/21 1213   ? ? Subjective The patient presents to the clinic today s/p left partial knee replacement performed on 07/27/21.  He is in a lot of pain as his block wore off and he didn't take his pain medication this morning.  He is compliant to a HEP.  His post-surgical dressing is intact and he is wearing TED hose and a knee immobilizer when up on his FWW.   ? Pertinent History Bilateral TKA's, HTN, left biceps tenodesis, cervical fusion, left shoulder arthroplasty.   ? How long can you walk comfortably? With FWW around home.   ? Patient Stated Goals Get out of pain and back to life.   ? Pain Score 10-Worst pain ever   ? Pain Location Knee   ? Pain Orientation Left   ? Pain Descriptors / Indicators  Aching;Throbbing;Sharp   ? Pain Type Chronic pain   ? ?  ?  ? ?  ? ? ? ? ? [facility] PT Assessment - 07/29/21 0001   ? ?  ? Assessment  ? Medical Diagnosis Left partial knee replacement   ? Referring Provider (PT) Marchia Bond MD   ? Onset Date/Surgical Date --   07/27/21 (surgery date).  ?  ? Precautions  ? Precaution Comments No ultrasound.   ?  ? Restrictions  ? Weight Bearing Restrictions No   ?  ? Balance Screen  ? Has the patient fallen in the past 6 months No   ? Has the patient had a decrease in activity level because of a fear of falling?  Yes   ? Is the patient reluctant to leave their home because of a fear of falling?  No   ?  ? Home Environment  ? Living Environment Private residence   ?  ? Prior Function  ? Level  of Independence Independent   ?  ? Observation/Other Assessments  ? Focus on Therapeutic Outcomes (FOTO)  Complete.   ?  ? Observation/Other Assessments-Edema   ? Edema Circumferential   LT 5 cms > RT.  ?  ? ROM / Strength  ? AROM / PROM / Strength AROM;Strength   ?  ? AROM  ? Overall AROM Comments Left knee extension is supine -10 degrees and passive to -5 degrees with flexion to 70 degrees today.   ?  ? Strength  ? Overall Strength Comments Left hip flexion and knee extension currently 3-/5.   ?  ? Palpation  ? Palpation comment C/O diffuse left knee pain currently   ?  ? Bed Mobility  ? Bed Mobility --   Assistance required to left LE.  ?  ? Ambulation/Gait  ? Gait Comments Step-to gait pattern with a FWW.   ? ?  ?  ? ?  ? ? ? ? ? ? ? ? ? ? ? ? ? ?Objective measurements completed on examination: See above findings.  ? ? ? ? ? ? ? ? ? ? ? ? ? ? ? ? ? ? ? PT Long Term Goals - 07/29/21 1233   ? ?  ? PT LONG TERM GOAL #1  ? Title Independent with a HEP.   ? Time 4   ? Period Weeks   ? Status New   ?  ? PT LONG TERM GOAL #2  ? Title Active left knee flexion to 115 degrees+ so the patient can perform functional tasks and do so with pain not > 2-3/10.   ? Time 4   ? Period Weeks   ? Status New   ?  ? PT LONG TERM GOAL #3  ? Title Increase left hip and knee strength to a solid 4+/5 to provide good stability for accomplishment of functional activities   ? Time 4   ? Period Weeks   ? Status New   ?  ? PT LONG TERM GOAL #4  ? Title Full active knee extension in order to normalize gait.   ? Time 4   ? Period Weeks   ? Status New   ?  ? PT LONG TERM GOAL #5  ? Title Perform a reciprocating stair gait with one railing with pain not > 2-3/10.   ? Time 4   ? Period Weeks   ? Status New   ? ?  ?  ? ?  ? ? ? ? ? ? ? ? ?  Plan - 07/29/21 1225   ? ? Clinical Impression Statement The patient presents to [facility] s/p left partial knee replacement performed on 07/27/21.  He is in a lot of pain this morning as he didn't take his pain  medication.  He is walking with a FWW.  He has an expected loss of range of motion and strength currently.  He has moderate edema.  He is doing an HEP.  He tried the Nustep for range of motion at level 1 but tolerated for ~4 minutes due to pain.  Vasopneumatic on low was also only tolerated for about 4 to 5 minutes due to pain.  He is very motivated to improve and  states he will definitely be taking his pain medication as prescribed.    Patient will benefit from skilled physical therapy intervention to address pain and deficits.   ? Personal Factors and Comorbidities Comorbidity 1;Comorbidity 2;Other   ? Comorbidities Bilateral TKA's, HTN, left biceps tenodesis, cervical fusion, left shoulder arthroplasty.   ? Examination-Activity Limitations Other;Transfers;Locomotion Level;Bathing;Stairs   ? Examination-Participation Restrictions Meal Prep;Other;Cleaning;Valla Leaver Work   ? Stability/Clinical Decision Making Stable/Uncomplicated   ? Clinical Decision Making Low   ? Rehab Potential Excellent   ? PT Frequency 3x / week   ? PT Duration 4 weeks   ? PT Treatment/Interventions ADLs/Self Care Home Management;Cryotherapy;Electrical Stimulation;Gait training;Stair training;Functional mobility training;Therapeutic activities;Therapeutic exercise;Neuromuscular re-education;Manual techniques;Patient/family education;Passive range of motion;Vasopneumatic Device   ? PT Next Visit Plan Nustep with progression to recumbent bike, PROM.  Progress into TKA protocol.  Vasopneumatic and electrical stimulation.   ? Consulted and Agree with Plan of Care Patient   ? ?  ?  ? ?  ? ? ?Patient will benefit from skilled therapeutic intervention in order to improve the following deficits and impairments:  Pain, Abnormal gait, Difficulty walking, Decreased activity tolerance, Decreased mobility, Decreased range of motion, Decreased strength, Increased edema ? ?Visit Diagnosis: ?Chronic pain of left knee - Plan: PT plan of care  cert/re-cert ? ?Stiffness of left knee, not elsewhere classified - Plan: PT plan of care cert/re-cert ? ?Localized edema - Plan: PT plan of care cert/re-cert ? ?Muscle weakness (generalized) - Plan: PT plan of care cert/re-cert ? ? ? ? ?P

## 2021-08-02 ENCOUNTER — Ambulatory Visit: Payer: Medicare Other | Attending: Orthopedic Surgery

## 2021-08-02 DIAGNOSIS — M6281 Muscle weakness (generalized): Secondary | ICD-10-CM | POA: Insufficient documentation

## 2021-08-02 DIAGNOSIS — M25562 Pain in left knee: Secondary | ICD-10-CM | POA: Diagnosis not present

## 2021-08-02 DIAGNOSIS — R600 Localized edema: Secondary | ICD-10-CM | POA: Insufficient documentation

## 2021-08-02 DIAGNOSIS — G8929 Other chronic pain: Secondary | ICD-10-CM | POA: Insufficient documentation

## 2021-08-02 DIAGNOSIS — M25662 Stiffness of left knee, not elsewhere classified: Secondary | ICD-10-CM | POA: Insufficient documentation

## 2021-08-02 NOTE — Therapy (Signed)
[place] ?[facility] ?[address] ?[place], [place], [address] ?Phone: [phone]   Fax:  [phone] ? ?Physical Therapy Treatment ? ?Patient Details  ?Name: Brandon Ferrell ?MRN: [id number] ?Date of Birth: November 26, 1955 ?Referring Provider (PT): Marchia Bond MD ? ? ?Encounter Date: 08/02/2021 ? ? PT End of Session - 08/02/21 0909   ? ? Visit Number 2   ? Number of Visits 12   ? Date for PT Re-Evaluation 08/26/21   ? Authorization Type FOTO AT LEAST EVERY 5TH VISIT.  PROGRESS NOTE AT 10TH VISIT.  KX MODIFIER AFTER 15 VISITS.   ? PT Start Time 0900   ? PT Stop Time [phone]   ? PT Time Calculation (min) 58 min   ? Activity Tolerance Patient tolerated treatment well   ? Behavior During Therapy [facility] for tasks assessed/performed   ? ?  ?  ? ?  ? ? ?Past Medical History:  ?Diagnosis Date  ? Acute renal insufficiency 07/18/2015  ? Acute urinary retention 07/18/2015  ? Arthritis   ? Asthma   ? as a child  ? COPD (chronic obstructive pulmonary disease) ([place])   ? GERD (gastroesophageal reflux disease)   ? occ zantac  ? Hx of adenomatous colonic polyps 08/30/2017  ? Hyperlipidemia   ? Hypertension   ? Palpitations   ? Pneumonia 2017  ? hx  ? Primary localized osteoarthrosis of left shoulder 12/04/2018  ? ? ?Past Surgical History:  ?Procedure Laterality Date  ? BICEPT TENODESIS Left 08/13/2020  ? Procedure: OPEN BICEPS TENODESIS;  Surgeon: Marchia Bond, MD;  Location: [place];  Service: Orthopedics;  Laterality: Left;  ? CERVICAL FUSION    ? GROIN EXPLORATION Right 07/03/15  ? cyst removed- local anesthesia morehead  ? LUNG BIOPSY  13  ? pneum, dx copd  ? PARTIAL KNEE ARTHROPLASTY Left 07/27/2021  ? Procedure: UNICOMPARTMENTAL KNEE;  Surgeon: Marchia Bond, MD;  [facility];  Service: Orthopedics;  Laterality: Left;  ? TOTAL HIP ARTHROPLASTY Right 11/07/2014  ? Procedure: RIGHT  TOTAL HIP ARTHROPLASTY;  Surgeon: Earlie Server, MD;  Location: [place];  Service: Orthopedics;   Laterality: Right;  Would like to flip if possible ? ?  ? TOTAL HIP ARTHROPLASTY Left 07/17/2015  ? Procedure: LEFT TOTAL HIP ARTHROPLASTY;  Surgeon: Earlie Server, MD;  Location: [place];  Service: Orthopedics;  Laterality: Left;  ? TOTAL SHOULDER ARTHROPLASTY Left 12/04/2018  ? Procedure: TOTAL SHOULDER ARTHROPLASTY;  Surgeon: Marchia Bond, MD;  [facility];  Service: Orthopedics;  Laterality: Left;  ? VASECTOMY  5/15  ? ? ?There were no vitals filed for this visit. ? ? Subjective Assessment - 08/02/21 0908   ? ? Subjective Pt arrives for today's treatment session reporting 8/10 left knee pain.  Pt states that he has been working on his ROM at home.   ? Pertinent History Bilateral TKA's, HTN, left biceps tenodesis, cervical fusion, left shoulder arthroplasty.   ? How long can you walk comfortably? With FWW around home.   ? Patient Stated Goals Get out of pain and back to life.   ? Currently in Pain? Yes   ? Pain Score 8    ? Pain Location Knee   ? Pain Orientation Left   ? ?  ?  ? ?  ? ? ? ? ? ? ? ? ? ? ? ? ? ? ? ? ? ? ? ? [facility] Adult PT Treatment/Exercise - 08/02/21 0001   ? ?  ?  Exercises  ? Exercises Knee/Hip   ?  ? Knee/Hip Exercises: Aerobic  ? Nustep Lvl 3 x 15 mins   ?  ? Knee/Hip Exercises: Supine  ? Quad Sets Left;15 reps   3 sec hold  ? Heel Slides Left;15 reps   ? Hip Adduction Isometric Both;20 reps   ? Straight Leg Raises Left;15 reps   ? Other Supine Knee/Hip Exercises Clam shells x 20 reps red tband   ?  ? Modalities  ? Modalities Electrical Stimulation;Vasopneumatic   ?  ? Electrical Stimulation  ? Electrical Stimulation Location Left knee   ? Electrical Stimulation Action IFC   ? Electrical Stimulation Parameters 80-150 Hz x 15 mins   ? Electrical Stimulation Goals Pain   ?  ? Vasopneumatic  ? Number Minutes Vasopneumatic  15 minutes   ? Vasopnuematic Location  Knee   ? Vasopneumatic Pressure Low   ? Vasopneumatic Temperature  34   ? ?  ?  ? ?  ? ? ? ? ? ? ? ? ? ? ? ? ? ? ? PT Long Term Goals -  07/29/21 1233   ? ?  ? PT LONG TERM GOAL #1  ? Title Independent with a HEP.   ? Time 4   ? Period Weeks   ? Status New   ?  ? PT LONG TERM GOAL #2  ? Title Active left knee flexion to 115 degrees+ so the patient can perform functional tasks and do so with pain not > 2-3/10.   ? Time 4   ? Period Weeks   ? Status New   ?  ? PT LONG TERM GOAL #3  ? Title Increase left hip and knee strength to a solid 4+/5 to provide good stability for accomplishment of functional activities   ? Time 4   ? Period Weeks   ? Status New   ?  ? PT LONG TERM GOAL #4  ? Title Full active knee extension in order to normalize gait.   ? Time 4   ? Period Weeks   ? Status New   ?  ? PT LONG TERM GOAL #5  ? Title Perform a reciprocating stair gait with one railing with pain not > 2-3/10.   ? Time 4   ? Period Weeks   ? Status New   ? ?  ?  ? ?  ? ? ? ? ? ? ? ? Plan - 08/02/21 0910   ? ? Clinical Impression Statement Pt arrives for today's treatment session reproting 8/10 left knee pain.  Pt ambulates into the facility with [facility] today.  Pt reports working on ROM over the weekend at home.  Pt able to tolerate Nustep for warm-up to increase ROM, activity tolerance, and endurance.  Pt instructed in numerous supine left LE exercises to increase strength and function.  Pt requiring min cues for proper technique and pacing.  Normal responses to vaso and estim noted.  Pt reported 5/10 left knee pain at completion of today's treatment session.   ? Personal Factors and Comorbidities Comorbidity 1;Comorbidity 2;Other   ? Comorbidities Bilateral TKA's, HTN, left biceps tenodesis, cervical fusion, left shoulder arthroplasty.   ? Examination-Activity Limitations Other;Transfers;Locomotion Level;Bathing;Stairs   ? Examination-Participation Restrictions Meal Prep;Other;Cleaning;Valla Leaver Work   ? Stability/Clinical Decision Making Stable/Uncomplicated   ? Rehab Potential Excellent   ? PT Frequency 3x / week   ? PT Duration 4 weeks   ? PT Treatment/Interventions  ADLs/Self Care Home Management;Cryotherapy;Electrical Stimulation;Gait  training;Stair training;Functional mobility training;Therapeutic activities;Therapeutic exercise;Neuromuscular re-education;Manual techniques;Patient/family education;Passive range of motion;Vasopneumatic Device   ? PT Next Visit Plan Nustep with progression to recumbent bike, PROM.  Progress into TKA protocol.  Vasopneumatic and electrical stimulation.   ? Consulted and Agree with Plan of Care Patient   ? ?  ?  ? ?  ? ? ?Patient will benefit from skilled therapeutic intervention in order to improve the following deficits and impairments:  Pain, Abnormal gait, Difficulty walking, Decreased activity tolerance, Decreased mobility, Decreased range of motion, Decreased strength, Increased edema ? ?Visit Diagnosis: ?Chronic pain of left knee ? ?Stiffness of left knee, not elsewhere classified ? ?Localized edema ? ?Muscle weakness (generalized) ? ? ? ? ?Problem List ?Patient Active Problem List  ? Diagnosis Date Noted  ? Osteoarthritis of left knee 07/14/2021  ? Primary localized osteoarthrosis of left shoulder 12/04/2018  ? Localized primary osteoarthritis of left shoulder region 12/04/2018  ? Hx of adenomatous colonic polyps 08/30/2017  ? Degenerative joint disease of left hip 07/17/2015  ? Primary localized osteoarthritis of right hip 11/07/2014  ? Hyperlipidemia 06/21/2006  ? TOBACCO ABUSE 06/21/2006  ? PRESBYOPIA 06/21/2006  ? Essential hypertension 06/21/2006  ? INSOMNIA 06/21/2006  ? ? ?Kathrynn Ducking, PTA ?08/02/2021, 9:59 AM ? ?[place] ?[facility] ?[address] ?[place], [place], [address] ?Phone: [phone]   Fax:  [phone] ? ?Name: Brandon Ferrell ?MRN: [id number] ?Date of Birth: [birth year] ? ? ? ?

## 2021-08-06 ENCOUNTER — Ambulatory Visit: Payer: Medicare Other | Admitting: *Deleted

## 2021-08-06 DIAGNOSIS — M6281 Muscle weakness (generalized): Secondary | ICD-10-CM | POA: Diagnosis not present

## 2021-08-06 DIAGNOSIS — M25662 Stiffness of left knee, not elsewhere classified: Secondary | ICD-10-CM

## 2021-08-06 DIAGNOSIS — G8929 Other chronic pain: Secondary | ICD-10-CM | POA: Diagnosis not present

## 2021-08-06 DIAGNOSIS — M25562 Pain in left knee: Secondary | ICD-10-CM | POA: Diagnosis not present

## 2021-08-06 DIAGNOSIS — R600 Localized edema: Secondary | ICD-10-CM

## 2021-08-06 NOTE — Therapy (Signed)
[facility] ?[place] ?[place], [place], [address] ?Phone: [phone]   Fax:  [phone] ? ?Physical Therapy Treatment ? ?Patient Details  ?Name: Brandon Ferrell ?MRN: [id number] ?Date of Birth: 1955/12/10 ?Referring Provider (PT): Marchia Bond MD ? ? ?Encounter Date: 08/06/2021 ? ? PT End of Session - 08/06/21 3016   ? ? Visit Number 3   ? Number of Visits 12   ? Date for PT Re-Evaluation 08/26/21   ? Authorization Type FOTO AT LEAST EVERY 5TH VISIT.  PROGRESS NOTE AT 10TH VISIT.  KX MODIFIER AFTER 15 VISITS.   ? PT Start Time [phone]   ? PT Stop Time 1010   ? PT Time Calculation (min) 56 min   ? ?  ?  ? ?  ? ? ?Past Medical History:  ?Diagnosis Date  ? Acute renal insufficiency 07/18/2015  ? Acute urinary retention 07/18/2015  ? Arthritis   ? Asthma   ? as a child  ? COPD (chronic obstructive pulmonary disease) ([facility])   ? GERD (gastroesophageal reflux disease)   ? occ zantac  ? Hx of adenomatous colonic polyps 08/30/2017  ? Hyperlipidemia   ? Hypertension   ? Palpitations   ? Pneumonia 2017  ? hx  ? Primary localized osteoarthrosis of left shoulder 12/04/2018  ? ? ?Past Surgical History:  ?Procedure Laterality Date  ? BICEPT TENODESIS Left 08/13/2020  ? Procedure: OPEN BICEPS TENODESIS;  Surgeon: Marchia Bond, MD;  Location: [facility];  Service: Orthopedics;  Laterality: Left;  ? CERVICAL FUSION    ? GROIN EXPLORATION Right 07/03/15  ? cyst removed- local anesthesia morehead  ? LUNG BIOPSY  13  ? pneum, dx copd  ? PARTIAL KNEE ARTHROPLASTY Left 07/27/2021  ? Procedure: UNICOMPARTMENTAL KNEE;  Surgeon: Marchia Bond, MD;  Location: [facility];  Service: Orthopedics;  Laterality: Left;  ? TOTAL HIP ARTHROPLASTY Right 11/07/2014  ? Procedure: RIGHT  TOTAL HIP ARTHROPLASTY;  Surgeon: Earlie Server, MD;  Location: [facility];  Service: Orthopedics;  Laterality: Right;  Would like to flip if possible ? ?  ? TOTAL HIP ARTHROPLASTY Left 07/17/2015  ? Procedure: LEFT TOTAL HIP  ARTHROPLASTY;  Surgeon: Earlie Server, MD;  Location: [facility];  Service: Orthopedics;  Laterality: Left;  ? TOTAL SHOULDER ARTHROPLASTY Left 12/04/2018  ? Procedure: TOTAL SHOULDER ARTHROPLASTY;  Surgeon: Marchia Bond, MD;  [facility];  Service: Orthopedics;  Laterality: Left;  ? VASECTOMY  5/15  ? ? ?There were no vitals filed for this visit. ? ? Subjective Assessment - 08/06/21 0922   ? ? Subjective Pt arrives for today's treatment session reporting 8/10 left knee pain. HEP going well. To MD on Monday   ? Pertinent History Bilateral TKA's, HTN, left biceps tenodesis, cervical fusion, left shoulder arthroplasty.   ? How long can you walk comfortably? With FWW around home.   ? Patient Stated Goals Get out of pain and back to life.   ? Currently in Pain? Yes   ? Pain Score 8    ? Pain Location Knee   ? Pain Orientation Left   ? Pain Descriptors / Indicators Aching;Throbbing   ? Pain Type Chronic pain   ? ?  ?  ? ?  ? ? ? ? ? ? ? ? ? ? ? ? ? ? ? ? ? ? ? ? [facility] Adult PT Treatment/Exercise - 08/06/21 0001   ? ?  ? Exercises  ? Exercises Knee/Hip   ?  ?  Knee/Hip Exercises: Aerobic  ? Nustep Lvl 3 x 15 mins   ?  ? Knee/Hip Exercises: Standing  ? Heel Raises Both;2 sets;15 reps   ? Heel Raises Limitations toe ups 2x15   ? Lateral Step Up Left;2 sets;10 reps;Step Height: 4";Hand Hold: 2   ? Forward Step Up Left;2 sets;Hand Hold: 2;Step Height: 4";10 reps   ? Rocker Board 3 minutes   ?  ? Modalities  ? Modalities Electrical Stimulation;Vasopneumatic   ?  ? Electrical Stimulation  ? Electrical Stimulation Location Left knee   ? Electrical Stimulation Action IFC   ? Electrical Stimulation Parameters 80-'150hz'$  x 15 mins   ? Electrical Stimulation Goals Pain   ?  ? Vasopneumatic  ? Number Minutes Vasopneumatic  15 minutes   ? Vasopnuematic Location  Knee   ? Vasopneumatic Pressure Low   ? Vasopneumatic Temperature  34   ?  ? Manual Therapy  ? Manual therapy comments PROM to LT knee 0 to102 degrees   ? ?  ?  ? ?   ? ? ? ? ? ? ? ? ? ? ? ? ? ? ? PT Long Term Goals - 07/29/21 1233   ? ?  ? PT LONG TERM GOAL #1  ? Title Independent with a HEP.   ? Time 4   ? Period Weeks   ? Status New   ?  ? PT LONG TERM GOAL #2  ? Title Active left knee flexion to 115 degrees+ so the patient can perform functional tasks and do so with pain not > 2-3/10.   ? Time 4   ? Period Weeks   ? Status New   ?  ? PT LONG TERM GOAL #3  ? Title Increase left hip and knee strength to a solid 4+/5 to provide good stability for accomplishment of functional activities   ? Time 4   ? Period Weeks   ? Status New   ?  ? PT LONG TERM GOAL #4  ? Title Full active knee extension in order to normalize gait.   ? Time 4   ? Period Weeks   ? Status New   ?  ? PT LONG TERM GOAL #5  ? Title Perform a reciprocating stair gait with one railing with pain not > 2-3/10.   ? Time 4   ? Period Weeks   ? Status New   ? ?  ?  ? ?  ? ? ? ? ? ? ? ? Plan - 08/06/21 0924   ? ? Clinical Impression Statement Pt 14 mins late.Rx focused on ROM as well as LT LE strengthening and balance. He was able to perform standing exs today and did well. PROM 0-102 degrees.   ? Personal Factors and Comorbidities Comorbidity 1;Comorbidity 2;Other   ? Comorbidities Bilateral TKA's, HTN, left biceps tenodesis, cervical fusion, left shoulder arthroplasty.   ? Examination-Activity Limitations Other;Transfers;Locomotion Level;Bathing;Stairs   ? Examination-Participation Restrictions Meal Prep;Other;Cleaning;Valla Leaver Work   ? Stability/Clinical Decision Making Stable/Uncomplicated   ? Rehab Potential Excellent   ? PT Frequency 3x / week   ? PT Duration 4 weeks   ? PT Treatment/Interventions ADLs/Self Care Home Management;Cryotherapy;Electrical Stimulation;Gait training;Stair training;Functional mobility training;Therapeutic activities;Therapeutic exercise;Neuromuscular re-education;Manual techniques;Patient/family education;Passive range of motion;Vasopneumatic Device   ? PT Next Visit Plan Nustep with  progression to recumbent bike, PROM.  Progress into TKA protocol.  Vasopneumatic and electrical stimulation.   ? Consulted and Agree with Plan of Care Patient   ? ?  ?  ? ?  ? ? ?  Patient will benefit from skilled therapeutic intervention in order to improve the following deficits and impairments:  Pain, Abnormal gait, Difficulty walking, Decreased activity tolerance, Decreased mobility, Decreased range of motion, Decreased strength, Increased edema ? ?Visit Diagnosis: ?Chronic pain of left knee ? ?Stiffness of left knee, not elsewhere classified ? ?Localized edema ? ?Muscle weakness (generalized) ? ? ? ? ?Problem List ?Patient Active Problem List  ? Diagnosis Date Noted  ? Osteoarthritis of left knee 07/14/2021  ? Primary localized osteoarthrosis of left shoulder 12/04/2018  ? Localized primary osteoarthritis of left shoulder region 12/04/2018  ? Hx of adenomatous colonic polyps 08/30/2017  ? Degenerative joint disease of left hip 07/17/2015  ? Primary localized osteoarthritis of right hip 11/07/2014  ? Hyperlipidemia 06/21/2006  ? TOBACCO ABUSE 06/21/2006  ? PRESBYOPIA 06/21/2006  ? Essential hypertension 06/21/2006  ? INSOMNIA 06/21/2006  ? ? ?Kaelyn Innocent,CHRIS, PTA ?08/06/2021, 11:25 AM ? ?[facility] ?[place] ?[place], [place], [address] ?Phone: [phone]   Fax:  [phone] ? ?Name: ORELL HURTADO ?MRN: [id number] ?Date of Birth: [birth year] ? ? ? ?

## 2021-08-09 DIAGNOSIS — M1712 Unilateral primary osteoarthritis, left knee: Secondary | ICD-10-CM | POA: Diagnosis not present

## 2021-08-10 DIAGNOSIS — M25562 Pain in left knee: Secondary | ICD-10-CM | POA: Diagnosis not present

## 2021-08-10 DIAGNOSIS — L821 Other seborrheic keratosis: Secondary | ICD-10-CM | POA: Diagnosis not present

## 2021-08-10 DIAGNOSIS — J449 Chronic obstructive pulmonary disease, unspecified: Secondary | ICD-10-CM | POA: Diagnosis not present

## 2021-08-10 DIAGNOSIS — F17210 Nicotine dependence, cigarettes, uncomplicated: Secondary | ICD-10-CM | POA: Diagnosis not present

## 2021-08-10 DIAGNOSIS — G8929 Other chronic pain: Secondary | ICD-10-CM | POA: Diagnosis not present

## 2021-08-10 DIAGNOSIS — Z79899 Other long term (current) drug therapy: Secondary | ICD-10-CM | POA: Diagnosis not present

## 2021-08-11 ENCOUNTER — Ambulatory Visit: Payer: Medicare Other

## 2021-08-13 ENCOUNTER — Ambulatory Visit: Payer: Medicare Other | Admitting: *Deleted

## 2021-08-13 DIAGNOSIS — M25562 Pain in left knee: Secondary | ICD-10-CM | POA: Diagnosis not present

## 2021-08-13 DIAGNOSIS — R600 Localized edema: Secondary | ICD-10-CM

## 2021-08-13 DIAGNOSIS — G8929 Other chronic pain: Secondary | ICD-10-CM | POA: Diagnosis not present

## 2021-08-13 DIAGNOSIS — M25662 Stiffness of left knee, not elsewhere classified: Secondary | ICD-10-CM | POA: Diagnosis not present

## 2021-08-13 DIAGNOSIS — M6281 Muscle weakness (generalized): Secondary | ICD-10-CM | POA: Diagnosis not present

## 2021-08-13 NOTE — Therapy (Signed)
[facility] ?[place] ?[place], [place], [address] ?Phone: [phone]   Fax:  [phone] ? ?Physical Therapy Treatment ? ?Patient Details  ?Name: Brandon Ferrell ?MRN: [id number] ?Date of Birth: 12-Jan-1956 ?Referring Provider (PT): Marchia Bond MD ? ? ?Encounter Date: 08/13/2021 ? ? PT End of Session - 08/13/21 1004   ? ? Visit Number 4   ? Number of Visits 12   ? Date for PT Re-Evaluation 08/26/21   ? Authorization Type FOTO AT LEAST EVERY 5TH VISIT.  PROGRESS NOTE AT 10TH VISIT.  KX MODIFIER AFTER 15 VISITS.   ? PT Start Time 5400   ? PT Stop Time 1041   ? PT Time Calculation (min) 56 min   ? ?  ?  ? ?  ? ? ?Past Medical History:  ?Diagnosis Date  ? Acute renal insufficiency 07/18/2015  ? Acute urinary retention 07/18/2015  ? Arthritis   ? Asthma   ? as a child  ? COPD (chronic obstructive pulmonary disease) ([facility])   ? GERD (gastroesophageal reflux disease)   ? occ zantac  ? Hx of adenomatous colonic polyps 08/30/2017  ? Hyperlipidemia   ? Hypertension   ? Palpitations   ? Pneumonia 2017  ? hx  ? Primary localized osteoarthrosis of left shoulder 12/04/2018  ? ? ?Past Surgical History:  ?Procedure Laterality Date  ? BICEPT TENODESIS Left 08/13/2020  ? Procedure: OPEN BICEPS TENODESIS;  Surgeon: Marchia Bond, MD;  Location: [facility];  Service: Orthopedics;  Laterality: Left;  ? CERVICAL FUSION    ? GROIN EXPLORATION Right 07/03/15  ? cyst removed- local anesthesia morehead  ? LUNG BIOPSY  13  ? pneum, dx copd  ? PARTIAL KNEE ARTHROPLASTY Left 07/27/2021  ? Procedure: UNICOMPARTMENTAL KNEE;  Surgeon: Marchia Bond, MD;  [facility];  Service: Orthopedics;  Laterality: Left;  ? TOTAL HIP ARTHROPLASTY Right 11/07/2014  ? Procedure: RIGHT  TOTAL HIP ARTHROPLASTY;  Surgeon: Earlie Server, MD;  Location: [facility];  Service: Orthopedics;  Laterality: Right;  Would like to flip if possible ? ?  ? TOTAL HIP ARTHROPLASTY Left 07/17/2015  ? Procedure: LEFT TOTAL HIP  ARTHROPLASTY;  Surgeon: Earlie Server, MD;  Location: [facility];  Service: Orthopedics;  Laterality: Left;  ? TOTAL SHOULDER ARTHROPLASTY Left 12/04/2018  ? Procedure: TOTAL SHOULDER ARTHROPLASTY;  Surgeon: Marchia Bond, MD;  [facility];  Service: Orthopedics;  Laterality: Left;  ? VASECTOMY  5/15  ? ? ?There were no vitals filed for this visit. ? ? Subjective Assessment - 08/13/21 0945   ? ? Subjective Doing better   ? Pertinent History Bilateral TKA's, HTN, left biceps tenodesis, cervical fusion, left shoulder arthroplasty.   ? How long can you walk comfortably? With FWW around home.   ? Patient Stated Goals Get out of pain and back to life.   ? Currently in Pain? Yes   ? Pain Score 6    ? Pain Location Knee   ? Pain Orientation Left   ? Pain Descriptors / Indicators Aching;Sore   ? ?  ?  ? ?  ? ? ? ? ? ? ? ? ? ? ? ? ? ? ? ? ? ? ? ? [facility] Adult PT Treatment/Exercise - 08/13/21 0001   ? ?  ? Exercises  ? Exercises Knee/Hip   ?  ? Knee/Hip Exercises: Aerobic  ? Nustep Lvl 3-6 x 15 mins   ?  ? Knee/Hip Exercises: Machines for Strengthening  ?  Cybex Knee Extension 20# 3x20   ?  ? Knee/Hip Exercises: Standing  ? Heel Raises Both;2 sets;15 reps   at wall  ? Heel Raises Limitations toe ups 2x15 at wall   ? Lateral Step Up Left;2 sets;10 reps;Step Height: 4";Hand Hold: 0   ? Forward Step Up Left;2 sets;Step Height: 4";10 reps;Hand Hold: 0   ? Rocker Board 3 minutes   ? SLS balance LT LE   ?  ? Modalities  ? Modalities Electrical Stimulation;Vasopneumatic   ?  ? Electrical Stimulation  ? Electrical Stimulation Location Left knee   ? Electrical Stimulation Action IFC   ? Electrical Stimulation Parameters 80-'150hz'$  x 15 mins   ?  ? Vasopneumatic  ? Number Minutes Vasopneumatic  15 minutes   ? Vasopnuematic Location  Knee   ? Vasopneumatic Pressure Low   ? Vasopneumatic Temperature  34   ?  ? Manual Therapy  ? Manual therapy comments PROM to LT knee 0 to102 degrees   ? ?  ?  ? ?  ? ? ? ? ? ? ? ? ? ? ? ? ? ? ? PT Long Term  Goals - 07/29/21 1233   ? ?  ? PT LONG TERM GOAL #1  ? Title Independent with a HEP.   ? Time 4   ? Period Weeks   ? Status New   ?  ? PT LONG TERM GOAL #2  ? Title Active left knee flexion to 115 degrees+ so the patient can perform functional tasks and do so with pain not > 2-3/10.   ? Time 4   ? Period Weeks   ? Status New   ?  ? PT LONG TERM GOAL #3  ? Title Increase left hip and knee strength to a solid 4+/5 to provide good stability for accomplishment of functional activities   ? Time 4   ? Period Weeks   ? Status New   ?  ? PT LONG TERM GOAL #4  ? Title Full active knee extension in order to normalize gait.   ? Time 4   ? Period Weeks   ? Status New   ?  ? PT LONG TERM GOAL #5  ? Title Perform a reciprocating stair gait with one railing with pain not > 2-3/10.   ? Time 4   ? Period Weeks   ? Status New   ? ?  ?  ? ?  ? ? ? ? ? ? ? ? Plan - 08/13/21 1034   ? ? Clinical Impression Statement Pt arrived today without assistive device and doing fairly well with LT kee. He was able to continue with LT LE strengthening exs as well as balance act,s. He was also able to perform knee extension machine without increased pain. Vaso and estim end of session .108 degrees flexion   ? Personal Factors and Comorbidities Comorbidity 1;Comorbidity 2;Other   ? Comorbidities Bilateral TKA's, HTN, left biceps tenodesis, cervical fusion, left shoulder arthroplasty.   ? Examination-Activity Limitations Other;Transfers;Locomotion Level;Bathing;Stairs   ? Examination-Participation Restrictions Meal Prep;Other;Cleaning;Valla Leaver Work   ? Stability/Clinical Decision Making Stable/Uncomplicated   ? Rehab Potential Excellent   ? PT Frequency 3x / week   ? PT Duration 4 weeks   ? PT Treatment/Interventions ADLs/Self Care Home Management;Cryotherapy;Electrical Stimulation;Gait training;Stair training;Functional mobility training;Therapeutic activities;Therapeutic exercise;Neuromuscular re-education;Manual techniques;Patient/family  education;Passive range of motion;Vasopneumatic Device   ? PT Next Visit Plan Nustep with progression to recumbent bike, PROM.  Progress into TKA protocol.  Vasopneumatic and electrical stimulation.   ? Consulted and Agree with Plan of Care Patient   ? ?  ?  ? ?  ? ? ?Patient will benefit from skilled therapeutic intervention in order to improve the following deficits and impairments:  Pain, Abnormal gait, Difficulty walking, Decreased activity tolerance, Decreased mobility, Decreased range of motion, Decreased strength, Increased edema ? ?Visit Diagnosis: ?Chronic pain of left knee ? ?Stiffness of left knee, not elsewhere classified ? ?Localized edema ? ?Muscle weakness (generalized) ? ? ? ? ?Problem List ?Patient Active Problem List  ? Diagnosis Date Noted  ? Osteoarthritis of left knee 07/14/2021  ? Primary localized osteoarthrosis of left shoulder 12/04/2018  ? Localized primary osteoarthritis of left shoulder region 12/04/2018  ? Hx of adenomatous colonic polyps 08/30/2017  ? Degenerative joint disease of left hip 07/17/2015  ? Primary localized osteoarthritis of right hip 11/07/2014  ? Hyperlipidemia 06/21/2006  ? TOBACCO ABUSE 06/21/2006  ? PRESBYOPIA 06/21/2006  ? Essential hypertension 06/21/2006  ? INSOMNIA 06/21/2006  ? ? ?Ottilie Wigglesworth,CHRIS, PTA ?08/13/2021, 12:31 PM ? ?[facility] ?[place] ?[place], [place], [address] ?Phone: [phone]   Fax:  [phone] ? ?Name: Brandon Ferrell ?MRN: [id number] ?Date of Birth: [birth year] ? ? ? ?

## 2021-08-18 ENCOUNTER — Ambulatory Visit: Payer: Medicare Other | Admitting: Physical Therapy

## 2021-08-20 ENCOUNTER — Ambulatory Visit: Payer: Medicare Other

## 2021-08-20 DIAGNOSIS — G8929 Other chronic pain: Secondary | ICD-10-CM | POA: Diagnosis not present

## 2021-08-20 DIAGNOSIS — R600 Localized edema: Secondary | ICD-10-CM

## 2021-08-20 DIAGNOSIS — M6281 Muscle weakness (generalized): Secondary | ICD-10-CM | POA: Diagnosis not present

## 2021-08-20 DIAGNOSIS — M25662 Stiffness of left knee, not elsewhere classified: Secondary | ICD-10-CM | POA: Diagnosis not present

## 2021-08-20 DIAGNOSIS — M25562 Pain in left knee: Secondary | ICD-10-CM | POA: Diagnosis not present

## 2021-08-20 NOTE — Therapy (Signed)
[facility] [place], [place], [address] Phone: [phone]   Fax:  [phone]  Physical Therapy Treatment  Patient Details  Name: Brandon Ferrell MRN: [id number] Date of Birth: 10-17-1955 Referring Provider (PT): Marchia Bond MD   Encounter Date: 08/20/2021   PT End of Session - 08/20/21 0853     Visit Number 5    Number of Visits 12    Date for PT Re-Evaluation 08/26/21    Authorization Type FOTO AT LEAST EVERY 5TH VISIT.  PROGRESS NOTE AT 10TH VISIT.  KX MODIFIER AFTER 15 VISITS.    PT Start Time 0900    PT Stop Time 8315    PT Time Calculation (min) 51 min             Past Medical History:  Diagnosis Date   Acute renal insufficiency 07/18/2015   Acute urinary retention 07/18/2015   Arthritis    Asthma    as a child   COPD (chronic obstructive pulmonary disease) ([facility])    GERD (gastroesophageal reflux disease)    occ zantac   Hx of adenomatous colonic polyps 08/30/2017   Hyperlipidemia    Hypertension    Palpitations    Pneumonia 2017   hx   Primary localized osteoarthrosis of left shoulder 12/04/2018    Past Surgical History:  Procedure Laterality Date   BICEPT TENODESIS Left 08/13/2020   Procedure: OPEN BICEPS TENODESIS;  Surgeon: Marchia Bond, MD;  Location: [facility];  Service: Orthopedics;  Laterality: Left;   CERVICAL FUSION     GROIN EXPLORATION Right 07/03/15   cyst removed- local anesthesia morehead   LUNG BIOPSY  13   pneum, dx copd   PARTIAL KNEE ARTHROPLASTY Left 07/27/2021   Procedure: UNICOMPARTMENTAL KNEE;  Surgeon: Marchia Bond, MD;  [facility];  Service: Orthopedics;  Laterality: Left;   TOTAL HIP ARTHROPLASTY Right 11/07/2014   Procedure: RIGHT  TOTAL HIP ARTHROPLASTY;  Surgeon: Earlie Server, MD;  Location: [facility];  Service: Orthopedics;  Laterality: Right;  Would like to flip if possible     TOTAL HIP ARTHROPLASTY Left 07/17/2015   Procedure: LEFT TOTAL HIP  ARTHROPLASTY;  Surgeon: Earlie Server, MD;  Location: [facility];  Service: Orthopedics;  Laterality: Left;   TOTAL SHOULDER ARTHROPLASTY Left 12/04/2018   Procedure: TOTAL SHOULDER ARTHROPLASTY;  Surgeon: Marchia Bond, MD;  [facility];  Service: Orthopedics;  Laterality: Left;   VASECTOMY  5/15    There were no vitals filed for this visit.   Subjective Assessment - 08/20/21 0853     Subjective Pt arrives for today's treatment session reporting 7/10 left knee pain.    Pertinent History Bilateral TKA's, HTN, left biceps tenodesis, cervical fusion, left shoulder arthroplasty.    How long can you walk comfortably? With FWW around home.    Patient Stated Goals Get out of pain and back to life.    Currently in Pain? Yes    Pain Score 7     Pain Location Knee    Pain Orientation Left                [facility] PT Assessment - 08/20/21 0001       Observation/Other Assessments   Focus on Therapeutic Outcomes (FOTO)  66   5th visit                          [facility] Adult PT Treatment/Exercise - 08/20/21 0001  Knee/Hip Exercises: Aerobic   Recumbent Bike Lvl 3 x 15 mins      Knee/Hip Exercises: Machines for Strengthening   Cybex Knee Extension 30# x 20 reps    Cybex Knee Flexion 60# x 20 reps    Cybex Leg Press 2 plates x 20 reps      Knee/Hip Exercises: Supine   Heel Prop for Knee Extension 2 minutes      Modalities   Modalities Electrical Stimulation;Vasopneumatic      Electrical Stimulation   Electrical Stimulation Location Left knee    Electrical Stimulation Action IFC    Electrical Stimulation Parameters 80-150 hz x 15 mins    Electrical Stimulation Goals Pain      Vasopneumatic   Number Minutes Vasopneumatic  15 minutes    Vasopnuematic Location  Knee    Vasopneumatic Pressure Low    Vasopneumatic Temperature  34                          PT Long Term Goals - 07/29/21 1233       PT LONG TERM GOAL #1   Title Independent with  a HEP.    Time 4    Period Weeks    Status New      PT LONG TERM GOAL #2   Title Active left knee flexion to 115 degrees+ so the patient can perform functional tasks and do so with pain not > 2-3/10.    Time 4    Period Weeks    Status New      PT LONG TERM GOAL #3   Title Increase left hip and knee strength to a solid 4+/5 to provide good stability for accomplishment of functional activities    Time 4    Period Weeks    Status New      PT LONG TERM GOAL #4   Title Full active knee extension in order to normalize gait.    Time 4    Period Weeks    Status New      PT LONG TERM GOAL #5   Title Perform a reciprocating stair gait with one railing with pain not > 2-3/10.    Time 4    Period Weeks    Status New                   Plan - 08/20/21 1962     Clinical Impression Statement Pt arrives for today's treatment session reporting 7/10 left knee pain.  Pt able to increase FOTO score to 66 today.  Pt able to tolerate recumbent bike today for warm-up with seat on 8.  Pt able to tolerate introduction to cybex with cues required for eccentric control.  Pt challenged by 2 mins on Zero knee to increase left knee extension.  Normal responses to estim and vaso noted upon removal.  Pt reported 5/10 left knee pain at completion of today's treatment session.    Personal Factors and Comorbidities Comorbidity 1;Comorbidity 2;Other    Comorbidities Bilateral TKA's, HTN, left biceps tenodesis, cervical fusion, left shoulder arthroplasty.    Examination-Activity Limitations Other;Transfers;Locomotion Level;Bathing;Stairs    Examination-Participation Restrictions Meal Prep;Other;Cleaning;Yard Work    Stability/Clinical Decision Making Stable/Uncomplicated    Rehab Potential Excellent    PT Frequency 3x / week    PT Duration 4 weeks    PT Treatment/Interventions ADLs/Self Care Home Management;Cryotherapy;Electrical Stimulation;Gait training;Stair training;Functional mobility  training;Therapeutic activities;Therapeutic exercise;Neuromuscular re-education;Manual techniques;Patient/family education;Passive range of motion;Vasopneumatic  Device    PT Next Visit Plan Nustep with progression to recumbent bike, PROM.  Progress into TKA protocol.  Vasopneumatic and electrical stimulation.    Consulted and Agree with Plan of Care Patient             Patient will benefit from skilled therapeutic intervention in order to improve the following deficits and impairments:  Pain, Abnormal gait, Difficulty walking, Decreased activity tolerance, Decreased mobility, Decreased range of motion, Decreased strength, Increased edema  Visit Diagnosis: Chronic pain of left knee  Stiffness of left knee, not elsewhere classified  Localized edema     Problem List Patient Active Problem List   Diagnosis Date Noted   Osteoarthritis of left knee 07/14/2021   Primary localized osteoarthrosis of left shoulder 12/04/2018   Localized primary osteoarthritis of left shoulder region 12/04/2018   Hx of adenomatous colonic polyps 08/30/2017   Degenerative joint disease of left hip 07/17/2015   Primary localized osteoarthritis of right hip 11/07/2014   Hyperlipidemia 06/21/2006   TOBACCO ABUSE 06/21/2006   PRESBYOPIA 06/21/2006   Essential hypertension 06/21/2006   INSOMNIA 06/21/2006    Kathrynn Ducking, PTA 08/20/2021, 9:53 AM  [facility] [address] [place], [place], [address] Phone: [phone]   Fax:  [phone]  Name: Brandon Ferrell MRN: [id number] Date of Birth: 1955/06/07

## 2021-08-24 ENCOUNTER — Ambulatory Visit: Payer: Medicare Other | Admitting: *Deleted

## 2021-08-31 ENCOUNTER — Ambulatory Visit: Payer: Medicare Other | Admitting: *Deleted

## 2021-08-31 DIAGNOSIS — M25662 Stiffness of left knee, not elsewhere classified: Secondary | ICD-10-CM

## 2021-08-31 DIAGNOSIS — R600 Localized edema: Secondary | ICD-10-CM

## 2021-08-31 DIAGNOSIS — M25562 Pain in left knee: Secondary | ICD-10-CM | POA: Diagnosis not present

## 2021-08-31 DIAGNOSIS — M6281 Muscle weakness (generalized): Secondary | ICD-10-CM | POA: Diagnosis not present

## 2021-08-31 DIAGNOSIS — G8929 Other chronic pain: Secondary | ICD-10-CM | POA: Diagnosis not present

## 2021-08-31 NOTE — Therapy (Signed)
[facility] [place], [place], [address] Phone: [phone]   Fax:  [phone]  Physical Therapy Treatment  Patient Details  Name: Brandon Ferrell MRN: [id number] Date of Birth: 1955-07-06 Referring Provider (PT): Marchia Bond MD   Encounter Date: 08/31/2021   PT End of Session - 08/31/21 1030     Visit Number 6    Number of Visits 12    Date for PT Re-Evaluation 08/26/21    Authorization Type FOTO AT LEAST EVERY 5TH VISIT.  PROGRESS NOTE AT 10TH VISIT.  KX MODIFIER AFTER 15 VISITS.    PT Start Time 0017    PT Stop Time 4944    PT Time Calculation (min) 51 min             Past Medical History:  Diagnosis Date   Acute renal insufficiency 07/18/2015   Acute urinary retention 07/18/2015   Arthritis    Asthma    as a child   COPD (chronic obstructive pulmonary disease) (HCC)    GERD (gastroesophageal reflux disease)    occ zantac   Hx of adenomatous colonic polyps 08/30/2017   Hyperlipidemia    Hypertension    Palpitations    Pneumonia 2017   hx   Primary localized osteoarthrosis of left shoulder 12/04/2018    Past Surgical History:  Procedure Laterality Date   BICEPT TENODESIS Left 08/13/2020   Procedure: OPEN BICEPS TENODESIS;  Surgeon: Marchia Bond, MD;  Location: [facility];  Service: Orthopedics;  Laterality: Left;   CERVICAL FUSION     GROIN EXPLORATION Right 07/03/15   cyst removed- local anesthesia [place]   LUNG BIOPSY  13   pneum, dx copd   PARTIAL KNEE ARTHROPLASTY Left 07/27/2021   Procedure: UNICOMPARTMENTAL KNEE;  Surgeon: Marchia Bond, MD;  Location: [facility];  Service: Orthopedics;  Laterality: Left;   TOTAL HIP ARTHROPLASTY Right 11/07/2014   Procedure: RIGHT  TOTAL HIP ARTHROPLASTY;  Surgeon: Earlie Server, MD;  Location: [facility];  Service: Orthopedics;  Laterality: Right;  Would like to flip if possible     TOTAL HIP ARTHROPLASTY Left 07/17/2015   Procedure: LEFT TOTAL HIP  ARTHROPLASTY;  Surgeon: Earlie Server, MD;  Location: [facility];  Service: Orthopedics;  Laterality: Left;   TOTAL SHOULDER ARTHROPLASTY Left 12/04/2018   Procedure: TOTAL SHOULDER ARTHROPLASTY;  Surgeon: Marchia Bond, MD;  Location: [facility];  Service: Orthopedics;  Laterality: Left;   VASECTOMY  5/15    There were no vitals filed for this visit.   Subjective Assessment - 08/31/21 1003     Subjective Pt arrives for today's treatment session reporting 7/10 left knee pain still. Moving better    Pertinent History Bilateral TKA's, HTN, left biceps tenodesis, cervical fusion, left shoulder arthroplasty.    How long can you walk comfortably? With FWW around home.    Patient Stated Goals Get out of pain and back to life.    Currently in Pain? Yes    Pain Location Knee    Pain Orientation Left    Pain Descriptors / Indicators Aching;Sore                               [facility] Adult PT Treatment/Exercise - 08/31/21 0001       Knee/Hip Exercises: Aerobic   Recumbent Bike Lvl 3 x 15 mins      Knee/Hip Exercises: Machines for Strengthening   Cybex Knee Extension 40#  x 20 reps    Cybex Knee Flexion 60# x 20 reps      Knee/Hip Exercises: Standing   Lateral Step Up Left;2 sets;10 reps;Hand Hold: 0;Step Height: 6"    Forward Step Up Left;2 sets;10 reps;Hand Hold: 0;Step Height: 6"    Rocker Board 3 minutes    SLS balance LT LE      Modalities   Modalities Electrical Stimulation;Vasopneumatic      Electrical Stimulation   Electrical Stimulation Location Left knee    Electrical Stimulation Action IFC    Electrical Stimulation Parameters 80-'150hz'$  x 15 mins    Electrical Stimulation Goals Pain      Vasopneumatic   Number Minutes Vasopneumatic  15 minutes    Vasopnuematic Location  Knee    Vasopneumatic Pressure Low    Vasopneumatic Temperature  34      Manual Therapy   Manual therapy comments PROM 0-115 degrees                          PT Long Term  Goals - 07/29/21 1233       PT LONG TERM GOAL #1   Title Independent with a HEP.    Time 4    Period Weeks    Status New      PT LONG TERM GOAL #2   Title Active left knee flexion to 115 degrees+ so the patient can perform functional tasks and do so with pain not > 2-3/10.    Time 4    Period Weeks    Status New      PT LONG TERM GOAL #3   Title Increase left hip and knee strength to a solid 4+/5 to provide good stability for accomplishment of functional activities    Time 4    Period Weeks    Status New      PT LONG TERM GOAL #4   Title Full active knee extension in order to normalize gait.    Time 4    Period Weeks    Status New      PT LONG TERM GOAL #5   Title Perform a reciprocating stair gait with one railing with pain not > 2-3/10.    Time 4    Period Weeks    Status New                   Plan - 08/31/21 1024     Clinical Impression Statement Pt arrived today with pain 7/10 LT knee. Rx progression focused on ROM, strengthening as well as proprioception. SLS on LT LE is still fairly challenging. ROM progression 0-115 degrees today    Personal Factors and Comorbidities Comorbidity 1;Comorbidity 2;Other    Comorbidities Bilateral TKA's, HTN, left biceps tenodesis, cervical fusion, left shoulder arthroplasty.    Examination-Participation Restrictions Meal Prep;Other;Cleaning;Yard Work    Stability/Clinical Decision Making Stable/Uncomplicated    Rehab Potential Excellent    PT Frequency 3x / week    PT Duration 4 weeks    PT Treatment/Interventions ADLs/Self Care Home Management;Cryotherapy;Electrical Stimulation;Gait training;Stair training;Functional mobility training;Therapeutic activities;Therapeutic exercise;Neuromuscular re-education;Manual techniques;Patient/family education;Passive range of motion;Vasopneumatic Device    PT Next Visit Plan Nustep with progression to recumbent bike, PROM.  Progress into TKA protocol.  Vasopneumatic and electrical  stimulation.             Patient will benefit from skilled therapeutic intervention in order to improve the following deficits and impairments:  Pain, Abnormal gait,  Difficulty walking, Decreased activity tolerance, Decreased mobility, Decreased range of motion, Decreased strength, Increased edema  Visit Diagnosis: Chronic pain of left knee  Stiffness of left knee, not elsewhere classified  Localized edema  Muscle weakness (generalized)     Problem List Patient Active Problem List   Diagnosis Date Noted   Osteoarthritis of left knee 07/14/2021   Primary localized osteoarthrosis of left shoulder 12/04/2018   Localized primary osteoarthritis of left shoulder region 12/04/2018   Hx of adenomatous colonic polyps 08/30/2017   Degenerative joint disease of left hip 07/17/2015   Primary localized osteoarthritis of right hip 11/07/2014   Hyperlipidemia 06/21/2006   TOBACCO ABUSE 06/21/2006   PRESBYOPIA 06/21/2006   Essential hypertension 06/21/2006   INSOMNIA 06/21/2006    Dashley Monts,CHRIS, PTA 08/31/2021, 10:50 AM  [facility] [address] [place], [place], [address] Phone: [phone]   Fax:  [phone]  Name: Brandon Ferrell MRN: [id number] Date of Birth: 1955-05-05

## 2021-09-02 ENCOUNTER — Ambulatory Visit: Payer: Medicare Other | Attending: Orthopedic Surgery

## 2021-09-02 DIAGNOSIS — M25662 Stiffness of left knee, not elsewhere classified: Secondary | ICD-10-CM | POA: Insufficient documentation

## 2021-09-02 DIAGNOSIS — R600 Localized edema: Secondary | ICD-10-CM | POA: Insufficient documentation

## 2021-09-02 DIAGNOSIS — G8929 Other chronic pain: Secondary | ICD-10-CM | POA: Insufficient documentation

## 2021-09-02 DIAGNOSIS — M6281 Muscle weakness (generalized): Secondary | ICD-10-CM | POA: Insufficient documentation

## 2021-09-02 DIAGNOSIS — M25562 Pain in left knee: Secondary | ICD-10-CM | POA: Insufficient documentation

## 2021-09-02 NOTE — Therapy (Signed)
[facility] [place], [place], [address] Phone: [phone]   Fax:  [phone]  Physical Therapy Treatment  Patient Details  Name: Brandon Ferrell MRN: [id number] Date of Birth: [birth year] Referring Provider (PT): Marchia Bond MD   Encounter Date: 09/02/2021   PT End of Session - 09/02/21 0951     Visit Number 7    Number of Visits 12    Date for PT Re-Evaluation 08/26/21    Authorization Type FOTO AT LEAST EVERY 5TH VISIT.  PROGRESS NOTE AT 10TH VISIT.  KX MODIFIER AFTER 15 VISITS.    PT Start Time 0945    PT Stop Time 1027    PT Time Calculation (min) 42 min    Activity Tolerance Patient tolerated treatment well    Behavior During Therapy [facility] for tasks assessed/performed             Past Medical History:  Diagnosis Date   Acute renal insufficiency 07/18/2015   Acute urinary retention 07/18/2015   Arthritis    Asthma    as a child   COPD (chronic obstructive pulmonary disease) ([facility])    GERD (gastroesophageal reflux disease)    occ zantac   Hx of adenomatous colonic polyps 08/30/2017   Hyperlipidemia    Hypertension    Palpitations    Pneumonia 2017   hx   Primary localized osteoarthrosis of left shoulder 12/04/2018    Past Surgical History:  Procedure Laterality Date   BICEPT TENODESIS Left 08/13/2020   Procedure: OPEN BICEPS TENODESIS;  Surgeon: Marchia Bond, MD;  Location: [facility];  Service: Orthopedics;  Laterality: Left;   CERVICAL FUSION     GROIN EXPLORATION Right 07/03/15   cyst removed- local anesthesia [place]   LUNG BIOPSY  13   pneum, dx copd   PARTIAL KNEE ARTHROPLASTY Left 07/27/2021   Procedure: UNICOMPARTMENTAL KNEE;  Surgeon: Marchia Bond, MD;  Location: [facility];  Service: Orthopedics;  Laterality: Left;   TOTAL HIP ARTHROPLASTY Right 11/07/2014   Procedure: RIGHT  TOTAL HIP ARTHROPLASTY;  Surgeon: Earlie Server, MD;  Location: [facility];  Service: Orthopedics;   Laterality: Right;  Would like to flip if possible     TOTAL HIP ARTHROPLASTY Left 07/17/2015   Procedure: LEFT TOTAL HIP ARTHROPLASTY;  Surgeon: Earlie Server, MD;  Location: [facility];  Service: Orthopedics;  Laterality: Left;   TOTAL SHOULDER ARTHROPLASTY Left 12/04/2018   Procedure: TOTAL SHOULDER ARTHROPLASTY;  Surgeon: Marchia Bond, MD;  Location: [facility];  Service: Orthopedics;  Laterality: Left;   VASECTOMY  5/15    There were no vitals filed for this visit.   Subjective Assessment - 09/02/21 0950     Subjective Patient reports that he hyperextended his knee yesterday while walking which has caused his knee to hurt a lot more today.    Pertinent History Bilateral TKA's, HTN, left biceps tenodesis, cervical fusion, left shoulder arthroplasty.    How long can you walk comfortably? With FWW around home.    Patient Stated Goals Get out of pain and back to life.    Currently in Pain? Yes    Pain Score 9     Pain Location Knee    Pain Orientation Left                [facility] PT Assessment - 09/02/21 0001       AROM   AROM Assessment Site Knee    Right/Left Knee Left    Left Knee Extension  2    Left Knee Flexion 109   limited by pain                          [facility] Adult PT Treatment/Exercise - 09/02/21 0001       Knee/Hip Exercises: Aerobic   Recumbent Bike L1 x 12.5 minutes   limited by pain     Knee/Hip Exercises: Standing   Functional Squat 1 set;15 reps   mini squat; limited by pain     Knee/Hip Exercises: Seated   Long Arc Quad Left;5 reps   limited by pain     Modalities   Modalities Psychologist, educational Location Left knee    Electrical Stimulation Action IFC    Electrical Stimulation Parameters 80-150 Hz w/ 40% scan x 15 minutes    Electrical Stimulation Goals Pain      Vasopneumatic   Number Minutes Vasopneumatic  15 minutes    Vasopnuematic Location  Knee     Vasopneumatic Pressure Low    Vasopneumatic Temperature  34                          PT Long Term Goals - 09/02/21 1018       PT LONG TERM GOAL #1   Title Independent with a HEP.    Time 4    Period Weeks    Status On-going      PT LONG TERM GOAL #2   Title Active left knee flexion to 115 degrees+ so the patient can perform functional tasks and do so with pain not > 2-3/10.    Time 4    Period Weeks    Status On-going      PT LONG TERM GOAL #3   Title Increase left hip and knee strength to a solid 4+/5 to provide good stability for accomplishment of functional activities    Time 4    Period Weeks    Status On-going      PT LONG TERM GOAL #4   Title Full active knee extension in order to normalize gait.    Time 4    Period Weeks    Status On-going      PT LONG TERM GOAL #5   Title Perform a reciprocating stair gait with one railing with pain not > 2-3/10.    Time 4    Period Weeks    Status On-going                   Plan - 09/02/21 8938     Clinical Impression Statement Patient presented to treatment with increased left knee pain as he reported that he hyperextended his knee while walking yesterday. This pain limited his ability to complete today's active interventions. Modalities were the most effective as the reduced his pain from a 9/10 to a 7/10 at the conclusion of treatment. Recommend that he continue with skilled physical therapy to address his remaining impairments to return to his prior level of function.    Personal Factors and Comorbidities Comorbidity 1;Comorbidity 2;Other    Comorbidities Bilateral TKA's, HTN, left biceps tenodesis, cervical fusion, left shoulder arthroplasty.    Examination-Participation Restrictions Meal Prep;Other;Cleaning;Yard Work    Stability/Clinical Decision Making Stable/Uncomplicated    Rehab Potential Excellent    PT Frequency 3x / week    PT Duration 4 weeks    PT  Treatment/Interventions ADLs/Self  Care Home Management;Cryotherapy;Electrical Stimulation;Gait training;Stair training;Functional mobility training;Therapeutic activities;Therapeutic exercise;Neuromuscular re-education;Manual techniques;Patient/family education;Passive range of motion;Vasopneumatic Device    PT Next Visit Plan Nustep with progression to recumbent bike, PROM.  Progress into TKA protocol.  Vasopneumatic and electrical stimulation.    Consulted and Agree with Plan of Care Patient             Patient will benefit from skilled therapeutic intervention in order to improve the following deficits and impairments:  Pain, Abnormal gait, Difficulty walking, Decreased activity tolerance, Decreased mobility, Decreased range of motion, Decreased strength, Increased edema  Visit Diagnosis: Chronic pain of left knee  Stiffness of left knee, not elsewhere classified  Localized edema  Muscle weakness (generalized)     Problem List Patient Active Problem List   Diagnosis Date Noted   Osteoarthritis of left knee 07/14/2021   Primary localized osteoarthrosis of left shoulder 12/04/2018   Localized primary osteoarthritis of left shoulder region 12/04/2018   Hx of adenomatous colonic polyps 08/30/2017   Degenerative joint disease of left hip 07/17/2015   Primary localized osteoarthritis of right hip 11/07/2014   Hyperlipidemia 06/21/2006   TOBACCO ABUSE 06/21/2006   PRESBYOPIA 06/21/2006   Essential hypertension 06/21/2006   INSOMNIA 06/21/2006   Rationale for Evaluation and Treatment Rehabilitation   Darlin Coco, PT 09/02/2021, 10:34 AM  [facility] [address] [place], [place], [address] Phone: [phone]   Fax:  [phone]  Name: LAMICHAEL YOUKHANA MRN: [id number] Date of Birth: [birth year]

## 2021-09-06 DIAGNOSIS — M1712 Unilateral primary osteoarthritis, left knee: Secondary | ICD-10-CM | POA: Diagnosis not present

## 2021-09-07 DIAGNOSIS — M25562 Pain in left knee: Secondary | ICD-10-CM | POA: Diagnosis not present

## 2021-09-07 DIAGNOSIS — G8929 Other chronic pain: Secondary | ICD-10-CM | POA: Diagnosis not present

## 2021-09-07 DIAGNOSIS — Z79899 Other long term (current) drug therapy: Secondary | ICD-10-CM | POA: Diagnosis not present

## 2021-09-07 DIAGNOSIS — J449 Chronic obstructive pulmonary disease, unspecified: Secondary | ICD-10-CM | POA: Diagnosis not present

## 2021-09-07 DIAGNOSIS — R030 Elevated blood-pressure reading, without diagnosis of hypertension: Secondary | ICD-10-CM | POA: Diagnosis not present

## 2021-09-07 DIAGNOSIS — F17210 Nicotine dependence, cigarettes, uncomplicated: Secondary | ICD-10-CM | POA: Diagnosis not present

## 2021-09-08 ENCOUNTER — Ambulatory Visit: Payer: Medicare Other | Admitting: Physical Therapy

## 2021-09-08 ENCOUNTER — Encounter: Payer: Self-pay | Admitting: Physical Therapy

## 2021-09-08 DIAGNOSIS — G8929 Other chronic pain: Secondary | ICD-10-CM | POA: Diagnosis not present

## 2021-09-08 DIAGNOSIS — M25662 Stiffness of left knee, not elsewhere classified: Secondary | ICD-10-CM | POA: Diagnosis not present

## 2021-09-08 DIAGNOSIS — M6281 Muscle weakness (generalized): Secondary | ICD-10-CM

## 2021-09-08 DIAGNOSIS — R600 Localized edema: Secondary | ICD-10-CM

## 2021-09-08 DIAGNOSIS — M25562 Pain in left knee: Secondary | ICD-10-CM | POA: Diagnosis not present

## 2021-09-08 NOTE — Therapy (Signed)
[facility] [place], [place], [address] Phone: [phone]   Fax:  [phone]  Physical Therapy Treatment  Patient Details  Name: Brandon Ferrell MRN: [id number] Date of Birth: 08-15-1955 Referring Provider (PT): Marchia Bond MD   Encounter Date: 09/08/2021   PT End of Session - 09/08/21 0937     Visit Number 8    Number of Visits 12    Date for PT Re-Evaluation 08/26/21    Authorization Type FOTO AT LEAST EVERY 5TH VISIT.  PROGRESS NOTE AT 10TH VISIT.  KX MODIFIER AFTER 15 VISITS.    PT Start Time 0900    PT Stop Time 6237    PT Time Calculation (min) 50 min    Activity Tolerance Patient tolerated treatment well    Behavior During Therapy [facility] for tasks assessed/performed             Past Medical History:  Diagnosis Date   Acute renal insufficiency 07/18/2015   Acute urinary retention 07/18/2015   Arthritis    Asthma    as a child   COPD (chronic obstructive pulmonary disease) (HCC)    GERD (gastroesophageal reflux disease)    occ zantac   Hx of adenomatous colonic polyps 08/30/2017   Hyperlipidemia    Hypertension    Palpitations    Pneumonia 2017   hx   Primary localized osteoarthrosis of left shoulder 12/04/2018    Past Surgical History:  Procedure Laterality Date   BICEPT TENODESIS Left 08/13/2020   Procedure: OPEN BICEPS TENODESIS;  Surgeon: Marchia Bond, MD;  Location: [facility];  Service: Orthopedics;  Laterality: Left;   CERVICAL FUSION     GROIN EXPLORATION Right 07/03/15   cyst removed- local anesthesia morehead   LUNG BIOPSY  13   pneum, dx copd   PARTIAL KNEE ARTHROPLASTY Left 07/27/2021   Procedure: UNICOMPARTMENTAL KNEE;  Surgeon: Marchia Bond, MD;  Location: [facility];  Service: Orthopedics;  Laterality: Left;   TOTAL HIP ARTHROPLASTY Right 11/07/2014   Procedure: RIGHT  TOTAL HIP ARTHROPLASTY;  Surgeon: Earlie Server, MD;  Location: [facility];  Service: Orthopedics;   Laterality: Right;  Would like to flip if possible     TOTAL HIP ARTHROPLASTY Left 07/17/2015   Procedure: LEFT TOTAL HIP ARTHROPLASTY;  Surgeon: Earlie Server, MD;  Location: [facility];  Service: Orthopedics;  Laterality: Left;   TOTAL SHOULDER ARTHROPLASTY Left 12/04/2018   Procedure: TOTAL SHOULDER ARTHROPLASTY;  Surgeon: Marchia Bond, MD;  Location[facility];  Service: Orthopedics;  Laterality: Left;   VASECTOMY  5/15    There were no vitals filed for this visit.   Subjective Assessment - 09/08/21 0917     Subjective Reports that MD states that hyperextension is from weakness. Goes to get his pain meds today.    Pertinent History Bilateral TKA's, HTN, left biceps tenodesis, cervical fusion, left shoulder arthroplasty.    How long can you walk comfortably? With FWW around home.    Patient Stated Goals Get out of pain and back to life.    Currently in Pain? Yes    Pain Score 9     Pain Location Knee    Pain Orientation Left    Pain Descriptors / Indicators Aching;Sore    Pain Type Chronic pain    Pain Onset More than a month ago    Pain Frequency Constant                [facility] PT Assessment - 09/08/21  0001       Assessment   Medical Diagnosis Left partial knee replacement    Referring Provider (PT) Marchia Bond MD    Next MD Visit 10/2021      Precautions   Precaution Comments No ultrasound.      Restrictions   Weight Bearing Restrictions No                           [facility] Adult PT Treatment/Exercise - 09/08/21 0001       Knee/Hip Exercises: Aerobic   Recumbent Bike L3 x15 min      Knee/Hip Exercises: Machines for Strengthening   Cybex Knee Extension 40# x 20 reps    Cybex Knee Flexion 60# x 20 reps    Cybex Leg Press 2 plates x 20 reps      Knee/Hip Exercises: Seated   Sit to Sand 10 reps;with UE support   RLE 75% WB     Knee/Hip Exercises: Supine   Single Leg Bridge Strengthening;Left;10 reps    Straight Leg Raises Strengthening;Left;15  reps      Knee/Hip Exercises: Sidelying   Hip ABduction Strengthening;Left;15 reps      Modalities   Modalities Electrical Stimulation;Vasopneumatic      Acupuncturist Location L knee    Electrical Stimulation Action IFC    Electrical Stimulation Parameters 80-150 hz x15 min    Electrical Stimulation Goals Pain      Vasopneumatic   Number Minutes Vasopneumatic  15 minutes    Vasopnuematic Location  Knee    Vasopneumatic Pressure Low    Vasopneumatic Temperature  34                          PT Long Term Goals - 09/02/21 1018       PT LONG TERM GOAL #1   Title Independent with a HEP.    Time 4    Period Weeks    Status On-going      PT LONG TERM GOAL #2   Title Active left knee flexion to 115 degrees+ so the patient can perform functional tasks and do so with pain not > 2-3/10.    Time 4    Period Weeks    Status On-going      PT LONG TERM GOAL #3   Title Increase left hip and knee strength to a solid 4+/5 to provide good stability for accomplishment of functional activities    Time 4    Period Weeks    Status On-going      PT LONG TERM GOAL #4   Title Full active knee extension in order to normalize gait.    Time 4    Period Weeks    Status On-going      PT LONG TERM GOAL #5   Title Perform a reciprocating stair gait with one railing with pain not > 2-3/10.    Time 4    Period Weeks    Status On-going                   Plan - 09/08/21 0942     Clinical Impression Statement Patient presented in clinic with reports of 9/10 L knee pain. Patient has to most difficulties upon waking in the morning and after prolonged standing. MD corrlated the hyperextension to muscle weakness at latest postsurgical visit. Patient guided through machine strengthening as well as  LLE WB focused exercise. Normal modalities response noted following removal of the modalities.    Personal Factors and Comorbidities Comorbidity  1;Comorbidity 2;Other    Comorbidities Bilateral TKA's, HTN, left biceps tenodesis, cervical fusion, left shoulder arthroplasty.    Examination-Activity Limitations Other;Transfers;Locomotion Level;Bathing;Stairs    Examination-Participation Restrictions Meal Prep;Other;Cleaning;Yard Work    Stability/Clinical Decision Making Stable/Uncomplicated    Rehab Potential Excellent    PT Frequency 3x / week    PT Duration 4 weeks    PT Treatment/Interventions ADLs/Self Care Home Management;Cryotherapy;Electrical Stimulation;Gait training;Stair training;Functional mobility training;Therapeutic activities;Therapeutic exercise;Neuromuscular re-education;Manual techniques;Patient/family education;Passive range of motion;Vasopneumatic Device    PT Next Visit Plan Nustep with progression to recumbent bike, PROM.  Progress into TKA protocol.  Vasopneumatic and electrical stimulation.    Consulted and Agree with Plan of Care Patient             Patient will benefit from skilled therapeutic intervention in order to improve the following deficits and impairments:  Pain, Abnormal gait, Difficulty walking, Decreased activity tolerance, Decreased mobility, Decreased range of motion, Decreased strength, Increased edema  Visit Diagnosis: Chronic pain of left knee  Stiffness of left knee, not elsewhere classified  Localized edema  Muscle weakness (generalized)     Problem List Patient Active Problem List   Diagnosis Date Noted   Osteoarthritis of left knee 07/14/2021   Primary localized osteoarthrosis of left shoulder 12/04/2018   Localized primary osteoarthritis of left shoulder region 12/04/2018   Hx of adenomatous colonic polyps 08/30/2017   Degenerative joint disease of left hip 07/17/2015   Primary localized osteoarthritis of right hip 11/07/2014   Hyperlipidemia 06/21/2006   TOBACCO ABUSE 06/21/2006   PRESBYOPIA 06/21/2006   Essential hypertension 06/21/2006   INSOMNIA 06/21/2006    Rationale for Evaluation and Treatment Rehabilitation  Standley Brooking, PTA 09/08/2021, 9:56 AM  [facility] [address] [place], [place], [address] Phone: [phone]   Fax:  [phone]  Name: Brandon Ferrell MRN: [id number] Date of Birth: 1955-04-11

## 2021-09-08 NOTE — Addendum Note (Signed)
Addended by: Correna Meacham, [place] W on: 09/08/2021 02:58 PM   Modules accepted: Orders

## 2021-10-04 DIAGNOSIS — F17210 Nicotine dependence, cigarettes, uncomplicated: Secondary | ICD-10-CM | POA: Diagnosis not present

## 2021-10-04 DIAGNOSIS — G8929 Other chronic pain: Secondary | ICD-10-CM | POA: Diagnosis not present

## 2021-10-04 DIAGNOSIS — J449 Chronic obstructive pulmonary disease, unspecified: Secondary | ICD-10-CM | POA: Diagnosis not present

## 2021-10-04 DIAGNOSIS — Z79899 Other long term (current) drug therapy: Secondary | ICD-10-CM | POA: Diagnosis not present

## 2021-10-04 DIAGNOSIS — M25562 Pain in left knee: Secondary | ICD-10-CM | POA: Diagnosis not present

## 2021-10-04 DIAGNOSIS — M5432 Sciatica, left side: Secondary | ICD-10-CM | POA: Diagnosis not present

## 2021-11-02 DIAGNOSIS — M25562 Pain in left knee: Secondary | ICD-10-CM | POA: Diagnosis not present

## 2021-11-02 DIAGNOSIS — R5383 Other fatigue: Secondary | ICD-10-CM | POA: Diagnosis not present

## 2021-11-02 DIAGNOSIS — E559 Vitamin D deficiency, unspecified: Secondary | ICD-10-CM | POA: Diagnosis not present

## 2021-11-02 DIAGNOSIS — G8929 Other chronic pain: Secondary | ICD-10-CM | POA: Diagnosis not present

## 2021-11-02 DIAGNOSIS — Z79899 Other long term (current) drug therapy: Secondary | ICD-10-CM | POA: Diagnosis not present

## 2021-11-02 DIAGNOSIS — F17210 Nicotine dependence, cigarettes, uncomplicated: Secondary | ICD-10-CM | POA: Diagnosis not present

## 2021-11-02 DIAGNOSIS — M25552 Pain in left hip: Secondary | ICD-10-CM | POA: Diagnosis not present

## 2021-11-02 DIAGNOSIS — M25551 Pain in right hip: Secondary | ICD-10-CM | POA: Diagnosis not present

## 2021-11-02 DIAGNOSIS — E7800 Pure hypercholesterolemia, unspecified: Secondary | ICD-10-CM | POA: Diagnosis not present

## 2021-11-02 DIAGNOSIS — J449 Chronic obstructive pulmonary disease, unspecified: Secondary | ICD-10-CM | POA: Diagnosis not present

## 2021-12-05 DIAGNOSIS — G8929 Other chronic pain: Secondary | ICD-10-CM | POA: Diagnosis not present

## 2021-12-05 DIAGNOSIS — J449 Chronic obstructive pulmonary disease, unspecified: Secondary | ICD-10-CM | POA: Diagnosis not present

## 2021-12-05 DIAGNOSIS — M25562 Pain in left knee: Secondary | ICD-10-CM | POA: Diagnosis not present

## 2021-12-05 DIAGNOSIS — F17210 Nicotine dependence, cigarettes, uncomplicated: Secondary | ICD-10-CM | POA: Diagnosis not present

## 2021-12-05 DIAGNOSIS — Z79899 Other long term (current) drug therapy: Secondary | ICD-10-CM | POA: Diagnosis not present

## 2021-12-05 DIAGNOSIS — R030 Elevated blood-pressure reading, without diagnosis of hypertension: Secondary | ICD-10-CM | POA: Diagnosis not present

## 2022-01-11 DIAGNOSIS — Z79899 Other long term (current) drug therapy: Secondary | ICD-10-CM | POA: Diagnosis not present

## 2022-01-11 DIAGNOSIS — J449 Chronic obstructive pulmonary disease, unspecified: Secondary | ICD-10-CM | POA: Diagnosis not present

## 2022-01-11 DIAGNOSIS — R030 Elevated blood-pressure reading, without diagnosis of hypertension: Secondary | ICD-10-CM | POA: Diagnosis not present

## 2022-01-11 DIAGNOSIS — R7309 Other abnormal glucose: Secondary | ICD-10-CM | POA: Diagnosis not present

## 2022-01-11 DIAGNOSIS — M25562 Pain in left knee: Secondary | ICD-10-CM | POA: Diagnosis not present

## 2022-01-11 DIAGNOSIS — D229 Melanocytic nevi, unspecified: Secondary | ICD-10-CM | POA: Diagnosis not present

## 2022-01-11 DIAGNOSIS — G8929 Other chronic pain: Secondary | ICD-10-CM | POA: Diagnosis not present

## 2022-01-11 DIAGNOSIS — F17210 Nicotine dependence, cigarettes, uncomplicated: Secondary | ICD-10-CM | POA: Diagnosis not present

## 2022-01-11 DIAGNOSIS — K2960 Other gastritis without bleeding: Secondary | ICD-10-CM | POA: Diagnosis not present

## 2022-02-14 DIAGNOSIS — D2372 Other benign neoplasm of skin of left lower limb, including hip: Secondary | ICD-10-CM | POA: Diagnosis not present

## 2022-02-15 DIAGNOSIS — R7309 Other abnormal glucose: Secondary | ICD-10-CM | POA: Diagnosis not present

## 2022-02-15 DIAGNOSIS — F17210 Nicotine dependence, cigarettes, uncomplicated: Secondary | ICD-10-CM | POA: Diagnosis not present

## 2022-02-15 DIAGNOSIS — J449 Chronic obstructive pulmonary disease, unspecified: Secondary | ICD-10-CM | POA: Diagnosis not present

## 2022-02-15 DIAGNOSIS — G8929 Other chronic pain: Secondary | ICD-10-CM | POA: Diagnosis not present

## 2022-02-15 DIAGNOSIS — K2960 Other gastritis without bleeding: Secondary | ICD-10-CM | POA: Diagnosis not present

## 2022-02-15 DIAGNOSIS — R030 Elevated blood-pressure reading, without diagnosis of hypertension: Secondary | ICD-10-CM | POA: Diagnosis not present

## 2022-02-15 DIAGNOSIS — M25562 Pain in left knee: Secondary | ICD-10-CM | POA: Diagnosis not present

## 2022-02-15 DIAGNOSIS — Z79899 Other long term (current) drug therapy: Secondary | ICD-10-CM | POA: Diagnosis not present

## 2022-03-14 DIAGNOSIS — D2372 Other benign neoplasm of skin of left lower limb, including hip: Secondary | ICD-10-CM | POA: Diagnosis not present

## 2022-03-14 DIAGNOSIS — D239 Other benign neoplasm of skin, unspecified: Secondary | ICD-10-CM | POA: Diagnosis not present

## 2022-03-15 DIAGNOSIS — F17210 Nicotine dependence, cigarettes, uncomplicated: Secondary | ICD-10-CM | POA: Diagnosis not present

## 2022-03-15 DIAGNOSIS — R030 Elevated blood-pressure reading, without diagnosis of hypertension: Secondary | ICD-10-CM | POA: Diagnosis not present

## 2022-03-15 DIAGNOSIS — G8929 Other chronic pain: Secondary | ICD-10-CM | POA: Diagnosis not present

## 2022-03-15 DIAGNOSIS — J449 Chronic obstructive pulmonary disease, unspecified: Secondary | ICD-10-CM | POA: Diagnosis not present

## 2022-03-15 DIAGNOSIS — K2960 Other gastritis without bleeding: Secondary | ICD-10-CM | POA: Diagnosis not present

## 2022-03-15 DIAGNOSIS — G4730 Sleep apnea, unspecified: Secondary | ICD-10-CM | POA: Diagnosis not present

## 2022-03-15 DIAGNOSIS — M25562 Pain in left knee: Secondary | ICD-10-CM | POA: Diagnosis not present

## 2022-03-15 DIAGNOSIS — R7309 Other abnormal glucose: Secondary | ICD-10-CM | POA: Diagnosis not present

## 2022-03-15 DIAGNOSIS — Z79899 Other long term (current) drug therapy: Secondary | ICD-10-CM | POA: Diagnosis not present

## 2022-03-21 DIAGNOSIS — F17210 Nicotine dependence, cigarettes, uncomplicated: Secondary | ICD-10-CM | POA: Diagnosis not present

## 2022-03-21 DIAGNOSIS — S0990XA Unspecified injury of head, initial encounter: Secondary | ICD-10-CM | POA: Diagnosis not present

## 2022-03-21 DIAGNOSIS — G8929 Other chronic pain: Secondary | ICD-10-CM | POA: Diagnosis not present

## 2022-03-21 DIAGNOSIS — J449 Chronic obstructive pulmonary disease, unspecified: Secondary | ICD-10-CM | POA: Diagnosis not present

## 2022-03-21 DIAGNOSIS — G44309 Post-traumatic headache, unspecified, not intractable: Secondary | ICD-10-CM | POA: Diagnosis not present

## 2022-03-21 DIAGNOSIS — J9801 Acute bronchospasm: Secondary | ICD-10-CM | POA: Diagnosis not present

## 2022-03-21 DIAGNOSIS — J069 Acute upper respiratory infection, unspecified: Secondary | ICD-10-CM | POA: Diagnosis not present

## 2022-03-21 DIAGNOSIS — E7800 Pure hypercholesterolemia, unspecified: Secondary | ICD-10-CM | POA: Diagnosis not present

## 2022-03-21 DIAGNOSIS — I10 Essential (primary) hypertension: Secondary | ICD-10-CM | POA: Diagnosis not present

## 2022-04-12 DIAGNOSIS — R7309 Other abnormal glucose: Secondary | ICD-10-CM | POA: Diagnosis not present

## 2022-04-12 DIAGNOSIS — M25562 Pain in left knee: Secondary | ICD-10-CM | POA: Diagnosis not present

## 2022-04-12 DIAGNOSIS — J449 Chronic obstructive pulmonary disease, unspecified: Secondary | ICD-10-CM | POA: Diagnosis not present

## 2022-04-12 DIAGNOSIS — K2960 Other gastritis without bleeding: Secondary | ICD-10-CM | POA: Diagnosis not present

## 2022-04-12 DIAGNOSIS — G8929 Other chronic pain: Secondary | ICD-10-CM | POA: Diagnosis not present

## 2022-04-12 DIAGNOSIS — R030 Elevated blood-pressure reading, without diagnosis of hypertension: Secondary | ICD-10-CM | POA: Diagnosis not present

## 2022-04-12 DIAGNOSIS — F17210 Nicotine dependence, cigarettes, uncomplicated: Secondary | ICD-10-CM | POA: Diagnosis not present

## 2022-04-12 DIAGNOSIS — Z79899 Other long term (current) drug therapy: Secondary | ICD-10-CM | POA: Diagnosis not present

## 2022-04-14 DIAGNOSIS — R030 Elevated blood-pressure reading, without diagnosis of hypertension: Secondary | ICD-10-CM | POA: Diagnosis not present

## 2022-04-14 DIAGNOSIS — J449 Chronic obstructive pulmonary disease, unspecified: Secondary | ICD-10-CM | POA: Diagnosis not present

## 2022-04-14 DIAGNOSIS — G4733 Obstructive sleep apnea (adult) (pediatric): Secondary | ICD-10-CM | POA: Diagnosis not present

## 2022-04-14 DIAGNOSIS — K219 Gastro-esophageal reflux disease without esophagitis: Secondary | ICD-10-CM | POA: Diagnosis not present

## 2022-04-25 DIAGNOSIS — R0902 Hypoxemia: Secondary | ICD-10-CM | POA: Diagnosis not present

## 2022-04-25 DIAGNOSIS — G4736 Sleep related hypoventilation in conditions classified elsewhere: Secondary | ICD-10-CM | POA: Diagnosis not present

## 2022-04-25 DIAGNOSIS — G4733 Obstructive sleep apnea (adult) (pediatric): Secondary | ICD-10-CM | POA: Diagnosis not present

## 2022-05-09 DIAGNOSIS — K219 Gastro-esophageal reflux disease without esophagitis: Secondary | ICD-10-CM | POA: Diagnosis not present

## 2022-05-09 DIAGNOSIS — G4733 Obstructive sleep apnea (adult) (pediatric): Secondary | ICD-10-CM | POA: Diagnosis not present

## 2022-05-09 DIAGNOSIS — R030 Elevated blood-pressure reading, without diagnosis of hypertension: Secondary | ICD-10-CM | POA: Diagnosis not present

## 2022-05-09 DIAGNOSIS — J449 Chronic obstructive pulmonary disease, unspecified: Secondary | ICD-10-CM | POA: Diagnosis not present

## 2022-05-10 DIAGNOSIS — J449 Chronic obstructive pulmonary disease, unspecified: Secondary | ICD-10-CM | POA: Diagnosis not present

## 2022-05-10 DIAGNOSIS — R7309 Other abnormal glucose: Secondary | ICD-10-CM | POA: Diagnosis not present

## 2022-05-10 DIAGNOSIS — J439 Emphysema, unspecified: Secondary | ICD-10-CM | POA: Diagnosis not present

## 2022-05-10 DIAGNOSIS — M25562 Pain in left knee: Secondary | ICD-10-CM | POA: Diagnosis not present

## 2022-05-10 DIAGNOSIS — F17210 Nicotine dependence, cigarettes, uncomplicated: Secondary | ICD-10-CM | POA: Diagnosis not present

## 2022-05-10 DIAGNOSIS — Z79899 Other long term (current) drug therapy: Secondary | ICD-10-CM | POA: Diagnosis not present

## 2022-05-10 DIAGNOSIS — R030 Elevated blood-pressure reading, without diagnosis of hypertension: Secondary | ICD-10-CM | POA: Diagnosis not present

## 2022-05-10 DIAGNOSIS — G4733 Obstructive sleep apnea (adult) (pediatric): Secondary | ICD-10-CM | POA: Diagnosis not present

## 2022-05-10 DIAGNOSIS — G8929 Other chronic pain: Secondary | ICD-10-CM | POA: Diagnosis not present

## 2022-05-10 DIAGNOSIS — K2960 Other gastritis without bleeding: Secondary | ICD-10-CM | POA: Diagnosis not present

## 2022-05-18 DIAGNOSIS — J439 Emphysema, unspecified: Secondary | ICD-10-CM | POA: Diagnosis not present

## 2022-06-02 ENCOUNTER — Emergency Department (HOSPITAL_COMMUNITY)
Admission: EM | Admit: 2022-06-02 | Discharge: 2022-06-02 | Disposition: A | Discharge status: Home / Self Care | Payer: 59 | Attending: Emergency Medicine | Admitting: Emergency Medicine

## 2022-06-02 ENCOUNTER — Other Ambulatory Visit: Payer: Self-pay

## 2022-06-02 ENCOUNTER — Encounter (HOSPITAL_COMMUNITY): Payer: Self-pay

## 2022-06-02 DIAGNOSIS — Z7982 Long term (current) use of aspirin: Secondary | ICD-10-CM | POA: Insufficient documentation

## 2022-06-02 DIAGNOSIS — J449 Chronic obstructive pulmonary disease, unspecified: Secondary | ICD-10-CM | POA: Diagnosis not present

## 2022-06-02 DIAGNOSIS — I10 Essential (primary) hypertension: Secondary | ICD-10-CM | POA: Diagnosis not present

## 2022-06-02 DIAGNOSIS — Z79899 Other long term (current) drug therapy: Secondary | ICD-10-CM | POA: Diagnosis not present

## 2022-06-02 DIAGNOSIS — Z0283 Encounter for blood-alcohol and blood-drug test: Secondary | ICD-10-CM | POA: Insufficient documentation

## 2022-06-02 LAB — RAPID URINE DRUG SCREEN, HOSP PERFORMED
Amphetamines: NOT DETECTED
Barbiturates: NOT DETECTED
Benzodiazepines: NOT DETECTED
Cocaine: NOT DETECTED
Opiates: NOT DETECTED
Tetrahydrocannabinol: NOT DETECTED

## 2022-06-02 NOTE — ED Notes (Signed)
This RN was in the restroom and visualized patient urinate into specimen cup

## 2022-06-02 NOTE — Discharge Instructions (Signed)
The results of your drug test will be found on MyChart.

## 2022-06-02 NOTE — ED Notes (Signed)
Pt given a cup of water to be able to urinate.

## 2022-06-02 NOTE — ED Provider Notes (Signed)
[facility] Provider Note   CSN: [id number] Arrival date & time: 06/02/22  1420     History  Chief Complaint  Patient presents with   Drug / Alcohol Assessment    Brandon Ferrell is a 67 y.o. male.   Drug / Alcohol Assessment Patient presents for drug test.  States he was with his parole officer today and states that on his urine test he came back for amphetamines.  States he has not used amphetamines.  States he used crack cocaine last use 22 years ago.  He is on prescription opiates.  With further history however has been taking Allegra-D which potentially could be false positive for amphetamines.  No complaints.  States he has an appointment with his lawyer this afternoon.    Past Medical History:  Diagnosis Date   Acute renal insufficiency 07/18/2015   Acute urinary retention 07/18/2015   Arthritis    Asthma    as a child   COPD (chronic obstructive pulmonary disease) (HCC)    GERD (gastroesophageal reflux disease)    occ zantac   Hx of adenomatous colonic polyps 08/30/2017   Hyperlipidemia    Hypertension    Palpitations    Pneumonia 2017   hx   Primary localized osteoarthrosis of left shoulder 12/04/2018    Home Medications Prior to Admission medications   Medication Sig Start Date End Date Taking? Authorizing Provider  albuterol (PROVENTIL HFA;VENTOLIN HFA) 108 (90 BASE) MCG/ACT inhaler Inhale 2 puffs into the lungs every 6 (six) hours as needed for wheezing or shortness of breath.    [provider]  amLODipine (NORVASC) 10 MG tablet Take 10 mg by mouth daily.    [provider]  aspirin EC 325 MG tablet Take 1 tablet (325 mg total) by mouth 2 (two) times daily. 07/27/21   Ventura Bruns, PA-C  baclofen (LIORESAL) 10 MG tablet Take 1 tablet (10 mg total) by mouth 3 (three) times daily. As needed for muscle spasm 07/27/21   Merlene Pulling K, PA-C  diphenhydrAMINE (BENADRYL) 25 MG tablet Take 25 mg by mouth  every 6 (six) hours as needed for allergies or sleep.    [provider]  Fluticasone-Umeclidin-Vilant (TRELEGY ELLIPTA) 200-62.5-25 MCG/INH AEPB Inhale 1 Inhaler into the lungs in the morning.    [provider]  gabapentin (NEURONTIN) 300 MG capsule Take 300 mg by mouth at bedtime.  08/15/17   [provider]  HYDROmorphone (DILAUDID) 2 MG tablet Take 1 tablet (2 mg total) by mouth every 4 (four) hours as needed for severe pain. 07/27/21   Ventura Bruns, PA-C  naloxone [facility]) nasal spray 4 mg/0.1 mL SMARTSIG:Both Nares 02/23/21   [provider]  omeprazole (PRILOSEC) 40 MG capsule Take 40 mg by mouth daily.    [provider]  ondansetron (ZOFRAN) 4 MG tablet Take 1 tablet (4 mg total) by mouth every 8 (eight) hours as needed for nausea or vomiting. 07/27/21   Merlene Pulling K, PA-C  Oxycodone HCl 10 MG TABS Take 1 tablet by mouth as needed. [id number] by Dr. Frances Furbish 03/06/19   [provider]  rosuvastatin (CRESTOR) 20 MG tablet Take 20 mg by mouth daily.  07/24/17   [provider]  sennosides-docusate sodium (SENOKOT-S) 8.6-50 MG tablet Take 2 tablets by mouth daily. 07/27/21   Ventura Bruns, PA-C      Allergies    Bee venom and Shellfish allergy    Review of  Systems   Review of Systems  Physical Exam Updated Vital Signs BP (!) 189/93   Pulse 85   Temp 98.1 F (36.7 C) (Oral)   Resp 20   Ht '5\' 11"'$  (1.803 m)   Wt 104.3 kg   SpO2 100%   BMI 32.08 kg/m  Physical Exam Vitals and nursing note reviewed.  Cardiovascular:     Rate and Rhythm: Regular rhythm.  Neurological:     Mental Status: He is alert and oriented to person, place, and time.     ED Results / Procedures / Treatments   Labs (all labs ordered are listed, but only abnormal results are displayed) Labs Reviewed  RAPID URINE DRUG SCREEN, HOSP PERFORMED    EKG None  Radiology No results found.  Procedures Procedures    Medications Ordered in  ED Medications - No data to display  ED Course/ Medical Decision Making/ A&P                             Medical Decision Making Amount and/or Complexity of Data Reviewed Labs: ordered.   Patient resents for drug test.  After discussion with him possible false positive for amphetamines could be the Allegra-D he said he been taking.  Although I cannot tell him whether or not it was a true positive.  Drug screen done here but potentially could have the same result.  Discharge and he can follow through [place] with the results.         Final Clinical Impression(s) / ED Diagnoses Final diagnoses:  Blood drug testing for medicolegal reasons    Rx / DC Orders ED Discharge Orders     None         Davonna Belling, MD 06/02/22 [id number]

## 2022-06-02 NOTE — ED Triage Notes (Signed)
Pt had a failed drug screen when he went to his meeting with the probation officer. Said he was positive for methamphetamines.  Wants to have a screening done to prove that he does not have it in his system now for when he goes to court.

## 2022-06-13 DIAGNOSIS — F17210 Nicotine dependence, cigarettes, uncomplicated: Secondary | ICD-10-CM | POA: Diagnosis not present

## 2022-06-13 DIAGNOSIS — M25562 Pain in left knee: Secondary | ICD-10-CM | POA: Diagnosis not present

## 2022-06-13 DIAGNOSIS — Z79899 Other long term (current) drug therapy: Secondary | ICD-10-CM | POA: Diagnosis not present

## 2022-06-13 DIAGNOSIS — R030 Elevated blood-pressure reading, without diagnosis of hypertension: Secondary | ICD-10-CM | POA: Diagnosis not present

## 2022-06-13 DIAGNOSIS — R7309 Other abnormal glucose: Secondary | ICD-10-CM | POA: Diagnosis not present

## 2022-06-13 DIAGNOSIS — J449 Chronic obstructive pulmonary disease, unspecified: Secondary | ICD-10-CM | POA: Diagnosis not present

## 2022-06-13 DIAGNOSIS — G8929 Other chronic pain: Secondary | ICD-10-CM | POA: Diagnosis not present

## 2022-06-13 DIAGNOSIS — K2960 Other gastritis without bleeding: Secondary | ICD-10-CM | POA: Diagnosis not present

## 2022-06-27 DIAGNOSIS — Z79899 Other long term (current) drug therapy: Secondary | ICD-10-CM | POA: Diagnosis not present

## 2022-06-27 DIAGNOSIS — R030 Elevated blood-pressure reading, without diagnosis of hypertension: Secondary | ICD-10-CM | POA: Diagnosis not present

## 2022-06-27 DIAGNOSIS — M25561 Pain in right knee: Secondary | ICD-10-CM | POA: Diagnosis not present

## 2022-06-29 DIAGNOSIS — M1711 Unilateral primary osteoarthritis, right knee: Secondary | ICD-10-CM | POA: Diagnosis not present

## 2022-07-12 DIAGNOSIS — Z79899 Other long term (current) drug therapy: Secondary | ICD-10-CM | POA: Diagnosis not present

## 2022-07-12 DIAGNOSIS — J449 Chronic obstructive pulmonary disease, unspecified: Secondary | ICD-10-CM | POA: Diagnosis not present

## 2022-07-12 DIAGNOSIS — R030 Elevated blood-pressure reading, without diagnosis of hypertension: Secondary | ICD-10-CM | POA: Diagnosis not present

## 2022-07-12 DIAGNOSIS — F17210 Nicotine dependence, cigarettes, uncomplicated: Secondary | ICD-10-CM | POA: Diagnosis not present

## 2022-07-12 DIAGNOSIS — K2960 Other gastritis without bleeding: Secondary | ICD-10-CM | POA: Diagnosis not present

## 2022-07-12 DIAGNOSIS — Z0000 Encounter for general adult medical examination without abnormal findings: Secondary | ICD-10-CM | POA: Diagnosis not present

## 2022-07-12 DIAGNOSIS — G8929 Other chronic pain: Secondary | ICD-10-CM | POA: Diagnosis not present

## 2022-07-12 DIAGNOSIS — R7309 Other abnormal glucose: Secondary | ICD-10-CM | POA: Diagnosis not present

## 2022-07-12 DIAGNOSIS — M25562 Pain in left knee: Secondary | ICD-10-CM | POA: Diagnosis not present

## 2022-08-08 DIAGNOSIS — R7309 Other abnormal glucose: Secondary | ICD-10-CM | POA: Diagnosis not present

## 2022-08-08 DIAGNOSIS — K2960 Other gastritis without bleeding: Secondary | ICD-10-CM | POA: Diagnosis not present

## 2022-08-08 DIAGNOSIS — G8929 Other chronic pain: Secondary | ICD-10-CM | POA: Diagnosis not present

## 2022-08-08 DIAGNOSIS — M25562 Pain in left knee: Secondary | ICD-10-CM | POA: Diagnosis not present

## 2022-08-08 DIAGNOSIS — Z79899 Other long term (current) drug therapy: Secondary | ICD-10-CM | POA: Diagnosis not present

## 2022-08-08 DIAGNOSIS — J449 Chronic obstructive pulmonary disease, unspecified: Secondary | ICD-10-CM | POA: Diagnosis not present

## 2022-08-08 DIAGNOSIS — F17210 Nicotine dependence, cigarettes, uncomplicated: Secondary | ICD-10-CM | POA: Diagnosis not present

## 2022-08-10 DIAGNOSIS — Z79899 Other long term (current) drug therapy: Secondary | ICD-10-CM | POA: Diagnosis not present

## 2022-09-06 DIAGNOSIS — M25562 Pain in left knee: Secondary | ICD-10-CM | POA: Diagnosis not present

## 2022-09-06 DIAGNOSIS — R030 Elevated blood-pressure reading, without diagnosis of hypertension: Secondary | ICD-10-CM | POA: Diagnosis not present

## 2022-09-06 DIAGNOSIS — J449 Chronic obstructive pulmonary disease, unspecified: Secondary | ICD-10-CM | POA: Diagnosis not present

## 2022-09-06 DIAGNOSIS — G8929 Other chronic pain: Secondary | ICD-10-CM | POA: Diagnosis not present

## 2022-09-06 DIAGNOSIS — R7309 Other abnormal glucose: Secondary | ICD-10-CM | POA: Diagnosis not present

## 2022-09-06 DIAGNOSIS — Z79899 Other long term (current) drug therapy: Secondary | ICD-10-CM | POA: Diagnosis not present

## 2022-09-06 DIAGNOSIS — F17210 Nicotine dependence, cigarettes, uncomplicated: Secondary | ICD-10-CM | POA: Diagnosis not present

## 2022-09-21 DIAGNOSIS — F17210 Nicotine dependence, cigarettes, uncomplicated: Secondary | ICD-10-CM | POA: Diagnosis not present

## 2022-09-21 DIAGNOSIS — J449 Chronic obstructive pulmonary disease, unspecified: Secondary | ICD-10-CM | POA: Diagnosis not present

## 2022-09-21 DIAGNOSIS — R590 Localized enlarged lymph nodes: Secondary | ICD-10-CM | POA: Diagnosis not present

## 2022-09-28 DIAGNOSIS — K648 Other hemorrhoids: Secondary | ICD-10-CM | POA: Diagnosis not present

## 2022-09-28 DIAGNOSIS — R590 Localized enlarged lymph nodes: Secondary | ICD-10-CM | POA: Diagnosis not present

## 2022-09-28 DIAGNOSIS — Z86010 Personal history of colonic polyps: Secondary | ICD-10-CM | POA: Diagnosis not present

## 2022-10-07 DIAGNOSIS — Z87891 Personal history of nicotine dependence: Secondary | ICD-10-CM | POA: Diagnosis not present

## 2022-10-07 DIAGNOSIS — J449 Chronic obstructive pulmonary disease, unspecified: Secondary | ICD-10-CM | POA: Diagnosis not present

## 2022-10-11 DIAGNOSIS — Z79899 Other long term (current) drug therapy: Secondary | ICD-10-CM | POA: Diagnosis not present

## 2022-10-11 DIAGNOSIS — R030 Elevated blood-pressure reading, without diagnosis of hypertension: Secondary | ICD-10-CM | POA: Diagnosis not present

## 2022-10-11 DIAGNOSIS — R7309 Other abnormal glucose: Secondary | ICD-10-CM | POA: Diagnosis not present

## 2022-10-11 DIAGNOSIS — F17210 Nicotine dependence, cigarettes, uncomplicated: Secondary | ICD-10-CM | POA: Diagnosis not present

## 2022-10-11 DIAGNOSIS — M25562 Pain in left knee: Secondary | ICD-10-CM | POA: Diagnosis not present

## 2022-10-11 DIAGNOSIS — G8929 Other chronic pain: Secondary | ICD-10-CM | POA: Diagnosis not present

## 2022-10-13 DIAGNOSIS — Z79899 Other long term (current) drug therapy: Secondary | ICD-10-CM | POA: Diagnosis not present

## 2022-11-07 DIAGNOSIS — G4733 Obstructive sleep apnea (adult) (pediatric): Secondary | ICD-10-CM | POA: Diagnosis not present

## 2022-11-07 DIAGNOSIS — F17210 Nicotine dependence, cigarettes, uncomplicated: Secondary | ICD-10-CM | POA: Diagnosis not present

## 2022-11-07 DIAGNOSIS — J439 Emphysema, unspecified: Secondary | ICD-10-CM | POA: Diagnosis not present

## 2022-11-07 DIAGNOSIS — R030 Elevated blood-pressure reading, without diagnosis of hypertension: Secondary | ICD-10-CM | POA: Diagnosis not present

## 2022-11-11 DIAGNOSIS — R892 Abnormal level of other drugs, medicaments and biological substances in specimens from other organs, systems and tissues: Secondary | ICD-10-CM | POA: Diagnosis not present

## 2022-11-11 DIAGNOSIS — G8929 Other chronic pain: Secondary | ICD-10-CM | POA: Diagnosis not present

## 2022-11-11 DIAGNOSIS — M170 Bilateral primary osteoarthritis of knee: Secondary | ICD-10-CM | POA: Diagnosis not present

## 2022-11-11 DIAGNOSIS — Z131 Encounter for screening for diabetes mellitus: Secondary | ICD-10-CM | POA: Diagnosis not present

## 2022-11-11 DIAGNOSIS — F17210 Nicotine dependence, cigarettes, uncomplicated: Secondary | ICD-10-CM | POA: Diagnosis not present

## 2022-11-11 DIAGNOSIS — Z1159 Encounter for screening for other viral diseases: Secondary | ICD-10-CM | POA: Diagnosis not present

## 2022-11-11 DIAGNOSIS — M25562 Pain in left knee: Secondary | ICD-10-CM | POA: Diagnosis not present

## 2022-11-11 DIAGNOSIS — R369 Urethral discharge, unspecified: Secondary | ICD-10-CM | POA: Diagnosis not present

## 2022-11-11 DIAGNOSIS — E7800 Pure hypercholesterolemia, unspecified: Secondary | ICD-10-CM | POA: Diagnosis not present

## 2022-11-16 DIAGNOSIS — M25562 Pain in left knee: Secondary | ICD-10-CM | POA: Diagnosis not present

## 2022-11-16 DIAGNOSIS — F17210 Nicotine dependence, cigarettes, uncomplicated: Secondary | ICD-10-CM | POA: Diagnosis not present

## 2022-11-16 DIAGNOSIS — R7303 Prediabetes: Secondary | ICD-10-CM | POA: Diagnosis not present

## 2022-11-16 DIAGNOSIS — Z0130 Encounter for examination of blood pressure without abnormal findings: Secondary | ICD-10-CM | POA: Diagnosis not present

## 2022-11-16 DIAGNOSIS — Z122 Encounter for screening for malignant neoplasm of respiratory organs: Secondary | ICD-10-CM | POA: Diagnosis not present

## 2022-11-16 DIAGNOSIS — G8929 Other chronic pain: Secondary | ICD-10-CM | POA: Diagnosis not present

## 2022-11-16 DIAGNOSIS — M170 Bilateral primary osteoarthritis of knee: Secondary | ICD-10-CM | POA: Diagnosis not present

## 2022-11-16 DIAGNOSIS — R892 Abnormal level of other drugs, medicaments and biological substances in specimens from other organs, systems and tissues: Secondary | ICD-10-CM | POA: Diagnosis not present

## 2022-11-18 DIAGNOSIS — K648 Other hemorrhoids: Secondary | ICD-10-CM | POA: Diagnosis not present

## 2022-11-18 DIAGNOSIS — K635 Polyp of colon: Secondary | ICD-10-CM | POA: Diagnosis not present

## 2022-11-18 DIAGNOSIS — Z86010 Personal history of colonic polyps: Secondary | ICD-10-CM | POA: Diagnosis not present

## 2022-11-18 DIAGNOSIS — Z1211 Encounter for screening for malignant neoplasm of colon: Secondary | ICD-10-CM | POA: Diagnosis not present

## 2022-11-24 DIAGNOSIS — Z136 Encounter for screening for cardiovascular disorders: Secondary | ICD-10-CM | POA: Diagnosis not present

## 2022-12-15 DIAGNOSIS — Z789 Other specified health status: Secondary | ICD-10-CM | POA: Diagnosis not present

## 2022-12-15 DIAGNOSIS — F17210 Nicotine dependence, cigarettes, uncomplicated: Secondary | ICD-10-CM | POA: Diagnosis not present

## 2022-12-15 DIAGNOSIS — R030 Elevated blood-pressure reading, without diagnosis of hypertension: Secondary | ICD-10-CM | POA: Diagnosis not present

## 2022-12-15 DIAGNOSIS — R7303 Prediabetes: Secondary | ICD-10-CM | POA: Diagnosis not present

## 2022-12-15 DIAGNOSIS — R892 Abnormal level of other drugs, medicaments and biological substances in specimens from other organs, systems and tissues: Secondary | ICD-10-CM | POA: Diagnosis not present

## 2022-12-15 DIAGNOSIS — M25562 Pain in left knee: Secondary | ICD-10-CM | POA: Diagnosis not present

## 2022-12-15 DIAGNOSIS — G8929 Other chronic pain: Secondary | ICD-10-CM | POA: Diagnosis not present

## 2022-12-15 DIAGNOSIS — M170 Bilateral primary osteoarthritis of knee: Secondary | ICD-10-CM | POA: Diagnosis not present

## 2022-12-19 DIAGNOSIS — K648 Other hemorrhoids: Secondary | ICD-10-CM | POA: Diagnosis not present

## 2022-12-19 DIAGNOSIS — D126 Benign neoplasm of colon, unspecified: Secondary | ICD-10-CM | POA: Diagnosis not present

## 2022-12-19 DIAGNOSIS — F17200 Nicotine dependence, unspecified, uncomplicated: Secondary | ICD-10-CM | POA: Diagnosis not present

## 2022-12-19 DIAGNOSIS — E7800 Pure hypercholesterolemia, unspecified: Secondary | ICD-10-CM | POA: Diagnosis not present

## 2022-12-19 DIAGNOSIS — R030 Elevated blood-pressure reading, without diagnosis of hypertension: Secondary | ICD-10-CM | POA: Diagnosis not present

## 2022-12-20 DIAGNOSIS — G8929 Other chronic pain: Secondary | ICD-10-CM | POA: Diagnosis not present

## 2022-12-20 DIAGNOSIS — M25562 Pain in left knee: Secondary | ICD-10-CM | POA: Diagnosis not present

## 2022-12-20 DIAGNOSIS — F17210 Nicotine dependence, cigarettes, uncomplicated: Secondary | ICD-10-CM | POA: Diagnosis not present

## 2022-12-20 DIAGNOSIS — R030 Elevated blood-pressure reading, without diagnosis of hypertension: Secondary | ICD-10-CM | POA: Diagnosis not present

## 2022-12-20 DIAGNOSIS — I10 Essential (primary) hypertension: Secondary | ICD-10-CM | POA: Diagnosis not present

## 2022-12-20 DIAGNOSIS — E785 Hyperlipidemia, unspecified: Secondary | ICD-10-CM | POA: Diagnosis not present

## 2022-12-20 DIAGNOSIS — M170 Bilateral primary osteoarthritis of knee: Secondary | ICD-10-CM | POA: Diagnosis not present

## 2022-12-20 DIAGNOSIS — R7303 Prediabetes: Secondary | ICD-10-CM | POA: Diagnosis not present

## 2023-01-18 ENCOUNTER — Other Ambulatory Visit: Payer: Self-pay

## 2023-01-18 ENCOUNTER — Ambulatory Visit
Admission: EM | Admit: 2023-01-18 | Discharge: 2023-01-18 | Disposition: A | Discharge status: Home / Self Care | Payer: 59 | Attending: Nurse Practitioner | Admitting: Nurse Practitioner

## 2023-01-18 DIAGNOSIS — R030 Elevated blood-pressure reading, without diagnosis of hypertension: Secondary | ICD-10-CM | POA: Diagnosis not present

## 2023-01-18 DIAGNOSIS — R0789 Other chest pain: Secondary | ICD-10-CM

## 2023-01-18 DIAGNOSIS — H18891 Other specified disorders of cornea, right eye: Secondary | ICD-10-CM | POA: Diagnosis not present

## 2023-01-18 MED ORDER — OLOPATADINE HCL 0.1 % OP SOLN: 1.0000 [drp] | Freq: Two times a day (BID) | OPHTHALMIC | 0 refills | Status: AC

## 2023-01-18 NOTE — ED Provider Notes (Signed)
[facility]    CSN: [id number] Arrival date & time: 01/18/23  1040      History   Chief Complaint No chief complaint on file.   HPI Brandon Ferrell is a 67 y.o. male.   The history is provided by the patient.   Patient presents for complaints of elevated blood pressure that started over the past several days.  States he became more concerned when the "bottom number" of his blood pressure reached the 90s.  He states that he is also having some pain around the left side of his heart and felt lightheaded.  He denies fever, chills, headaches, dizziness, shortness of breath, difficulty breathing, diaphoresis, nausea, vomiting, or radiation of pain into the jaw or upper extremity.  He states he was told by one of his physicians in the past that he did have an area "around the heart that was weak", but states that was never followed up on.  Patient reports that around the same time, he started having issues of redness, tearing, and itching in the right eye.  States that he has been coughing and sneezing more frequently over the past several days.  Reports history of seasonal allergies, states that he takes Benadryl or Allegra as needed.  Denies visual changes, eye pain, purulent drainage from the eye.patient currently is wearing glasses.  Patient reports history of COPD, he currently is still smoking.  He also has OSA, reports that he does wear his CPAP 5/7 days/week.  Review of the patient's chart also shows history of hypertension, hyperlipidemia, and presbyopia.   Patient Active Problem List   Diagnosis Date Noted   Osteoarthritis of left knee 07/14/2021   Primary localized osteoarthrosis of left shoulder 12/04/2018   Localized primary osteoarthritis of left shoulder region 12/04/2018   Hx of adenomatous colonic polyps 08/30/2017   Degenerative joint disease of left hip 07/17/2015   Primary localized osteoarthritis of right hip 11/07/2014   Hyperlipidemia 06/21/2006   TOBACCO  ABUSE 06/21/2006   PRESBYOPIA 06/21/2006   Essential hypertension 06/21/2006   INSOMNIA 06/21/2006    Past Surgical History:  Procedure Laterality Date   BICEPT TENODESIS Left 08/13/2020   Procedure: OPEN BICEPS TENODESIS;  Surgeon: Teryl Lucy, MD;  Location: [facility];  Service: Orthopedics;  Laterality: Left;   CERVICAL FUSION     GROIN EXPLORATION Right 07/03/15   cyst removed- local anesthesia [place]   LUNG BIOPSY  13   pneum, dx copd   PARTIAL KNEE ARTHROPLASTY Left 07/27/2021   Procedure: UNICOMPARTMENTAL KNEE;  Surgeon: Teryl Lucy, MD;  Location: [facility];  Service: Orthopedics;  Laterality: Left;   TOTAL HIP ARTHROPLASTY Right 11/07/2014   Procedure: RIGHT  TOTAL HIP ARTHROPLASTY;  Surgeon: Frederico Hamman, MD;  Location: [facility];  Service: Orthopedics;  Laterality: Right;  Would like to flip if possible     TOTAL HIP ARTHROPLASTY Left 07/17/2015   Procedure: LEFT TOTAL HIP ARTHROPLASTY;  Surgeon: Frederico Hamman, MD;  Location: [facility];  Service: Orthopedics;  Laterality: Left;   TOTAL SHOULDER ARTHROPLASTY Left 12/04/2018   Procedure: TOTAL SHOULDER ARTHROPLASTY;  Surgeon: Teryl Lucy, MD;  Location: [facility];  Service: Orthopedics;  Laterality: Left;   VASECTOMY  5/15       Home Medications    Prior to Admission medications   Medication Sig Start Date End Date Taking? Authorizing Provider  olopatadine (PATADAY) 0.1 % ophthalmic solution Place 1 drop into both eyes 2 (two) times daily. 01/18/23  Yes Marcelle Bebout-Warren, Lorene Dy  J, NP  albuterol (PROVENTIL HFA;VENTOLIN HFA) 108 (90 BASE) MCG/ACT inhaler Inhale 2 puffs into the lungs every 6 (six) hours as needed for wheezing or shortness of breath.    [provider]  amLODipine (NORVASC) 10 MG tablet Take 10 mg by mouth daily.    [provider]  aspirin EC 325 MG tablet Take 1 tablet (325 mg total) by mouth 2 (two) times daily. 07/27/21   Armida Sans, PA-C  baclofen (LIORESAL) 10 MG tablet  Take 1 tablet (10 mg total) by mouth 3 (three) times daily. As needed for muscle spasm 07/27/21   Janine Ores K, PA-C  diphenhydrAMINE (BENADRYL) 25 MG tablet Take 25 mg by mouth every 6 (six) hours as needed for allergies or sleep.    [provider]  Fluticasone-Umeclidin-Vilant (TRELEGY ELLIPTA) 200-62.5-25 MCG/INH AEPB Inhale 1 Inhaler into the lungs in the morning.    [provider]  gabapentin (NEURONTIN) 300 MG capsule Take 300 mg by mouth at bedtime.  08/15/17   [provider]  HYDROmorphone (DILAUDID) 2 MG tablet Take 1 tablet (2 mg total) by mouth every 4 (four) hours as needed for severe pain. 07/27/21   Armida Sans, PA-C  naloxone [facility]) nasal spray 4 mg/0.1 mL SMARTSIG:Both Nares 02/23/21   [provider]  omeprazole (PRILOSEC) 40 MG capsule Take 40 mg by mouth daily.    [provider]  ondansetron (ZOFRAN) 4 MG tablet Take 1 tablet (4 mg total) by mouth every 8 (eight) hours as needed for nausea or vomiting. 07/27/21   Janine Ores K, PA-C  Oxycodone HCl 10 MG TABS Take 1 tablet by mouth as needed. RX'd by Dr. Roxy Horseman 03/06/19   [provider]  rosuvastatin (CRESTOR) 20 MG tablet Take 20 mg by mouth daily.  07/24/17   [provider]  sennosides-docusate sodium (SENOKOT-S) 8.6-50 MG tablet Take 2 tablets by mouth daily. 07/27/21   Armida Sans, PA-C    Family History Family History  Problem Relation Age of Onset   Colon cancer Maternal Grandfather    Stomach cancer Father    Colon polyps Neg Hx    Esophageal cancer Neg Hx    Rectal cancer Neg Hx     Social History Social History   Tobacco Use   Smoking status: Some Days    Current packs/day: 1.00    Average packs/day: 1 pack/day for 40.0 years (40.0 ttl pk-yrs)    Types: Cigars, Cigarettes   Smokeless tobacco: Never   Tobacco comments:    5 cigars daily  Vaping Use   Vaping status: Never Used  Substance Use Topics   Alcohol use: Yes     Comment: occas.   Drug use: No     Allergies   Bee venom and Shellfish allergy   Review of Systems Review of Systems Per HPI  Physical Exam Triage Vital Signs ED Triage Vitals  Encounter Vitals Group     BP 01/18/23 1047 (!) 154/81     Systolic BP Percentile --      Diastolic BP Percentile --      Pulse Rate 01/18/23 1047 74     Resp 01/18/23 1047 16     Temp 01/18/23 1047 97.9 F (36.6 C)     Temp Source 01/18/23 1047 Oral     SpO2 01/18/23 1047 97 %     Weight --      Height --      Head Circumference --  Peak Flow --      Pain Score 01/18/23 1050 0     Pain Loc --      Pain Education --      Exclude from Growth Chart --    No data found.  Updated Vital Signs BP (!) 149/80 (BP Location: Right Arm)   Pulse 74   Temp 97.9 F (36.6 C) (Oral)   Resp 16   SpO2 97%   Visual Acuity Right Eye Distance:   Left Eye Distance:   Bilateral Distance:    Right Eye Near:   Left Eye Near:    Bilateral Near:     Physical Exam Vitals and nursing note reviewed.  Constitutional:      General: He is not in acute distress.    Appearance: Normal appearance.  HENT:     Head: Normocephalic.     Right Ear: Tympanic membrane, ear canal and external ear normal.     Left Ear: Tympanic membrane, ear canal and external ear normal.     Mouth/Throat:     Mouth: Mucous membranes are moist.     Pharynx: Posterior oropharyngeal erythema present.     Comments: Cobblestoning present to posterior oropharynx Eyes:     General: Lids are normal. Vision grossly intact.     Extraocular Movements: Extraocular movements intact.     Right eye: Normal extraocular motion and no nystagmus.     Conjunctiva/sclera:     Right eye: Right conjunctiva is injected. No chemosis.    Pupils: Pupils are equal, round, and reactive to light.  Cardiovascular:     Rate and Rhythm: Normal rate and regular rhythm.     Pulses: Normal pulses.     Heart sounds: Normal heart sounds.  Pulmonary:      Effort: Pulmonary effort is normal. No respiratory distress.     Breath sounds: Normal breath sounds. No stridor. No wheezing, rhonchi or rales.  Abdominal:     General: Bowel sounds are normal.     Palpations: Abdomen is soft.     Tenderness: There is no abdominal tenderness.  Musculoskeletal:     Cervical back: Normal range of motion.  Skin:    General: Skin is warm and dry.  Neurological:     General: No focal deficit present.     Mental Status: He is alert and oriented to person, place, and time.  Psychiatric:        Mood and Affect: Mood normal.        Behavior: Behavior normal.      UC Treatments / Results  Labs (all labs ordered are listed, but only abnormal results are displayed) Labs Reviewed - No data to display  EKG: Normal sinus rhythm with T wave abnormality, no STEMI.  RBBB seen in aVF, consistent with previous EKGs dated 07/14/2021 and 04/30/2015.   Radiology No results found.  Procedures Procedures (including critical care time)  Medications Ordered in UC Medications - No data to display  Initial Impression / Assessment and Plan / UC Course  I have reviewed the triage vital signs and the nursing notes.  Pertinent labs & imaging results that were available during my care of the patient were reviewed by me and considered in my medical decision making (see chart for details).  The patient is well-appearing, he is in no acute distress, he is hypertensive.  Initial BP 154/81, repeat BP 149/80.  Patient presents for complaints of redness in the right eye and elevated BP.  EKG shows  normal sinus rhythm with T wave abnormality, no STEMI.  With regard to his blood pressure, repeat BP with slight improvement.  Discussion with patient to continue current medications for his blood pressure, also discussed with patient to consider smoking cessation of smoking is impacting his blood pressure and COPD.  With regard to his eye, suspect allergic conjunctivitis.  Will treat  with Pataday eyedrops.  Supportive care recommendations were provided and discussed with the patient to include continuing to monitor his blood pressure at home, eating a healthy diet that is low in sodium and fat, increasing his water intake, and allowing for plenty of rest.  With regard to his eye, warm or cool compresses as needed for pain or discomfort, avoiding rubbing or manipulating the eye, and continuing his current allergy medications.  Patient was given strict ER follow-up precautions.  Patient is scheduled to see his PCP on 01/19/2023, keep appointment as scheduled.  Patient is in agreement with this plan of care and verbalizes understanding.  All questions were answered.  Patient stable for discharge.  Final Clinical Impressions(s) / UC Diagnoses   Final diagnoses:  Corneal erythema of right eye  Elevated blood pressure reading  Chest discomfort     Discharge Instructions      Your EKG was normal.  Continue to monitor your blood pressure at home.  Continue your current blood pressure medications. Recommend a diet that is low in sodium and low in fat. Make sure you are drinking at least 8-10 8 ounce glasses of water daily. Follow-up with your PCP as scheduled on 01/19/2023. Go to the emergency department immediately if you are experiencing shortness of breath, difficulty breathing, chest pain, lightheadedness, dizziness, swelling in your legs or feet, or other concerns.  For your eye: Administer medication as prescribed. May apply warm or cool compresses as needed.  Apply cool compresses for pain and swelling, warm compresses for discomfort. Avoid rubbing, or manipulating the eye while symptoms persist. Continue your current allergy medications. Follow-up with your PCP as scheduled on 01/19/2023 for reevaluation.  Follow-up as needed.     ED Prescriptions     Medication Sig Dispense Auth. Provider   olopatadine (PATADAY) 0.1 % ophthalmic solution Place 1 drop into both  eyes 2 (two) times daily. 5 mL Edie Darley-Warren, Sadie Haber, NP      PDMP not reviewed this encounter.   Abran Cantor, NP 01/18/23 1527

## 2023-01-18 NOTE — ED Triage Notes (Addendum)
Pt c/o elevated blood pressure, states last night he took his BP it was 157/98, states he has blurred vision in his right eye and thinks it is related to his BP being elevated. And that he is having aches in his chest around his heart and feeling like he will pass out. X 3 days denies any headaches, dizziness. Pt states he notices these sx's when his BP is elevated.    Pt is that only one c/o may be discussed today during visit.

## 2023-01-18 NOTE — Discharge Instructions (Addendum)
Your EKG was normal.  Continue to monitor your blood pressure at home.  Continue your current blood pressure medications. Recommend a diet that is low in sodium and low in fat. Make sure you are drinking at least 8-10 8 ounce glasses of water daily. Follow-up with your PCP as scheduled on 01/19/2023. Go to the emergency department immediately if you are experiencing shortness of breath, difficulty breathing, chest pain, lightheadedness, dizziness, swelling in your legs or feet, or other concerns.  For your eye: Administer medication as prescribed. May apply warm or cool compresses as needed.  Apply cool compresses for pain and swelling, warm compresses for discomfort. Avoid rubbing, or manipulating the eye while symptoms persist. Continue your current allergy medications. Follow-up with your PCP as scheduled on 01/19/2023 for reevaluation.  Follow-up as needed.

## 2023-01-19 DIAGNOSIS — R7303 Prediabetes: Secondary | ICD-10-CM | POA: Diagnosis not present

## 2023-01-19 DIAGNOSIS — J069 Acute upper respiratory infection, unspecified: Secondary | ICD-10-CM | POA: Diagnosis not present

## 2023-01-19 DIAGNOSIS — Z789 Other specified health status: Secondary | ICD-10-CM | POA: Diagnosis not present

## 2023-01-19 DIAGNOSIS — F17210 Nicotine dependence, cigarettes, uncomplicated: Secondary | ICD-10-CM | POA: Diagnosis not present

## 2023-01-19 DIAGNOSIS — M25562 Pain in left knee: Secondary | ICD-10-CM | POA: Diagnosis not present

## 2023-01-19 DIAGNOSIS — R030 Elevated blood-pressure reading, without diagnosis of hypertension: Secondary | ICD-10-CM | POA: Diagnosis not present

## 2023-01-19 DIAGNOSIS — G8929 Other chronic pain: Secondary | ICD-10-CM | POA: Diagnosis not present

## 2023-01-19 DIAGNOSIS — M170 Bilateral primary osteoarthritis of knee: Secondary | ICD-10-CM | POA: Diagnosis not present

## 2023-01-19 DIAGNOSIS — Z136 Encounter for screening for cardiovascular disorders: Secondary | ICD-10-CM | POA: Diagnosis not present

## 2023-01-31 ENCOUNTER — Encounter (HOSPITAL_COMMUNITY): Payer: Self-pay

## 2023-01-31 ENCOUNTER — Emergency Department (HOSPITAL_COMMUNITY)
Admission: EM | Admit: 2023-01-31 | Discharge: 2023-02-01 | Disposition: A | Discharge status: Home / Self Care | Payer: 59 | Attending: Emergency Medicine | Admitting: Emergency Medicine

## 2023-01-31 ENCOUNTER — Other Ambulatory Visit: Payer: Self-pay

## 2023-01-31 ENCOUNTER — Emergency Department (HOSPITAL_COMMUNITY): Payer: 59

## 2023-01-31 DIAGNOSIS — K769 Liver disease, unspecified: Secondary | ICD-10-CM | POA: Diagnosis not present

## 2023-01-31 DIAGNOSIS — D696 Thrombocytopenia, unspecified: Secondary | ICD-10-CM | POA: Diagnosis not present

## 2023-01-31 DIAGNOSIS — Z7982 Long term (current) use of aspirin: Secondary | ICD-10-CM | POA: Insufficient documentation

## 2023-01-31 DIAGNOSIS — K5900 Constipation, unspecified: Secondary | ICD-10-CM

## 2023-01-31 DIAGNOSIS — K4090 Unilateral inguinal hernia, without obstruction or gangrene, not specified as recurrent: Secondary | ICD-10-CM | POA: Diagnosis not present

## 2023-01-31 DIAGNOSIS — N289 Disorder of kidney and ureter, unspecified: Secondary | ICD-10-CM | POA: Diagnosis not present

## 2023-01-31 DIAGNOSIS — Z79899 Other long term (current) drug therapy: Secondary | ICD-10-CM | POA: Diagnosis not present

## 2023-01-31 DIAGNOSIS — I10 Essential (primary) hypertension: Secondary | ICD-10-CM | POA: Insufficient documentation

## 2023-01-31 DIAGNOSIS — R0989 Other specified symptoms and signs involving the circulatory and respiratory systems: Secondary | ICD-10-CM | POA: Diagnosis not present

## 2023-01-31 DIAGNOSIS — R1084 Generalized abdominal pain: Secondary | ICD-10-CM

## 2023-01-31 DIAGNOSIS — K429 Umbilical hernia without obstruction or gangrene: Secondary | ICD-10-CM | POA: Diagnosis not present

## 2023-01-31 DIAGNOSIS — K5730 Diverticulosis of large intestine without perforation or abscess without bleeding: Secondary | ICD-10-CM | POA: Diagnosis not present

## 2023-01-31 DIAGNOSIS — R0602 Shortness of breath: Secondary | ICD-10-CM | POA: Diagnosis not present

## 2023-01-31 LAB — COMPREHENSIVE METABOLIC PANEL
ALT: 31 U/L (ref 0–44)
AST: 15 U/L (ref 15–41)
Albumin: 3.7 g/dL (ref 3.5–5.0)
Alkaline Phosphatase: 69 U/L (ref 38–126)
Anion gap: 10 (ref 5–15)
BUN: 18 mg/dL (ref 8–23)
CO2: 25 mmol/L (ref 22–32)
Calcium: 9.1 mg/dL (ref 8.9–10.3)
Chloride: 105 mmol/L (ref 98–111)
Creatinine, Ser: 1.36 mg/dL — ABNORMAL HIGH (ref 0.61–1.24)
GFR, Estimated: 57 mL/min — ABNORMAL LOW (ref >60)
Glucose, Bld: 115 mg/dL — ABNORMAL HIGH (ref 70–99)
Potassium: 4.2 mmol/L (ref 3.5–5.1)
Sodium: 140 mmol/L (ref 135–145)
Total Bilirubin: 0.4 mg/dL (ref 0.3–1.2)
Total Protein: 7.1 g/dL (ref 6.5–8.1)

## 2023-01-31 LAB — URINALYSIS, ROUTINE W REFLEX MICROSCOPIC
Bilirubin Urine: NEGATIVE
Glucose, UA: NEGATIVE mg/dL
Hgb urine dipstick: NEGATIVE
Ketones, ur: NEGATIVE mg/dL
Leukocytes,Ua: NEGATIVE
Nitrite: NEGATIVE
Protein, ur: NEGATIVE mg/dL
Specific Gravity, Urine: 1.024 (ref 1.005–1.030)
pH: 6 (ref 5.0–8.0)

## 2023-01-31 LAB — TROPONIN I (HIGH SENSITIVITY): Troponin I (High Sensitivity): 4 ng/L (ref <18)

## 2023-01-31 LAB — CBC
HCT: 40.4 % (ref 39.0–52.0)
Hemoglobin: 13.8 g/dL (ref 13.0–17.0)
MCH: 31.3 pg (ref 26.0–34.0)
MCHC: 34.2 g/dL (ref 30.0–36.0)
MCV: 91.6 fL (ref 80.0–100.0)
Platelets: 146 10*3/uL — ABNORMAL LOW (ref 150–400)
RBC: 4.41 MIL/uL (ref 4.22–5.81)
RDW: 14 % (ref 11.5–15.5)
WBC: 11.8 10*3/uL — ABNORMAL HIGH (ref 4.0–10.5)
nRBC: 0 % (ref 0.0–0.2)

## 2023-01-31 LAB — LIPASE, BLOOD: Lipase: 38 U/L (ref 11–51)

## 2023-01-31 MED ORDER — ALUM & MAG HYDROXIDE-SIMETH 200-200-20 MG/5ML PO SUSP
30.0000 mL | Freq: Once | ORAL | Status: AC
  Administered 2023-01-31: 30 mL via ORAL
  Filled 2023-01-31: qty 30

## 2023-01-31 MED ORDER — IOHEXOL 300 MG/ML  SOLN
100.0000 mL | Freq: Once | INTRAMUSCULAR | Status: AC | PRN
  Administered 2023-01-31: 100 mL via INTRAVENOUS

## 2023-01-31 MED ORDER — FAMOTIDINE IN NACL 20-0.9 MG/50ML-% IV SOLN
20.0000 mg | Freq: Once | INTRAVENOUS | Status: AC
  Administered 2023-01-31: 20 mg via INTRAVENOUS
  Filled 2023-01-31: qty 50

## 2023-01-31 MED ORDER — LIDOCAINE VISCOUS HCL 2 % MT SOLN
15.0000 mL | Freq: Once | OROMUCOSAL | Status: AC
  Administered 2023-01-31: 15 mL via ORAL
  Filled 2023-01-31: qty 15

## 2023-01-31 NOTE — ED Provider Notes (Signed)
[place] EMERGENCY DEPARTMENT AT [facility] Provider Note   CSN: [id number] Arrival date & time: 01/31/23  1854     History {Add pertinent medical, surgical, social history, OB history to HPI:1} Chief Complaint  Patient presents with   Shortness of Breath   Abdominal Pain    Brandon Ferrell is a 67 y.o. male.  67 year old male with a history of GERD, hypertension, and hyperlipidemia who presents to the emergency department with abdominal swelling and shortness of breath.  Patient reports that on Sunday he started experiencing early satiety.  After that started experiencing abdominal swelling and then some shortness of breath today from the abdominal swelling.  No fevers.  Nausea but no vomiting.  Feels heartburn but no chest pain.  Says that he has been having "small turds" but no normal bowel movements.  Did pass gas this morning.  No abdominal surgeries.  Says he has a chronic cough without any recent changes.       Home Medications Prior to Admission medications   Medication Sig Start Date End Date Taking? Authorizing Provider  albuterol (PROVENTIL HFA;VENTOLIN HFA) 108 (90 BASE) MCG/ACT inhaler Inhale 2 puffs into the lungs every 6 (six) hours as needed for wheezing or shortness of breath.    [provider]  amLODipine (NORVASC) 10 MG tablet Take 10 mg by mouth daily.    [provider]  aspirin EC 325 MG tablet Take 1 tablet (325 mg total) by mouth 2 (two) times daily. 07/27/21   Armida Sans, PA-C  baclofen (LIORESAL) 10 MG tablet Take 1 tablet (10 mg total) by mouth 3 (three) times daily. As needed for muscle spasm 07/27/21   Janine Ores K, PA-C  diphenhydrAMINE (BENADRYL) 25 MG tablet Take 25 mg by mouth every 6 (six) hours as needed for allergies or sleep.    [provider]  Fluticasone-Umeclidin-Vilant (TRELEGY ELLIPTA) 200-62.5-25 MCG/INH AEPB Inhale 1 Inhaler into the lungs in the morning.    [provider]   gabapentin (NEURONTIN) 300 MG capsule Take 300 mg by mouth at bedtime.  08/15/17   [provider]  HYDROmorphone (DILAUDID) 2 MG tablet Take 1 tablet (2 mg total) by mouth every 4 (four) hours as needed for severe pain. 07/27/21   Armida Sans, PA-C  naloxone [facility]) nasal spray 4 mg/0.1 mL SMARTSIG:Both Nares 02/23/21   [provider]  olopatadine (PATADAY) 0.1 % ophthalmic solution Place 1 drop into both eyes 2 (two) times daily. 01/18/23   Leath-Warren, Sadie Haber, NP  omeprazole (PRILOSEC) 40 MG capsule Take 40 mg by mouth daily.    [provider]  ondansetron (ZOFRAN) 4 MG tablet Take 1 tablet (4 mg total) by mouth every 8 (eight) hours as needed for nausea or vomiting. 07/27/21   Janine Ores K, PA-C  Oxycodone HCl 10 MG TABS Take 1 tablet by mouth as needed. RX'd by Dr. Roxy Horseman 03/06/19   [provider]  rosuvastatin (CRESTOR) 20 MG tablet Take 20 mg by mouth daily.  07/24/17   [provider]  sennosides-docusate sodium (SENOKOT-S) 8.6-50 MG tablet Take 2 tablets by mouth daily. 07/27/21   Armida Sans, PA-C      Allergies    Bee venom and Shellfish allergy    Review of Systems   Review of Systems  Physical Exam Updated Vital Signs BP 133/76   Pulse (!) 56   Temp 99.1 F (37.3 C) (Oral)   Resp 16   Ht 5'  11" (1.803 m)   Wt 104.3 kg   SpO2 95%   BMI 32.08 kg/m  Physical Exam Vitals and nursing note reviewed.  Constitutional:      General: He is not in acute distress.    Appearance: He is well-developed.  HENT:     Head: Normocephalic and atraumatic.     Right Ear: External ear normal.     Left Ear: External ear normal.     Nose: Nose normal.  Eyes:     Extraocular Movements: Extraocular movements intact.     Conjunctiva/sclera: Conjunctivae normal.     Pupils: Pupils are equal, round, and reactive to light.  Cardiovascular:     Rate and Rhythm: Normal rate and regular rhythm.     Heart sounds: Normal heart  sounds.  Pulmonary:     Effort: Pulmonary effort is normal. No respiratory distress.     Breath sounds: Normal breath sounds.  Abdominal:     General: There is distension.     Palpations: Abdomen is soft. There is no mass.     Tenderness: There is no abdominal tenderness. There is no guarding.  Musculoskeletal:     Cervical back: Normal range of motion and neck supple.     Right lower leg: No edema.     Left lower leg: No edema.  Skin:    General: Skin is warm and dry.  Neurological:     Mental Status: He is alert. Mental status is at baseline.  Psychiatric:        Mood and Affect: Mood normal.        Behavior: Behavior normal.     ED Results / Procedures / Treatments   Labs (all labs ordered are listed, but only abnormal results are displayed) Labs Reviewed  COMPREHENSIVE METABOLIC PANEL - Abnormal; Notable for the following components:      Result Value   Glucose, Bld 115 (*)    Creatinine, Ser 1.36 (*)    GFR, Estimated 57 (*)    All other components within normal limits  CBC - Abnormal; Notable for the following components:   WBC 11.8 (*)    Platelets 146 (*)    All other components within normal limits  LIPASE, BLOOD  URINALYSIS, ROUTINE W REFLEX MICROSCOPIC  TROPONIN I (HIGH SENSITIVITY)    EKG EKG Interpretation Date/Time:  Tuesday January 31 2023 19:08:43 EDT Ventricular Rate:  62 PR Interval:  136 QRS Duration:  86 QT Interval:  388 QTC Calculation: 393 R Axis:   16  Text Interpretation: Normal sinus rhythm Nonspecific T wave abnormality Abnormal ECG Confirmed by Vonita Moss [phone]) on 01/31/2023 7:14:26 PM  Radiology No results found.  Procedures Procedures  {Document cardiac monitor, telemetry assessment procedure when appropriate:1}  Medications Ordered in ED Medications  famotidine (PEPCID) IVPB 20 mg premix (20 mg Intravenous New Bag/Given 01/31/23 2222)  iohexol (OMNIPAQUE) 300 MG/ML solution 100 mL (has no administration in time  range)  alum & mag hydroxide-simeth (MAALOX/MYLANTA) 200-200-20 MG/5ML suspension 30 mL (30 mLs Oral Given 01/31/23 2222)    And  lidocaine (XYLOCAINE) 2 % viscous mouth solution 15 mL (15 mLs Oral Given 01/31/23 2222)    ED Course/ Medical Decision Making/ A&P   {   Click here for ABCD2, HEART and other calculatorsREFRESH Note before signing :1}                              Medical Decision  Making Amount and/or Complexity of Data Reviewed Labs: ordered. Radiology: ordered. ECG/medicine tests: ordered.  Risk OTC drugs. Prescription drug management.   ***  {Document critical care time when appropriate:1} {Document review of labs and clinical decision tools ie heart score, Chads2Vasc2 etc:1}  {Document your independent review of radiology images, and any outside records:1} {Document your discussion with family members, caretakers, and with consultants:1} {Document social determinants of health affecting pt's care:1} {Document your decision making why or why not admission, treatments were needed:1} Final Clinical Impression(s) / ED Diagnoses Final diagnoses:  None    Rx / DC Orders ED Discharge Orders     None

## 2023-01-31 NOTE — ED Triage Notes (Signed)
SOB started this morning  ABD pain started yesterday and he feels bloated.  Pt stated that he is peeing and pooping normal  Last BM yesterday morning

## 2023-02-01 DIAGNOSIS — K5900 Constipation, unspecified: Secondary | ICD-10-CM | POA: Diagnosis not present

## 2023-02-01 MED ORDER — BISACODYL 5 MG PO TBEC
5.0000 mg | DELAYED_RELEASE_TABLET | Freq: Once | ORAL | Status: AC
  Administered 2023-02-01: 5 mg via ORAL
  Filled 2023-02-01: qty 1

## 2023-02-01 MED ORDER — POLYETHYLENE GLYCOL 3350 17 G PO PACK: 17.0000 g | PACK | Freq: Every day | ORAL | 0 refills | Status: AC

## 2023-02-01 NOTE — Discharge Instructions (Addendum)
You were seen for your constipation in the emergency department.   At home, please take MiraLAX daily until you start having regular bowel movements.  You can adjust the dose of MiraLAX until you get the desired effect.  Check your MyChart online for the results of any tests that had not resulted by the time you left the emergency department.   Follow-up with your primary doctor in 2-3 days regarding your visit.  Your CT scan showed 2 lesions in your liver.  These are probably benign, but our radiologist recommends getting a follow-up MRI scan to further evaluate them.  This can be arranged by your primary care provider.  Return immediately to the emergency department if you experience any of the following: Worsening pain, or any other concerning symptoms.    Thank you for visiting our Emergency Department. It was a pleasure taking care of you today.

## 2023-02-01 NOTE — ED Provider Notes (Signed)
Care assumed from Dr. Eloise Harman, patient with abdominal swelling and shortness of breath. He is pending reading of CT of abdomen and pelvis.  CT scan shows no evidence of obstruction or other acute intra-abdominal pathology.  Incidental finding of 2 vague areas of enhancement in the liver which are indeterminant and will require MRI for further characterization.  I have explained this to the patient.  I reevaluated him, and he states that his breathing is much improved.  I have ordered a dose of bisacodyl for him and advised him to use over-the-counter polyethylene glycol as needed for control of his constipation.  Return precautions were discussed.  Results for orders placed or performed during the hospital encounter of 01/31/23  Lipase, blood  Result Value Ref Range   Lipase 38 11 - 51 U/L  Comprehensive metabolic panel  Result Value Ref Range   Sodium 140 135 - 145 mmol/L   Potassium 4.2 3.5 - 5.1 mmol/L   Chloride 105 98 - 111 mmol/L   CO2 25 22 - 32 mmol/L   Glucose, Bld 115 (H) 70 - 99 mg/dL   BUN 18 8 - 23 mg/dL   Creatinine, Ser 4.13 (H) 0.61 - 1.24 mg/dL   Calcium 9.1 8.9 - 24.4 mg/dL   Total Protein 7.1 6.5 - 8.1 g/dL   Albumin 3.7 3.5 - 5.0 g/dL   AST 15 15 - 41 U/L   ALT 31 0 - 44 U/L   Alkaline Phosphatase 69 38 - 126 U/L   Total Bilirubin 0.4 0.3 - 1.2 mg/dL   GFR, Estimated 57 (L) >60 mL/min   Anion gap 10 5 - 15  CBC  Result Value Ref Range   WBC 11.8 (H) 4.0 - 10.5 K/uL   RBC 4.41 4.22 - 5.81 MIL/uL   Hemoglobin 13.8 13.0 - 17.0 g/dL   HCT 01.0 27.2 - 53.6 %   MCV 91.6 80.0 - 100.0 fL   MCH 31.3 26.0 - 34.0 pg   MCHC 34.2 30.0 - 36.0 g/dL   RDW 64.4 03.4 - 74.2 %   Platelets 146 (L) 150 - 400 K/uL   nRBC 0.0 0.0 - 0.2 %  Urinalysis, Routine w reflex microscopic -Urine, Clean Catch  Result Value Ref Range   Color, Urine YELLOW YELLOW   APPearance CLEAR CLEAR   Specific Gravity, Urine 1.024 1.005 - 1.030   pH 6.0 5.0 - 8.0   Glucose, UA NEGATIVE NEGATIVE  mg/dL   Hgb urine dipstick NEGATIVE NEGATIVE   Bilirubin Urine NEGATIVE NEGATIVE   Ketones, ur NEGATIVE NEGATIVE mg/dL   Protein, ur NEGATIVE NEGATIVE mg/dL   Nitrite NEGATIVE NEGATIVE   Leukocytes,Ua NEGATIVE NEGATIVE  Troponin I (High Sensitivity)  Result Value Ref Range   Troponin I (High Sensitivity) 4 <18 ng/L   CT ABDOMEN PELVIS W CONTRAST  Result Date: 02/01/2023 CLINICAL DATA:  Abdominal pain and bloating. EXAM: CT ABDOMEN AND PELVIS WITH CONTRAST TECHNIQUE: Multidetector CT imaging of the abdomen and pelvis was performed using the standard protocol following bolus administration of intravenous contrast. RADIATION DOSE REDUCTION: This exam was performed according to the departmental dose-optimization program which includes automated exposure control, adjustment of the mA and/or kV according to patient size and/or use of iterative reconstruction technique. CONTRAST:  OMNIPAQUE IOHEXOL 300 MG/ML  SOLN COMPARISON:  CT abdomen and pelvis 06/21/2011. MRI abdomen 08/08/2019. FINDINGS: Lower chest: No acute abnormality. Hepatobiliary: Vague focal area of enhancement seen in the dome of the liver measuring 3.8 x 2.9 cm. Similar  appearing areas seen in the inferior right lobe of the liver measuring 17 by 13 mm image 2/23. These were not seen on the prior MRI in 2021 and are indeterminate. No gallstones, gallbladder wall thickening, or biliary dilatation. Pancreas: Unremarkable. No pancreatic ductal dilatation or surrounding inflammatory changes. Spleen: Normal in size without focal abnormality. Adrenals/Urinary Tract: Bladder is not well evaluated secondary to streak artifact in the pelvis. The kidneys and adrenal glands are within normal limits. Stomach/Bowel: Stomach is within normal limits. Appendix appears normal. No evidence of bowel wall thickening, distention, or inflammatory changes. There are scattered colonic diverticula. There is mild distal esophageal wall thickening.  Vascular/Lymphatic: Aorta and IVC are normal in size. There is atherosclerotic disease throughout the aorta. No enlarged lymph nodes are seen. Reproductive: Prostate gland not well evaluated secondary to streak artifact in the pelvis. Other: There is a small fat containing umbilical hernia. There is a small fat containing left inguinal hernia. There is no free fluid. Musculoskeletal: No acute osseous findings. Bilateral hip arthroplasties are present. IMPRESSION: 1. No acute localizing process in the abdomen or pelvis. 2. Mild distal esophageal wall thickening may represent esophagitis. 3. Colonic diverticulosis. 4. Two vague areas of enhancement in the liver are indeterminate. Recommend further evaluation with MRI. Aortic Atherosclerosis (ICD10-I70.0). Electronically Signed   By: Darliss Cheney M.D.   On: 02/01/2023 01:35   DG Chest 2 View  Result Date: 01/31/2023 CLINICAL DATA:  sob EXAM: CHEST - 2 VIEW COMPARISON:  Chest x-ray 11/21/2019 FINDINGS: The heart and mediastinal contours are within normal limits. Low lung volumes. No focal consolidation. Chronic coarsened interstitial markings with no overt pulmonary edema. No pleural effusion. No pneumothorax. No acute osseous abnormality. IMPRESSION: Low lung volumes with no active cardiopulmonary disease. Electronically Signed   By: Tish Frederickson M.D.   On: 01/31/2023 22:34      Dione Booze, MD 02/01/23 [phone]

## 2023-02-09 DIAGNOSIS — G4733 Obstructive sleep apnea (adult) (pediatric): Secondary | ICD-10-CM | POA: Diagnosis not present

## 2023-02-13 DIAGNOSIS — K648 Other hemorrhoids: Secondary | ICD-10-CM | POA: Diagnosis not present

## 2023-02-13 DIAGNOSIS — Z1211 Encounter for screening for malignant neoplasm of colon: Secondary | ICD-10-CM | POA: Diagnosis not present

## 2023-02-16 DIAGNOSIS — M170 Bilateral primary osteoarthritis of knee: Secondary | ICD-10-CM | POA: Diagnosis not present

## 2023-02-16 DIAGNOSIS — R030 Elevated blood-pressure reading, without diagnosis of hypertension: Secondary | ICD-10-CM | POA: Diagnosis not present

## 2023-02-16 DIAGNOSIS — G8929 Other chronic pain: Secondary | ICD-10-CM | POA: Diagnosis not present

## 2023-02-16 DIAGNOSIS — R7303 Prediabetes: Secondary | ICD-10-CM | POA: Diagnosis not present

## 2023-02-16 DIAGNOSIS — I10 Essential (primary) hypertension: Secondary | ICD-10-CM | POA: Diagnosis not present

## 2023-02-16 DIAGNOSIS — E785 Hyperlipidemia, unspecified: Secondary | ICD-10-CM | POA: Diagnosis not present

## 2023-02-16 DIAGNOSIS — M25562 Pain in left knee: Secondary | ICD-10-CM | POA: Diagnosis not present

## 2023-02-16 DIAGNOSIS — F17210 Nicotine dependence, cigarettes, uncomplicated: Secondary | ICD-10-CM | POA: Diagnosis not present

## 2023-02-22 DIAGNOSIS — M5432 Sciatica, left side: Secondary | ICD-10-CM | POA: Diagnosis not present

## 2023-02-26 ENCOUNTER — Encounter (HOSPITAL_COMMUNITY): Payer: Self-pay

## 2023-02-26 ENCOUNTER — Other Ambulatory Visit: Payer: Self-pay

## 2023-02-26 ENCOUNTER — Emergency Department (HOSPITAL_COMMUNITY)
Admission: EM | Admit: 2023-02-26 | Discharge: 2023-02-26 | Discharge status: Against Medical Advice | Payer: 59 | Attending: Emergency Medicine | Admitting: Emergency Medicine

## 2023-02-26 DIAGNOSIS — R6889 Other general symptoms and signs: Secondary | ICD-10-CM | POA: Diagnosis not present

## 2023-02-26 DIAGNOSIS — R059 Cough, unspecified: Secondary | ICD-10-CM | POA: Diagnosis not present

## 2023-02-26 DIAGNOSIS — Z743 Need for continuous supervision: Secondary | ICD-10-CM | POA: Diagnosis not present

## 2023-02-26 DIAGNOSIS — S29011A Strain of muscle and tendon of front wall of thorax, initial encounter: Secondary | ICD-10-CM | POA: Diagnosis not present

## 2023-02-26 DIAGNOSIS — M549 Dorsalgia, unspecified: Secondary | ICD-10-CM | POA: Diagnosis not present

## 2023-02-26 DIAGNOSIS — M791 Myalgia, unspecified site: Secondary | ICD-10-CM | POA: Diagnosis not present

## 2023-02-26 DIAGNOSIS — Z5321 Procedure and treatment not carried out due to patient leaving prior to being seen by health care provider: Secondary | ICD-10-CM | POA: Diagnosis not present

## 2023-02-26 DIAGNOSIS — X500XXA Overexertion from strenuous movement or load, initial encounter: Secondary | ICD-10-CM | POA: Diagnosis not present

## 2023-02-26 DIAGNOSIS — R0781 Pleurodynia: Secondary | ICD-10-CM | POA: Diagnosis not present

## 2023-02-26 DIAGNOSIS — R109 Unspecified abdominal pain: Secondary | ICD-10-CM | POA: Diagnosis not present

## 2023-02-26 MED ORDER — KETOROLAC TROMETHAMINE 60 MG/2ML IM SOLN
30.0000 mg | Freq: Once | INTRAMUSCULAR | Status: AC
  Administered 2023-02-26: 30 mg via INTRAMUSCULAR
  Filled 2023-02-26: qty 2

## 2023-02-26 MED ORDER — METHOCARBAMOL 500 MG PO TABS
1000.0000 mg | ORAL_TABLET | Freq: Once | ORAL | Status: AC
  Administered 2023-02-26: 1000 mg via ORAL
  Filled 2023-02-26: qty 2

## 2023-02-26 NOTE — ED Triage Notes (Signed)
Rib pain started this morning after [place] Pt complains of muscle pain and is screaming loudly   EMS vitals 153/87 62HR 99% RA 122 BGL  Pt took oxy last night Has chronic hip pain, knees and shoulder

## 2023-02-26 NOTE — ED Notes (Signed)
Rounding Pt standing in ED room  Pt stated his spasms stopped and he is leaving PT did not have to stay for eval Verbally abusive to this RN demanded knife back  Informed security Pt ELOPED

## 2023-02-26 NOTE — ED Notes (Signed)
While triaging the pt Pt refused to give this RN his pocket knife Pt started yelling stating that he was being treated like a criminal Informed pt that all weapons needs to be secured with security and he can get it after discharge Pt continued to refuse Informed security Pt turned knife over to security  Informed MD Pt attached to full monitor

## 2023-03-03 DIAGNOSIS — M545 Low back pain, unspecified: Secondary | ICD-10-CM | POA: Diagnosis not present

## 2023-03-17 DIAGNOSIS — I10 Essential (primary) hypertension: Secondary | ICD-10-CM | POA: Diagnosis not present

## 2023-03-17 DIAGNOSIS — R030 Elevated blood-pressure reading, without diagnosis of hypertension: Secondary | ICD-10-CM | POA: Diagnosis not present

## 2023-03-17 DIAGNOSIS — M545 Low back pain, unspecified: Secondary | ICD-10-CM | POA: Diagnosis not present

## 2023-03-17 DIAGNOSIS — M25562 Pain in left knee: Secondary | ICD-10-CM | POA: Diagnosis not present

## 2023-03-17 DIAGNOSIS — E785 Hyperlipidemia, unspecified: Secondary | ICD-10-CM | POA: Diagnosis not present

## 2023-03-17 DIAGNOSIS — G8929 Other chronic pain: Secondary | ICD-10-CM | POA: Diagnosis not present

## 2023-03-17 DIAGNOSIS — M170 Bilateral primary osteoarthritis of knee: Secondary | ICD-10-CM | POA: Diagnosis not present

## 2023-03-17 DIAGNOSIS — R7303 Prediabetes: Secondary | ICD-10-CM | POA: Diagnosis not present

## 2023-03-17 DIAGNOSIS — F17210 Nicotine dependence, cigarettes, uncomplicated: Secondary | ICD-10-CM | POA: Diagnosis not present

## 2023-04-13 DIAGNOSIS — M5416 Radiculopathy, lumbar region: Secondary | ICD-10-CM | POA: Diagnosis not present

## 2023-04-17 ENCOUNTER — Encounter: Payer: Self-pay | Admitting: Podiatry

## 2023-04-17 ENCOUNTER — Ambulatory Visit (INDEPENDENT_AMBULATORY_CARE_PROVIDER_SITE_OTHER): Payer: 59 | Admitting: Podiatry

## 2023-04-17 DIAGNOSIS — I10 Essential (primary) hypertension: Secondary | ICD-10-CM | POA: Diagnosis not present

## 2023-04-17 DIAGNOSIS — R7303 Prediabetes: Secondary | ICD-10-CM | POA: Diagnosis not present

## 2023-04-17 DIAGNOSIS — L603 Nail dystrophy: Secondary | ICD-10-CM | POA: Diagnosis not present

## 2023-04-17 DIAGNOSIS — M79675 Pain in left toe(s): Secondary | ICD-10-CM | POA: Diagnosis not present

## 2023-04-17 DIAGNOSIS — M25562 Pain in left knee: Secondary | ICD-10-CM | POA: Diagnosis not present

## 2023-04-17 DIAGNOSIS — M170 Bilateral primary osteoarthritis of knee: Secondary | ICD-10-CM | POA: Diagnosis not present

## 2023-04-17 DIAGNOSIS — F17210 Nicotine dependence, cigarettes, uncomplicated: Secondary | ICD-10-CM | POA: Diagnosis not present

## 2023-04-17 DIAGNOSIS — G8929 Other chronic pain: Secondary | ICD-10-CM | POA: Diagnosis not present

## 2023-04-17 DIAGNOSIS — L03039 Cellulitis of unspecified toe: Secondary | ICD-10-CM | POA: Diagnosis not present

## 2023-04-17 DIAGNOSIS — E785 Hyperlipidemia, unspecified: Secondary | ICD-10-CM | POA: Diagnosis not present

## 2023-04-17 DIAGNOSIS — L600 Ingrowing nail: Secondary | ICD-10-CM

## 2023-04-17 DIAGNOSIS — M545 Low back pain, unspecified: Secondary | ICD-10-CM | POA: Diagnosis not present

## 2023-04-17 NOTE — Patient Instructions (Signed)

## 2023-04-17 NOTE — Progress Notes (Signed)
  Subjective:  Patient ID: Brandon Ferrell, male    DOB: September 19, 1955,   MRN: [id number]  Chief Complaint  Patient presents with   Nail Problem    Pt presents for  a painful toenail on the left foot states that others does hurt too but not as bad as the great toenails.    68 y.o. male presents for concern of painful left great toenail that has been ongoing for about a month. Relates very tender to touch. Has tried trimming with no relief . Denies any other pedal complaints. Denies n/v/f/c.   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 CFT <3 seconds. Normal hair growth on digits. No edema.  Skin. No lacerations or abrasions bilateral feet. Left great toe dystrophic and tender to palpation. Incurvation noted to bilateral edges. No erythema edema or purulence noted.  Musculoskeletal: MMT 5/5 bilateral lower extremities in DF, PF, Inversion and Eversion. Deceased ROM in DF of ankle joint.  Neurological: Sensation intact to light touch.   Assessment:   1. Ingrown left greater toenail   2. Onychodystrophy      Plan:  Patient was evaluated and treated and all questions answered. Discussed ingrown toenails etiology and treatment options including procedure for removal vs conservative care.  Patient requesting removal of ingrown nail today. Procedure below.  Discussed procedure and post procedure care and patient expressed understanding.  Will follow-up in 2 weeks for nail check or sooner if any problems arise.    Procedure:   Procedure: total Nail Avulsion of left hallux nail Surgeon: Asberry Failing, DPM  Pre-op Dx: Ingrown toenail without infection Post-op: Same  Place of Surgery: Office exam room.  Indications for surgery: Painful and ingrown toenail.    The patient is requesting removal of nail without  chemical matrixectomy. Risks and complications were discussed with the patient for which they understand and written consent was obtained. Under sterile conditions a total of 3 mL of  1% lidocaine  plain was infiltrated in a hallux block fashion. Once anesthetized, the skin was prepped in sterile fashion. A tourniquet was then applied. Next the entire hallux nail was removed and area copiously irrigated. Silvadene was applied. A dry sterile dressing was applied. After application of the dressing the tourniquet was removed and there is found to be an immediate capillary refill time to the digit. The patient tolerated the procedure well without any complications. Post procedure instructions were discussed the patient for which he verbally understood. Follow-up in two weeks for nail check or sooner if any problems are to arise. Discussed signs/symptoms of infection and directed to call the office immediately should any occur or go directly to the emergency room. In the meantime, encouraged to call the office with any questions, concerns, changes symptoms.   Asberry Failing, DPM

## 2023-04-27 DIAGNOSIS — M5416 Radiculopathy, lumbar region: Secondary | ICD-10-CM | POA: Diagnosis not present

## 2023-05-01 ENCOUNTER — Encounter: Payer: Self-pay | Admitting: Podiatry

## 2023-05-01 ENCOUNTER — Ambulatory Visit (INDEPENDENT_AMBULATORY_CARE_PROVIDER_SITE_OTHER): Payer: 59 | Admitting: Podiatry

## 2023-05-01 DIAGNOSIS — L603 Nail dystrophy: Secondary | ICD-10-CM

## 2023-05-01 DIAGNOSIS — L600 Ingrowing nail: Secondary | ICD-10-CM

## 2023-05-01 NOTE — Progress Notes (Signed)
  Subjective:  Patient ID: Brandon Ferrell, male    DOB: 1955-12-11,   MRN: [id number]  Chief Complaint  Patient presents with   Nail Problem    Pt presents for  nail check, states he is doing ok.     68 y.o. male presents for follow-up fo left hallux nail avulsion. Relates doing well and soaking as instructed . Denies any other pedal complaints. Denies n/v/f/c.   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 CFT <3 seconds. Normal hair growth on digits. No edema.  Skin. No lacerations or abrasions bilateral feet. Left hallux nail bed healing well.  Musculoskeletal: MMT 5/5 bilateral lower extremities in DF, PF, Inversion and Eversion. Deceased ROM in DF of ankle joint.  Neurological: Sensation intact to light touch.   Assessment:   1. Ingrown left greater toenail   2. Onychodystrophy      Plan:  Patient was evaluated and treated and all questions answered. Toe was evaluated and appears to be healing well.  May discontinue soaks and neosporin.  Patient to follow-up as needed.    Louann Sjogren, DPM

## 2023-05-16 DIAGNOSIS — M5416 Radiculopathy, lumbar region: Secondary | ICD-10-CM | POA: Diagnosis not present

## 2023-05-18 DIAGNOSIS — G8929 Other chronic pain: Secondary | ICD-10-CM | POA: Diagnosis not present

## 2023-05-18 DIAGNOSIS — R030 Elevated blood-pressure reading, without diagnosis of hypertension: Secondary | ICD-10-CM | POA: Diagnosis not present

## 2023-05-18 DIAGNOSIS — M170 Bilateral primary osteoarthritis of knee: Secondary | ICD-10-CM | POA: Diagnosis not present

## 2023-05-18 DIAGNOSIS — M79675 Pain in left toe(s): Secondary | ICD-10-CM | POA: Diagnosis not present

## 2023-05-18 DIAGNOSIS — E782 Mixed hyperlipidemia: Secondary | ICD-10-CM | POA: Diagnosis not present

## 2023-05-18 DIAGNOSIS — M545 Low back pain, unspecified: Secondary | ICD-10-CM | POA: Diagnosis not present

## 2023-05-18 DIAGNOSIS — M25562 Pain in left knee: Secondary | ICD-10-CM | POA: Diagnosis not present

## 2023-05-18 DIAGNOSIS — E785 Hyperlipidemia, unspecified: Secondary | ICD-10-CM | POA: Diagnosis not present

## 2023-05-18 DIAGNOSIS — R7303 Prediabetes: Secondary | ICD-10-CM | POA: Diagnosis not present

## 2023-05-18 DIAGNOSIS — F17210 Nicotine dependence, cigarettes, uncomplicated: Secondary | ICD-10-CM | POA: Diagnosis not present

## 2023-05-18 DIAGNOSIS — I10 Essential (primary) hypertension: Secondary | ICD-10-CM | POA: Diagnosis not present

## 2023-05-22 DIAGNOSIS — M5416 Radiculopathy, lumbar region: Secondary | ICD-10-CM | POA: Diagnosis not present

## 2023-06-16 DIAGNOSIS — M25562 Pain in left knee: Secondary | ICD-10-CM | POA: Diagnosis not present

## 2023-06-16 DIAGNOSIS — F17210 Nicotine dependence, cigarettes, uncomplicated: Secondary | ICD-10-CM | POA: Diagnosis not present

## 2023-06-16 DIAGNOSIS — M545 Low back pain, unspecified: Secondary | ICD-10-CM | POA: Diagnosis not present

## 2023-06-16 DIAGNOSIS — M170 Bilateral primary osteoarthritis of knee: Secondary | ICD-10-CM | POA: Diagnosis not present

## 2023-06-16 DIAGNOSIS — E785 Hyperlipidemia, unspecified: Secondary | ICD-10-CM | POA: Diagnosis not present

## 2023-06-16 DIAGNOSIS — R030 Elevated blood-pressure reading, without diagnosis of hypertension: Secondary | ICD-10-CM | POA: Diagnosis not present

## 2023-06-16 DIAGNOSIS — R8290 Unspecified abnormal findings in urine: Secondary | ICD-10-CM | POA: Diagnosis not present

## 2023-06-16 DIAGNOSIS — G8929 Other chronic pain: Secondary | ICD-10-CM | POA: Diagnosis not present

## 2023-06-16 DIAGNOSIS — R7303 Prediabetes: Secondary | ICD-10-CM | POA: Diagnosis not present

## 2023-07-04 DIAGNOSIS — M48061 Spinal stenosis, lumbar region without neurogenic claudication: Secondary | ICD-10-CM | POA: Diagnosis not present

## 2023-07-04 DIAGNOSIS — M5416 Radiculopathy, lumbar region: Secondary | ICD-10-CM | POA: Diagnosis not present

## 2023-07-10 ENCOUNTER — Ambulatory Visit
Admission: EM | Admit: 2023-07-10 | Discharge: 2023-07-10 | Disposition: A | Discharge status: Home / Self Care | Attending: Internal Medicine | Admitting: Internal Medicine

## 2023-07-10 DIAGNOSIS — L600 Ingrowing nail: Secondary | ICD-10-CM | POA: Diagnosis not present

## 2023-07-10 DIAGNOSIS — L03031 Cellulitis of right toe: Secondary | ICD-10-CM

## 2023-07-10 MED ORDER — CEPHALEXIN 500 MG PO CAPS: 500.0000 mg | ORAL_CAPSULE | Freq: Three times a day (TID) | ORAL | 0 refills | Status: AC

## 2023-07-10 NOTE — Discharge Instructions (Addendum)
 Take antibiotic as prescribed. Soak toe in epsom salt every 4 hours to reduce inflammation and swelling. Wear the shoe provided today to the right foot  to protect and support the right toe. Follow-up with podiatrist as scheduled in 2 days.  If you develop any new or worsening symptoms or if your symptoms do not start to improve, please return here or follow-up with your primary care provider. If your symptoms are severe, please go to the emergency room.

## 2023-07-10 NOTE — ED Triage Notes (Signed)
 Right big toe pain x 2 weeks

## 2023-07-10 NOTE — ED Provider Notes (Signed)
 [facility]    CSN: [id number] Arrival date & time: 07/10/23  1313      History   Chief Complaint Chief Complaint  Patient presents with   Toe Pain    HPI Brandon Ferrell is a 68 y.o. male.   Brandon Ferrell is a 68 y.o. male presenting for chief complaint of Toe Pain that started 2 weeks ago without trauma/injury. Both sides of the right great toe are ingrown. He has noticed some redness to both sides of the toe. Denies drainage from the nailbed. No numbness/tingling distally to pain. States pain is significant and worsened by wearing socks/tight shoes. He was able to make an appointment with his podiatrist ([facility]) for Wednesday July 12, 2023. Taking tylenol with minimal relief.      Past Medical History:  Diagnosis Date   Acute renal insufficiency 07/18/2015   Acute urinary retention 07/18/2015   Arthritis    Asthma    as a child   COPD (chronic obstructive pulmonary disease) (HCC)    GERD (gastroesophageal reflux disease)    occ zantac   Hx of adenomatous colonic polyps 08/30/2017   Hyperlipidemia    Hypertension    Palpitations    Pneumonia 2017   hx   Primary localized osteoarthrosis of left shoulder 12/04/2018    Patient Active Problem List   Diagnosis Date Noted   Osteoarthritis of left knee 07/14/2021   Primary localized osteoarthrosis of left shoulder 12/04/2018   Localized primary osteoarthritis of left shoulder region 12/04/2018   Hx of adenomatous colonic polyps 08/30/2017   Degenerative joint disease of left hip 07/17/2015   Primary localized osteoarthritis of right hip 11/07/2014   Hyperlipidemia 06/21/2006   TOBACCO ABUSE 06/21/2006   PRESBYOPIA 06/21/2006   Essential hypertension 06/21/2006   INSOMNIA 06/21/2006    Past Surgical History:  Procedure Laterality Date   BICEPT TENODESIS Left 08/13/2020   Procedure: OPEN BICEPS TENODESIS;  Surgeon: Teryl Lucy, MD;  Location: [facility];  Service:  Orthopedics;  Laterality: Left;   CERVICAL FUSION     GROIN EXPLORATION Right 07/03/15   cyst removed- local anesthesia [place]   LUNG BIOPSY  13   pneum, dx copd   PARTIAL KNEE ARTHROPLASTY Left 07/27/2021   Procedure: UNICOMPARTMENTAL KNEE;  Surgeon: Teryl Lucy, MD;  Location: [facility];  Service: Orthopedics;  Laterality: Left;   TOTAL HIP ARTHROPLASTY Right 11/07/2014   Procedure: RIGHT  TOTAL HIP ARTHROPLASTY;  Surgeon: Frederico Hamman, MD;  Location: [facility];  Service: Orthopedics;  Laterality: Right;  Would like to flip if possible     TOTAL HIP ARTHROPLASTY Left 07/17/2015   Procedure: LEFT TOTAL HIP ARTHROPLASTY;  Surgeon: Frederico Hamman, MD;  Location: [facility];  Service: Orthopedics;  Laterality: Left;   TOTAL SHOULDER ARTHROPLASTY Left 12/04/2018   Procedure: TOTAL SHOULDER ARTHROPLASTY;  Surgeon: Teryl Lucy, MD;  Location: [facility];  Service: Orthopedics;  Laterality: Left;   VASECTOMY  5/15       Home Medications    Prior to Admission medications   Medication Sig Start Date End Date Taking? Authorizing Provider  albuterol (PROVENTIL HFA;VENTOLIN HFA) 108 (90 BASE) MCG/ACT inhaler Inhale 2 puffs into the lungs every 6 (six) hours as needed for wheezing or shortness of breath.   Yes [provider]  amLODipine (NORVASC) 10 MG tablet Take 10 mg by mouth daily.   Yes [provider]  baclofen (LIORESAL) 10 MG tablet Take 1 tablet (  10 mg total) by mouth 3 (three) times daily. As needed for muscle spasm 07/27/21  Yes Janine Ores K, PA-C  cephALEXin (KEFLEX) 500 MG capsule Take 1 capsule (500 mg total) by mouth 3 (three) times daily for 5 days. 07/10/23 07/15/23 Yes Garrie Woodin, Donavan Burnet, FNP  diphenhydrAMINE (BENADRYL) 25 MG tablet Take 25 mg by mouth every 6 (six) hours as needed for allergies or sleep.   Yes [provider]  Fluticasone-Umeclidin-Vilant (TRELEGY ELLIPTA) 200-62.5-25 MCG/INH AEPB Inhale 1 Inhaler into the lungs in the morning.   Yes [provider]  gabapentin (NEURONTIN) 300 MG capsule Take 300 mg by mouth at bedtime.  08/15/17  Yes [provider]  omeprazole (PRILOSEC) 40 MG capsule Take 40 mg by mouth daily.   Yes [provider]  Oxycodone HCl 10 MG TABS Take 1 tablet by mouth as needed. RX'd by Dr. Roxy Horseman 03/06/19  Yes [provider]  rosuvastatin (CRESTOR) 20 MG tablet Take 20 mg by mouth daily.  07/24/17  Yes [provider]  sennosides-docusate sodium (SENOKOT-S) 8.6-50 MG tablet Take 2 tablets by mouth daily. 07/27/21  Yes Armida Sans, PA-C  aspirin EC 325 MG tablet Take 1 tablet (325 mg total) by mouth 2 (two) times daily. Patient not taking: Reported on 07/10/2023 07/27/21   Armida Sans, PA-C  HYDROmorphone (DILAUDID) 2 MG tablet Take 1 tablet (2 mg total) by mouth every 4 (four) hours as needed for severe pain. Patient not taking: Reported on 07/10/2023 07/27/21   Armida Sans, PA-C  naloxone [facility]) nasal spray 4 mg/0.1 mL SMARTSIG:Both Nares 02/23/21   [provider]  olopatadine (PATADAY) 0.1 % ophthalmic solution Place 1 drop into both eyes 2 (two) times daily. 01/18/23   Leath-Warren, Sadie Haber, NP  ondansetron (ZOFRAN) 4 MG tablet Take 1 tablet (4 mg total) by mouth every 8 (eight) hours as needed for nausea or vomiting. 07/27/21   Janine Ores K, PA-C  polyethylene glycol (MIRALAX) 17 g packet Take 17 g by mouth daily. 02/01/23   Rondel Baton, MD    Family History Family History  Problem Relation Age of Onset   Colon cancer Maternal Grandfather    Stomach cancer Father    Colon polyps Neg Hx    Esophageal cancer Neg Hx    Rectal cancer Neg Hx     Social History Social History   Tobacco Use   Smoking status: Some Days    Current packs/day: 1.00    Average packs/day: 1 pack/day for 40.0 years (40.0 ttl pk-yrs)    Types: Cigars, Cigarettes   Smokeless tobacco: Never   Tobacco comments:    5 cigars daily  Vaping Use   Vaping status:  Never Used  Substance Use Topics   Alcohol use: Yes    Comment: occas.   Drug use: No     Allergies   Bee venom and Shellfish allergy   Review of Systems Review of Systems Per HPI  Physical Exam Triage Vital Signs ED Triage Vitals [07/10/23 1444]  Encounter Vitals Group     BP (!) 144/94     Systolic BP Percentile      Diastolic BP Percentile      Pulse Rate 71     Resp 18     Temp 98.2 F (36.8 C)     Temp Source Oral     SpO2 100 %     Weight      Height  Head Circumference      Peak Flow      Pain Score 10     Pain Loc      Pain Education      Exclude from Growth Chart    No data found.  Updated Vital Signs BP (!) 144/94 (BP Location: Right Arm)   Pulse 71   Temp 98.2 F (36.8 C) (Oral)   Resp 18   SpO2 100%   Visual Acuity Right Eye Distance:   Left Eye Distance:   Bilateral Distance:    Right Eye Near:   Left Eye Near:    Bilateral Near:     Physical Exam Vitals and nursing note reviewed.  Constitutional:      Appearance: He is not ill-appearing or toxic-appearing.  HENT:     Head: Normocephalic and atraumatic.     Right Ear: Hearing and external ear normal.     Left Ear: Hearing and external ear normal.     Nose: Nose normal.     Mouth/Throat:     Lips: Pink.  Eyes:     General: Lids are normal. Vision grossly intact. Gaze aligned appropriately.     Extraocular Movements: Extraocular movements intact.     Conjunctiva/sclera: Conjunctivae normal.  Cardiovascular:     Pulses:          Dorsalis pedis pulses are 2+ on the right side.       Posterior tibial pulses are 2+ on the right side.  Pulmonary:     Effort: Pulmonary effort is normal.  Musculoskeletal:     Cervical back: Neck supple.  Feet:     Right foot:     Skin integrity: Erythema present.     Toenail Condition: Right toenails are abnormally thick and ingrown.     Comments: Ingrown right great toenail to bilateral nail folds. No purulent drainage. Erythema present to  bilateral nail folds. Sensation intact distally to pain/injury.  Skin:    General: Skin is warm and dry.     Capillary Refill: Capillary refill takes less than 2 seconds.     Findings: No rash.  Neurological:     General: No focal deficit present.     Mental Status: He is alert and oriented to person, place, and time. Mental status is at baseline.     Cranial Nerves: No dysarthria or facial asymmetry.  Psychiatric:        Mood and Affect: Mood normal.        Speech: Speech normal.        Behavior: Behavior normal.        Thought Content: Thought content normal.        Judgment: Judgment normal.    Right great toe   UC Treatments / Results  Labs (all labs ordered are listed, but only abnormal results are displayed) Labs Reviewed - No data to display  EKG   Radiology No results found.  Procedures Procedures (including critical care time)  Medications Ordered in UC Medications - No data to display  Initial Impression / Assessment and Plan / UC Course  I have reviewed the triage vital signs and the nursing notes.  Pertinent labs & imaging results that were available during my care of the patient were reviewed by me and considered in my medical decision making (see chart for details).   1. Ingrown toenail of right foot Both sides of right great toe are ingrown. Mild erythema, will treat for cellulitis of right great toe secondary  to ingrown nail with keflex TID for 5 days.  Epsom salt soaks recommended. Follow-up with [facility] as scheduled.   Counseled patient on potential for adverse effects with medications prescribed/recommended today, strict ER and return-to-clinic precautions discussed, patient verbalized understanding.    Final Clinical Impressions(s) / UC Diagnoses   Final diagnoses:  Ingrown toenail of right foot  Cellulitis of great toe of right foot     Discharge Instructions      Take antibiotic as prescribed. Soak toe in epsom salt  every 4 hours to reduce inflammation and swelling. Wear the shoe provided today to the right foot  to protect and support the right toe. Follow-up with podiatrist as scheduled in 2 days.  If you develop any new or worsening symptoms or if your symptoms do not start to improve, please return here or follow-up with your primary care provider. If your symptoms are severe, please go to the emergency room.    ED Prescriptions     Medication Sig Dispense Auth. Provider   cephALEXin (KEFLEX) 500 MG capsule Take 1 capsule (500 mg total) by mouth 3 (three) times daily for 5 days. 15 capsule Carlisle Beers, FNP      PDMP not reviewed this encounter.   Carlisle Beers, [place] 07/10/23 1527

## 2023-07-12 ENCOUNTER — Encounter: Payer: Self-pay | Admitting: Podiatry

## 2023-07-12 ENCOUNTER — Ambulatory Visit (INDEPENDENT_AMBULATORY_CARE_PROVIDER_SITE_OTHER): Admitting: Podiatry

## 2023-07-12 DIAGNOSIS — L600 Ingrowing nail: Secondary | ICD-10-CM

## 2023-07-12 NOTE — Patient Instructions (Signed)

## 2023-07-12 NOTE — Progress Notes (Signed)
  Subjective:  Patient ID: Brandon Ferrell, male    DOB: September 28, 1955,   MRN: [id number]  No chief complaint on file.   68 y.o. male presents for new concern of ingrown on right great toenail. Relates been present for several weeks now and getting worse. Has had left nail removed recently and doing well although does have some soreness with this as well. .  . Denies any other pedal complaints. Denies n/v/f/c.   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 CFT <3 seconds. Normal hair growth on digits. No edema.  Skin. No lacerations or abrasions bilateral feet. Incurvation of medial and lateral border of right great toenail with tenderness to palpation about the whole right hallux.  Musculoskeletal: MMT 5/5 bilateral lower extremities in DF, PF, Inversion and Eversion. Deceased ROM in DF of ankle joint.  Neurological: Sensation intact to light touch.   Assessment:   1. Ingrown right greater toenail      Plan:  Patient was evaluated and treated and all questions answered. Discussed ingrown toenails etiology and treatment options including procedure for removal vs conservative care.  Patient requesting removal of ingrown nail today. Procedure below.  Discussed procedure and post procedure care and patient expressed understanding.  Will follow-up in 2 weeks for nail check or sooner if any problems arise.    Procedure:  Procedure: total Nail Avulsion of right hallux nail Surgeon: Louann Sjogren, DPM  Pre-op Dx: Ingrown  toenail without infection Post-op: Same  Place of Surgery: Office exam room.  Indications for surgery: Painful and ingrown toenail.    The patient is requesting removal of nail with  chemical matrixectomy. Risks and complications were discussed with the patient for which they understand and written consent was obtained. Under sterile conditions a total of 3 mL of  1% lidocaine plain was infiltrated in a hallux block fashion. Once anesthetized, the skin was prepped in sterile fashion. A tourniquet was then applied. Next the entire hallux nail was removed.  Next phenol was then applied under standard conditions to permanently destroy the matrix and copiously irrigated. Silvadene was applied. A dry sterile dressing was applied. After application of the dressing the tourniquet was removed and there is found to be an immediate capillary refill time to the digit. The patient tolerated the procedure well without any complications. Post procedure instructions were discussed the patient for which he verbally understood. Follow-up in two weeks for nail check or sooner if any problems are to arise. Discussed signs/symptoms of infection and directed to call the office immediately should any occur or go directly to the emergency room. In the meantime, encouraged to call the office with any questions, concerns, changes symptoms.   Louann Sjogren, DPM

## 2023-07-17 DIAGNOSIS — I10 Essential (primary) hypertension: Secondary | ICD-10-CM | POA: Diagnosis not present

## 2023-07-17 DIAGNOSIS — R7303 Prediabetes: Secondary | ICD-10-CM | POA: Diagnosis not present

## 2023-07-17 DIAGNOSIS — F17210 Nicotine dependence, cigarettes, uncomplicated: Secondary | ICD-10-CM | POA: Diagnosis not present

## 2023-07-17 DIAGNOSIS — E785 Hyperlipidemia, unspecified: Secondary | ICD-10-CM | POA: Diagnosis not present

## 2023-07-17 DIAGNOSIS — G8929 Other chronic pain: Secondary | ICD-10-CM | POA: Diagnosis not present

## 2023-07-17 DIAGNOSIS — M170 Bilateral primary osteoarthritis of knee: Secondary | ICD-10-CM | POA: Diagnosis not present

## 2023-07-26 ENCOUNTER — Ambulatory Visit (INDEPENDENT_AMBULATORY_CARE_PROVIDER_SITE_OTHER): Admitting: Podiatry

## 2023-07-26 ENCOUNTER — Encounter: Payer: Self-pay | Admitting: Podiatry

## 2023-07-26 DIAGNOSIS — L600 Ingrowing nail: Secondary | ICD-10-CM

## 2023-07-26 NOTE — Progress Notes (Signed)
  Subjective:  Patient ID: Brandon Ferrell, male    DOB: 06-Apr-1955,   MRN: [id number]  No chief complaint on file.   68 y.o. male presents for follow-up fo right hallux nail avulsion. Relates doing well and soaking as instructed . Denies any other pedal complaints. Denies n/v/f/c.   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 CFT <3 seconds. Normal hair growth on digits. No edema.  Skin. No lacerations or abrasions bilateral feet. Right hallux nail bed healing well.  Musculoskeletal: MMT 5/5 bilateral lower extremities in DF, PF, Inversion and Eversion. Deceased ROM in DF of ankle joint.  Neurological: Sensation intact to light touch.   Assessment:   1. Ingrown right greater toenail       Plan:  Patient was evaluated and treated and all questions answered. Toe was evaluated and appears to be healing well.  May discontinue soaks and neosporin.  Patient to follow-up as needed.    Jennefer Moats, DPM

## 2023-08-16 DIAGNOSIS — R7303 Prediabetes: Secondary | ICD-10-CM | POA: Diagnosis not present

## 2023-08-16 DIAGNOSIS — E785 Hyperlipidemia, unspecified: Secondary | ICD-10-CM | POA: Diagnosis not present

## 2023-08-16 DIAGNOSIS — R892 Abnormal level of other drugs, medicaments and biological substances in specimens from other organs, systems and tissues: Secondary | ICD-10-CM | POA: Diagnosis not present

## 2023-08-16 DIAGNOSIS — G4733 Obstructive sleep apnea (adult) (pediatric): Secondary | ICD-10-CM | POA: Diagnosis not present

## 2023-08-16 DIAGNOSIS — M792 Neuralgia and neuritis, unspecified: Secondary | ICD-10-CM | POA: Diagnosis not present

## 2023-08-16 DIAGNOSIS — Z0000 Encounter for general adult medical examination without abnormal findings: Secondary | ICD-10-CM | POA: Diagnosis not present

## 2023-08-16 DIAGNOSIS — F17210 Nicotine dependence, cigarettes, uncomplicated: Secondary | ICD-10-CM | POA: Diagnosis not present

## 2023-08-21 ENCOUNTER — Ambulatory Visit: Admitting: Podiatrist

## 2023-08-22 DIAGNOSIS — F17210 Nicotine dependence, cigarettes, uncomplicated: Secondary | ICD-10-CM | POA: Diagnosis not present

## 2023-08-22 DIAGNOSIS — G4733 Obstructive sleep apnea (adult) (pediatric): Secondary | ICD-10-CM | POA: Diagnosis not present

## 2023-08-22 DIAGNOSIS — R892 Abnormal level of other drugs, medicaments and biological substances in specimens from other organs, systems and tissues: Secondary | ICD-10-CM | POA: Diagnosis not present

## 2023-08-22 DIAGNOSIS — E785 Hyperlipidemia, unspecified: Secondary | ICD-10-CM | POA: Diagnosis not present

## 2023-08-22 DIAGNOSIS — R7303 Prediabetes: Secondary | ICD-10-CM | POA: Diagnosis not present

## 2023-08-22 DIAGNOSIS — M25562 Pain in left knee: Secondary | ICD-10-CM | POA: Diagnosis not present

## 2023-08-22 DIAGNOSIS — M170 Bilateral primary osteoarthritis of knee: Secondary | ICD-10-CM | POA: Diagnosis not present

## 2023-08-22 DIAGNOSIS — G8929 Other chronic pain: Secondary | ICD-10-CM | POA: Diagnosis not present

## 2023-08-22 DIAGNOSIS — I10 Essential (primary) hypertension: Secondary | ICD-10-CM | POA: Diagnosis not present

## 2023-08-22 DIAGNOSIS — K649 Unspecified hemorrhoids: Secondary | ICD-10-CM | POA: Diagnosis not present

## 2023-08-22 DIAGNOSIS — M792 Neuralgia and neuritis, unspecified: Secondary | ICD-10-CM | POA: Diagnosis not present

## 2023-08-23 ENCOUNTER — Ambulatory Visit (INDEPENDENT_AMBULATORY_CARE_PROVIDER_SITE_OTHER): Admitting: Podiatrist

## 2023-08-23 ENCOUNTER — Encounter: Payer: Self-pay | Admitting: Podiatrist

## 2023-08-23 DIAGNOSIS — Z9889 Other specified postprocedural states: Secondary | ICD-10-CM | POA: Diagnosis not present

## 2023-08-23 MED ORDER — MUPIROCIN 2 % EX OINT: 1.0000 | TOPICAL_OINTMENT | Freq: Two times a day (BID) | CUTANEOUS | 2 refills | Status: AC

## 2023-08-23 MED ORDER — DOXYCYCLINE HYCLATE 100 MG PO TABS: 100.0000 mg | ORAL_TABLET | Freq: Two times a day (BID) | ORAL | 0 refills | Status: AC

## 2023-08-23 NOTE — Progress Notes (Signed)
 Chief Complaint  Patient presents with   Nail Problem    "I had this toenail taken off.  It's still hurting.  It's still oozing and my toe has turned black."     HPI: Patient is 68 y.o. male who presents today for concern of his right hallux nail-  had an ingrown removed 07/12/23 and its still painful and oozing a clear fluid. No redness, slight dark discoloration proximal to nail fold    Allergies  Allergen Reactions   Bee Venom Anaphylaxis   Shellfish Allergy Anaphylaxis and Hives    Lobester, Spider Bites    Review of systems is negative except as noted in the HPI.  Denies nausea/ vomiting/ fevers/ chills or night sweats.   Denies difficulty breathing, denies calf pain or tenderness  Physical Exam  Patient is awake, alert, and oriented x 3.  In no acute distress.    Vascular status is intact with palpable pedal pulses DP and PT bilateral and capillary refill time less than 3 seconds bilateral.  No edema or erythema noted.   Neurological exam reveals epicritic and protective sensation grossly intact bilateral.   Dermatological exam reveals clear drainage.  Some discoloration proximal to the nail fold noted.  Medial and lateral corners have dried blood present.    Musculoskeletal exam: Musculature intact with dorsiflexion, plantarflexion, inversion, eversion. Ankle and First MPJ joint range of motion normal.    Assessment: Slow healing status post nail avulsion/    Plan: Exam findings and treatment recommendations are discussed.  There is some discoloration around the nail fold as well as clear drainage-  will put him on doxycycline  in care there is infection present.  Likely slow healing process due to chemical burn.  Also rx for mupirocin was written.  He will continue soaks.   He will follow up with Dr. Sikora in 2 weeks if the nail is still giving him a problem.

## 2023-08-23 NOTE — Patient Instructions (Signed)
 Continue soaking in epsom salt soaks every other day for the next 2 weeks.  Apply the antibiotic Rx 2 to 3 times a day and cover with a bandaid.  May leave the toe open to air at times or at night as well

## 2023-08-30 DIAGNOSIS — I10 Essential (primary) hypertension: Secondary | ICD-10-CM | POA: Diagnosis not present

## 2023-08-30 DIAGNOSIS — R7303 Prediabetes: Secondary | ICD-10-CM | POA: Diagnosis not present

## 2023-08-30 DIAGNOSIS — F17210 Nicotine dependence, cigarettes, uncomplicated: Secondary | ICD-10-CM | POA: Diagnosis not present

## 2023-08-30 DIAGNOSIS — M170 Bilateral primary osteoarthritis of knee: Secondary | ICD-10-CM | POA: Diagnosis not present

## 2023-08-30 DIAGNOSIS — E785 Hyperlipidemia, unspecified: Secondary | ICD-10-CM | POA: Diagnosis not present

## 2023-08-30 DIAGNOSIS — Z87891 Personal history of nicotine dependence: Secondary | ICD-10-CM | POA: Diagnosis not present

## 2023-08-30 DIAGNOSIS — G4733 Obstructive sleep apnea (adult) (pediatric): Secondary | ICD-10-CM | POA: Diagnosis not present

## 2023-08-30 DIAGNOSIS — M25562 Pain in left knee: Secondary | ICD-10-CM | POA: Diagnosis not present

## 2023-08-30 DIAGNOSIS — R030 Elevated blood-pressure reading, without diagnosis of hypertension: Secondary | ICD-10-CM | POA: Diagnosis not present

## 2023-08-30 DIAGNOSIS — K649 Unspecified hemorrhoids: Secondary | ICD-10-CM | POA: Diagnosis not present

## 2023-08-30 DIAGNOSIS — G8929 Other chronic pain: Secondary | ICD-10-CM | POA: Diagnosis not present

## 2023-09-19 DIAGNOSIS — H905 Unspecified sensorineural hearing loss: Secondary | ICD-10-CM | POA: Diagnosis not present

## 2023-09-30 DIAGNOSIS — G4733 Obstructive sleep apnea (adult) (pediatric): Secondary | ICD-10-CM | POA: Diagnosis not present

## 2023-09-30 DIAGNOSIS — M170 Bilateral primary osteoarthritis of knee: Secondary | ICD-10-CM | POA: Diagnosis not present

## 2023-09-30 DIAGNOSIS — J449 Chronic obstructive pulmonary disease, unspecified: Secondary | ICD-10-CM | POA: Diagnosis not present

## 2023-09-30 DIAGNOSIS — F17210 Nicotine dependence, cigarettes, uncomplicated: Secondary | ICD-10-CM | POA: Diagnosis not present

## 2023-09-30 DIAGNOSIS — E785 Hyperlipidemia, unspecified: Secondary | ICD-10-CM | POA: Diagnosis not present

## 2023-09-30 DIAGNOSIS — M792 Neuralgia and neuritis, unspecified: Secondary | ICD-10-CM | POA: Diagnosis not present

## 2023-09-30 DIAGNOSIS — I10 Essential (primary) hypertension: Secondary | ICD-10-CM | POA: Diagnosis not present

## 2023-09-30 DIAGNOSIS — M25562 Pain in left knee: Secondary | ICD-10-CM | POA: Diagnosis not present

## 2023-09-30 DIAGNOSIS — R7303 Prediabetes: Secondary | ICD-10-CM | POA: Diagnosis not present

## 2023-09-30 DIAGNOSIS — R030 Elevated blood-pressure reading, without diagnosis of hypertension: Secondary | ICD-10-CM | POA: Diagnosis not present

## 2023-09-30 DIAGNOSIS — R058 Other specified cough: Secondary | ICD-10-CM | POA: Diagnosis not present

## 2023-10-09 DIAGNOSIS — Z87891 Personal history of nicotine dependence: Secondary | ICD-10-CM | POA: Diagnosis not present

## 2023-10-16 DIAGNOSIS — R942 Abnormal results of pulmonary function studies: Secondary | ICD-10-CM | POA: Diagnosis not present

## 2023-10-16 DIAGNOSIS — J449 Chronic obstructive pulmonary disease, unspecified: Secondary | ICD-10-CM | POA: Diagnosis not present

## 2023-10-16 DIAGNOSIS — F17210 Nicotine dependence, cigarettes, uncomplicated: Secondary | ICD-10-CM | POA: Diagnosis not present

## 2023-10-17 DIAGNOSIS — K648 Other hemorrhoids: Secondary | ICD-10-CM | POA: Diagnosis not present

## 2023-10-17 DIAGNOSIS — K5909 Other constipation: Secondary | ICD-10-CM | POA: Diagnosis not present

## 2023-10-25 DIAGNOSIS — M545 Low back pain, unspecified: Secondary | ICD-10-CM | POA: Diagnosis not present

## 2023-10-25 DIAGNOSIS — J449 Chronic obstructive pulmonary disease, unspecified: Secondary | ICD-10-CM | POA: Diagnosis not present

## 2023-10-25 DIAGNOSIS — F17210 Nicotine dependence, cigarettes, uncomplicated: Secondary | ICD-10-CM | POA: Diagnosis not present

## 2023-10-25 DIAGNOSIS — I10 Essential (primary) hypertension: Secondary | ICD-10-CM | POA: Diagnosis not present

## 2023-10-25 DIAGNOSIS — G4733 Obstructive sleep apnea (adult) (pediatric): Secondary | ICD-10-CM | POA: Diagnosis not present

## 2023-10-28 DIAGNOSIS — M792 Neuralgia and neuritis, unspecified: Secondary | ICD-10-CM | POA: Diagnosis not present

## 2023-10-28 DIAGNOSIS — M25562 Pain in left knee: Secondary | ICD-10-CM | POA: Diagnosis not present

## 2023-10-28 DIAGNOSIS — M545 Low back pain, unspecified: Secondary | ICD-10-CM | POA: Diagnosis not present

## 2023-10-28 DIAGNOSIS — M79671 Pain in right foot: Secondary | ICD-10-CM | POA: Diagnosis not present

## 2023-10-28 DIAGNOSIS — R030 Elevated blood-pressure reading, without diagnosis of hypertension: Secondary | ICD-10-CM | POA: Diagnosis not present

## 2023-10-28 DIAGNOSIS — G4733 Obstructive sleep apnea (adult) (pediatric): Secondary | ICD-10-CM | POA: Diagnosis not present

## 2023-10-28 DIAGNOSIS — J439 Emphysema, unspecified: Secondary | ICD-10-CM | POA: Diagnosis not present

## 2023-10-28 DIAGNOSIS — I10 Essential (primary) hypertension: Secondary | ICD-10-CM | POA: Diagnosis not present

## 2023-10-28 DIAGNOSIS — F17210 Nicotine dependence, cigarettes, uncomplicated: Secondary | ICD-10-CM | POA: Diagnosis not present

## 2023-10-28 DIAGNOSIS — M170 Bilateral primary osteoarthritis of knee: Secondary | ICD-10-CM | POA: Diagnosis not present

## 2023-10-28 DIAGNOSIS — R7303 Prediabetes: Secondary | ICD-10-CM | POA: Diagnosis not present

## 2023-11-03 DIAGNOSIS — G4733 Obstructive sleep apnea (adult) (pediatric): Secondary | ICD-10-CM | POA: Diagnosis not present

## 2024-02-23 ENCOUNTER — Telehealth: Payer: Self-pay | Admitting: Pharmacist

## 2024-02-23 NOTE — Telephone Encounter (Signed)
 Patient contacted for follow-up of adherence of Rosuvastatin  40 mg.  Since last contact patient wife reports that patient is incarcerated.   Total time with patient call and documentation of interaction: 8 minutes.
